# Patient Record
Sex: Female | Born: 1945
Health system: Southern US, Community
[De-identification: ages and names within clinical notes are randomized; demographics above are authoritative.]

## PROBLEM LIST (undated history)

## (undated) DIAGNOSIS — I10 Essential (primary) hypertension: Secondary | ICD-10-CM

## (undated) DIAGNOSIS — M51369 Other intervertebral disc degeneration, lumbar region without mention of lumbar back pain or lower extremity pain: Secondary | ICD-10-CM

## (undated) DIAGNOSIS — M5136 Other intervertebral disc degeneration, lumbar region: Secondary | ICD-10-CM

## (undated) DIAGNOSIS — E785 Hyperlipidemia, unspecified: Secondary | ICD-10-CM

## (undated) DIAGNOSIS — J45909 Unspecified asthma, uncomplicated: Secondary | ICD-10-CM

## (undated) DIAGNOSIS — G4730 Sleep apnea, unspecified: Secondary | ICD-10-CM

## (undated) DIAGNOSIS — M1990 Unspecified osteoarthritis, unspecified site: Secondary | ICD-10-CM

## (undated) HISTORY — DX: Other intervertebral disc degeneration, lumbar region: M51.36

## (undated) HISTORY — DX: Unspecified osteoarthritis, unspecified site: M19.90

## (undated) HISTORY — PX: COLONOSCOPY: SHX174

## (undated) HISTORY — PX: BREAST BIOPSY: SHX20

## (undated) HISTORY — PX: OTHER SURGICAL HISTORY: SHX169

## (undated) HISTORY — DX: Unspecified asthma, uncomplicated: J45.909

## (undated) HISTORY — DX: Essential (primary) hypertension: I10

## (undated) HISTORY — DX: Other intervertebral disc degeneration, lumbar region without mention of lumbar back pain or lower extremity pain: M51.369

## (undated) HISTORY — DX: Hyperlipidemia, unspecified: E78.5

## (undated) HISTORY — PX: DG THUMB LEFT HAND: HXRAD1658

---
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
## 2013-07-30 HISTORY — PX: BUNIONECTOMY: SHX129

## 2015-05-25 ENCOUNTER — Ambulatory Visit (HOSPITAL_COMMUNITY)
Admission: RE | Admit: 2015-05-25 | Discharge: 2015-05-25 | Disposition: A | Discharge status: Home / Self Care | Payer: BLUE CROSS/BLUE SHIELD | Source: Ambulatory Visit | Attending: Family Medicine | Admitting: Family Medicine

## 2015-05-25 ENCOUNTER — Other Ambulatory Visit (HOSPITAL_COMMUNITY): Payer: Self-pay | Admitting: Family Medicine

## 2015-05-25 DIAGNOSIS — Z87891 Personal history of nicotine dependence: Secondary | ICD-10-CM | POA: Diagnosis not present

## 2015-05-25 DIAGNOSIS — J449 Chronic obstructive pulmonary disease, unspecified: Secondary | ICD-10-CM | POA: Insufficient documentation

## 2015-06-07 ENCOUNTER — Other Ambulatory Visit (HOSPITAL_COMMUNITY): Payer: Self-pay | Admitting: Family Medicine

## 2015-06-07 DIAGNOSIS — Z1231 Encounter for screening mammogram for malignant neoplasm of breast: Secondary | ICD-10-CM

## 2015-06-08 ENCOUNTER — Ambulatory Visit (HOSPITAL_COMMUNITY): Payer: BLUE CROSS/BLUE SHIELD

## 2015-06-08 ENCOUNTER — Ambulatory Visit (HOSPITAL_COMMUNITY)
Admission: RE | Admit: 2015-06-08 | Discharge: 2015-06-08 | Disposition: A | Discharge status: Home / Self Care | Payer: BLUE CROSS/BLUE SHIELD | Source: Ambulatory Visit | Attending: Family Medicine | Admitting: Family Medicine

## 2015-06-08 DIAGNOSIS — Z1231 Encounter for screening mammogram for malignant neoplasm of breast: Secondary | ICD-10-CM | POA: Diagnosis not present

## 2015-06-14 ENCOUNTER — Other Ambulatory Visit: Payer: Self-pay | Admitting: Family Medicine

## 2015-06-14 DIAGNOSIS — R928 Other abnormal and inconclusive findings on diagnostic imaging of breast: Secondary | ICD-10-CM

## 2015-06-21 ENCOUNTER — Ambulatory Visit
Admission: RE | Admit: 2015-06-21 | Discharge: 2015-06-21 | Disposition: A | Discharge status: Home / Self Care | Payer: BLUE CROSS/BLUE SHIELD | Source: Ambulatory Visit | Attending: Family Medicine | Admitting: Family Medicine

## 2015-06-21 DIAGNOSIS — R928 Other abnormal and inconclusive findings on diagnostic imaging of breast: Secondary | ICD-10-CM

## 2015-07-05 ENCOUNTER — Ambulatory Visit: Payer: Self-pay | Admitting: Allergy and Immunology

## 2015-09-07 DIAGNOSIS — H578 Other specified disorders of eye and adnexa: Secondary | ICD-10-CM | POA: Diagnosis not present

## 2015-10-05 DIAGNOSIS — I10 Essential (primary) hypertension: Secondary | ICD-10-CM | POA: Diagnosis not present

## 2015-10-05 DIAGNOSIS — H01005 Unspecified blepharitis left lower eyelid: Secondary | ICD-10-CM | POA: Diagnosis not present

## 2015-10-05 DIAGNOSIS — E663 Overweight: Secondary | ICD-10-CM | POA: Diagnosis not present

## 2015-10-05 DIAGNOSIS — Z008 Encounter for other general examination: Secondary | ICD-10-CM | POA: Diagnosis not present

## 2015-10-05 DIAGNOSIS — E785 Hyperlipidemia, unspecified: Secondary | ICD-10-CM | POA: Diagnosis not present

## 2015-10-06 DIAGNOSIS — E785 Hyperlipidemia, unspecified: Secondary | ICD-10-CM | POA: Diagnosis not present

## 2015-10-06 DIAGNOSIS — E663 Overweight: Secondary | ICD-10-CM | POA: Diagnosis not present

## 2015-10-06 DIAGNOSIS — I10 Essential (primary) hypertension: Secondary | ICD-10-CM | POA: Diagnosis not present

## 2015-11-14 DIAGNOSIS — H6503 Acute serous otitis media, bilateral: Secondary | ICD-10-CM | POA: Diagnosis not present

## 2015-11-14 DIAGNOSIS — J00 Acute nasopharyngitis [common cold]: Secondary | ICD-10-CM | POA: Diagnosis not present

## 2016-01-04 DIAGNOSIS — I10 Essential (primary) hypertension: Secondary | ICD-10-CM | POA: Diagnosis not present

## 2016-01-04 DIAGNOSIS — G5601 Carpal tunnel syndrome, right upper limb: Secondary | ICD-10-CM | POA: Diagnosis not present

## 2016-01-04 DIAGNOSIS — M5441 Lumbago with sciatica, right side: Secondary | ICD-10-CM | POA: Diagnosis not present

## 2016-01-05 ENCOUNTER — Other Ambulatory Visit (HOSPITAL_COMMUNITY): Payer: Self-pay | Admitting: Family Medicine

## 2016-01-05 ENCOUNTER — Ambulatory Visit (HOSPITAL_COMMUNITY)
Admission: RE | Admit: 2016-01-05 | Discharge: 2016-01-05 | Disposition: A | Discharge status: Home / Self Care | Payer: PPO | Source: Ambulatory Visit | Attending: Family Medicine | Admitting: Family Medicine

## 2016-01-05 DIAGNOSIS — M5136 Other intervertebral disc degeneration, lumbar region: Secondary | ICD-10-CM | POA: Diagnosis not present

## 2016-01-05 DIAGNOSIS — R921 Mammographic calcification found on diagnostic imaging of breast: Secondary | ICD-10-CM

## 2016-01-05 DIAGNOSIS — M5441 Lumbago with sciatica, right side: Secondary | ICD-10-CM | POA: Diagnosis not present

## 2016-01-17 ENCOUNTER — Ambulatory Visit (HOSPITAL_COMMUNITY)
Admission: RE | Admit: 2016-01-17 | Discharge: 2016-01-17 | Disposition: A | Discharge status: Home / Self Care | Payer: PPO | Source: Ambulatory Visit | Attending: Family Medicine | Admitting: Family Medicine

## 2016-01-17 DIAGNOSIS — R921 Mammographic calcification found on diagnostic imaging of breast: Secondary | ICD-10-CM | POA: Diagnosis not present

## 2016-02-15 DIAGNOSIS — G5601 Carpal tunnel syndrome, right upper limb: Secondary | ICD-10-CM | POA: Diagnosis not present

## 2016-03-05 DIAGNOSIS — G5601 Carpal tunnel syndrome, right upper limb: Secondary | ICD-10-CM | POA: Diagnosis not present

## 2016-03-30 DIAGNOSIS — G5601 Carpal tunnel syndrome, right upper limb: Secondary | ICD-10-CM | POA: Diagnosis not present

## 2016-05-30 ENCOUNTER — Other Ambulatory Visit: Payer: Self-pay | Admitting: Family Medicine

## 2016-05-30 DIAGNOSIS — R921 Mammographic calcification found on diagnostic imaging of breast: Secondary | ICD-10-CM

## 2016-06-12 DIAGNOSIS — H40033 Anatomical narrow angle, bilateral: Secondary | ICD-10-CM | POA: Diagnosis not present

## 2016-06-12 DIAGNOSIS — H04123 Dry eye syndrome of bilateral lacrimal glands: Secondary | ICD-10-CM | POA: Diagnosis not present

## 2016-07-03 ENCOUNTER — Ambulatory Visit
Admission: RE | Admit: 2016-07-03 | Discharge: 2016-07-03 | Disposition: A | Discharge status: Home / Self Care | Payer: Medicare Other | Source: Ambulatory Visit | Attending: Family Medicine | Admitting: Family Medicine

## 2016-07-03 ENCOUNTER — Other Ambulatory Visit (HOSPITAL_COMMUNITY): Payer: Self-pay | Admitting: Family Medicine

## 2016-07-03 DIAGNOSIS — R921 Mammographic calcification found on diagnostic imaging of breast: Secondary | ICD-10-CM | POA: Diagnosis not present

## 2016-07-03 DIAGNOSIS — E785 Hyperlipidemia, unspecified: Secondary | ICD-10-CM | POA: Diagnosis not present

## 2016-07-03 DIAGNOSIS — E663 Overweight: Secondary | ICD-10-CM | POA: Diagnosis not present

## 2016-07-03 DIAGNOSIS — J4520 Mild intermittent asthma, uncomplicated: Secondary | ICD-10-CM | POA: Diagnosis not present

## 2016-07-03 DIAGNOSIS — Z780 Asymptomatic menopausal state: Secondary | ICD-10-CM

## 2016-07-03 DIAGNOSIS — Z23 Encounter for immunization: Secondary | ICD-10-CM | POA: Diagnosis not present

## 2016-07-03 DIAGNOSIS — I10 Essential (primary) hypertension: Secondary | ICD-10-CM | POA: Diagnosis not present

## 2016-07-11 ENCOUNTER — Other Ambulatory Visit (HOSPITAL_COMMUNITY): Payer: Self-pay | Admitting: Family Medicine

## 2016-07-11 ENCOUNTER — Ambulatory Visit (HOSPITAL_COMMUNITY)
Admission: RE | Admit: 2016-07-11 | Discharge: 2016-07-11 | Disposition: A | Discharge status: Home / Self Care | Payer: PPO | Source: Ambulatory Visit | Attending: Family Medicine | Admitting: Family Medicine

## 2016-07-11 DIAGNOSIS — M25561 Pain in right knee: Secondary | ICD-10-CM | POA: Insufficient documentation

## 2016-07-11 DIAGNOSIS — M25562 Pain in left knee: Principal | ICD-10-CM

## 2016-07-11 DIAGNOSIS — M170 Bilateral primary osteoarthritis of knee: Secondary | ICD-10-CM | POA: Insufficient documentation

## 2016-07-11 DIAGNOSIS — E785 Hyperlipidemia, unspecified: Secondary | ICD-10-CM | POA: Diagnosis not present

## 2016-07-11 DIAGNOSIS — Z780 Asymptomatic menopausal state: Secondary | ICD-10-CM | POA: Diagnosis not present

## 2016-07-11 DIAGNOSIS — I10 Essential (primary) hypertension: Secondary | ICD-10-CM | POA: Diagnosis not present

## 2016-07-11 DIAGNOSIS — E663 Overweight: Secondary | ICD-10-CM | POA: Diagnosis not present

## 2016-07-11 DIAGNOSIS — Z13820 Encounter for screening for osteoporosis: Secondary | ICD-10-CM | POA: Diagnosis not present

## 2016-07-11 DIAGNOSIS — Z008 Encounter for other general examination: Secondary | ICD-10-CM | POA: Diagnosis not present

## 2016-10-02 DIAGNOSIS — I10 Essential (primary) hypertension: Secondary | ICD-10-CM | POA: Diagnosis not present

## 2016-10-02 DIAGNOSIS — M170 Bilateral primary osteoarthritis of knee: Secondary | ICD-10-CM | POA: Diagnosis not present

## 2016-10-02 DIAGNOSIS — J4520 Mild intermittent asthma, uncomplicated: Secondary | ICD-10-CM | POA: Diagnosis not present

## 2016-10-02 DIAGNOSIS — E785 Hyperlipidemia, unspecified: Secondary | ICD-10-CM | POA: Diagnosis not present

## 2016-12-05 ENCOUNTER — Other Ambulatory Visit (HOSPITAL_COMMUNITY): Payer: Self-pay | Admitting: Family Medicine

## 2016-12-05 ENCOUNTER — Ambulatory Visit (HOSPITAL_COMMUNITY)
Admission: RE | Admit: 2016-12-05 | Discharge: 2016-12-05 | Disposition: A | Discharge status: Home / Self Care | Payer: PPO | Source: Ambulatory Visit | Attending: Family Medicine | Admitting: Family Medicine

## 2016-12-05 DIAGNOSIS — M79661 Pain in right lower leg: Secondary | ICD-10-CM | POA: Diagnosis not present

## 2016-12-05 DIAGNOSIS — R52 Pain, unspecified: Secondary | ICD-10-CM

## 2016-12-05 DIAGNOSIS — M1288 Other specific arthropathies, not elsewhere classified, other specified site: Secondary | ICD-10-CM | POA: Diagnosis not present

## 2016-12-05 DIAGNOSIS — M47817 Spondylosis without myelopathy or radiculopathy, lumbosacral region: Secondary | ICD-10-CM | POA: Insufficient documentation

## 2016-12-05 DIAGNOSIS — M1711 Unilateral primary osteoarthritis, right knee: Secondary | ICD-10-CM | POA: Insufficient documentation

## 2016-12-05 DIAGNOSIS — M1611 Unilateral primary osteoarthritis, right hip: Secondary | ICD-10-CM | POA: Diagnosis not present

## 2016-12-05 DIAGNOSIS — M545 Low back pain: Secondary | ICD-10-CM | POA: Diagnosis not present

## 2016-12-05 DIAGNOSIS — S1096XA Insect bite of unspecified part of neck, initial encounter: Secondary | ICD-10-CM | POA: Diagnosis not present

## 2016-12-05 DIAGNOSIS — G8929 Other chronic pain: Secondary | ICD-10-CM | POA: Insufficient documentation

## 2016-12-05 DIAGNOSIS — G5601 Carpal tunnel syndrome, right upper limb: Secondary | ICD-10-CM | POA: Diagnosis not present

## 2016-12-05 DIAGNOSIS — M160 Bilateral primary osteoarthritis of hip: Secondary | ICD-10-CM | POA: Diagnosis not present

## 2016-12-26 DIAGNOSIS — M5441 Lumbago with sciatica, right side: Secondary | ICD-10-CM | POA: Diagnosis not present

## 2016-12-26 DIAGNOSIS — R079 Chest pain, unspecified: Secondary | ICD-10-CM | POA: Diagnosis not present

## 2016-12-26 DIAGNOSIS — Z733 Stress, not elsewhere classified: Secondary | ICD-10-CM | POA: Diagnosis not present

## 2017-01-11 DIAGNOSIS — J309 Allergic rhinitis, unspecified: Secondary | ICD-10-CM | POA: Insufficient documentation

## 2017-01-11 DIAGNOSIS — E663 Overweight: Secondary | ICD-10-CM | POA: Insufficient documentation

## 2017-01-11 DIAGNOSIS — I10 Essential (primary) hypertension: Secondary | ICD-10-CM | POA: Insufficient documentation

## 2017-01-11 DIAGNOSIS — G5601 Carpal tunnel syndrome, right upper limb: Secondary | ICD-10-CM | POA: Insufficient documentation

## 2017-01-11 DIAGNOSIS — R079 Chest pain, unspecified: Secondary | ICD-10-CM

## 2017-01-11 DIAGNOSIS — M5441 Lumbago with sciatica, right side: Secondary | ICD-10-CM | POA: Insufficient documentation

## 2017-01-11 DIAGNOSIS — J4520 Mild intermittent asthma, uncomplicated: Secondary | ICD-10-CM | POA: Insufficient documentation

## 2017-01-11 DIAGNOSIS — M170 Bilateral primary osteoarthritis of knee: Secondary | ICD-10-CM | POA: Insufficient documentation

## 2017-01-11 DIAGNOSIS — E785 Hyperlipidemia, unspecified: Secondary | ICD-10-CM | POA: Insufficient documentation

## 2017-01-11 DIAGNOSIS — F439 Reaction to severe stress, unspecified: Secondary | ICD-10-CM | POA: Insufficient documentation

## 2017-01-11 DIAGNOSIS — E7800 Pure hypercholesterolemia, unspecified: Secondary | ICD-10-CM | POA: Insufficient documentation

## 2017-01-14 NOTE — Progress Notes (Signed)
Cardiology Office Note   Date:  01/15/2017   ID:  Jasmine Rhodes, DOB Dec 05, 1945, MRN [id number]  PCP:  Gareth MorganKnowlton, Steve, MD  Cardiologist:   Charlton HawsPeter Durward Matranga, MD   No chief complaint on file.     History of Present Illness: Jasmine Rhodes is a 71 y.o. female who presents for consultation regarding abnormal ECG Referred by Dr Sudie BaileyKnowlton.   CRF;s include HTN, HL, and central obesity. Reviewed his office notes from 12/26/16 Noted atypical SSCP.  Pain is fleeting Sharp pins/needles down left arm Some pain in legs with walking right greater than right but seems arthritic On prednisone Now for right knee pain  Tried to review ECG from his office but leads are not labelled and format is not the usual 12 lead. To my eye Major finding is low voltage In our office today ECG  NSR totally normal rate 68    [place] lived in [place] years moved here to care for her 70 yo mom   Past Medical History:  Diagnosis Date  . Hypertension     Past Surgical History:  Procedure Laterality Date  . COLONOSCOPY    . tonsils removal       Current Outpatient Prescriptions  Medication Sig Dispense Refill  . albuterol (PROVENTIL) (2.5 MG/3ML) 0.083% nebulizer solution Take 2.5 mg by nebulization every 6 (six) hours as needed for wheezing or shortness of breath.    Marland Kitchen. aspirin EC 81 MG tablet Take 81 mg by mouth daily.    Marland Kitchen. atenolol (TENORMIN) 50 MG tablet Take 50 mg by mouth daily.    . cetirizine (ZYRTEC) 10 MG tablet Take 10 mg by mouth daily.    . fluticasone (FLONASE) 50 MCG/ACT nasal spray Place into both nostrils daily.    Marland Kitchen. oxymetazoline (AFRIN) 0.05 % nasal spray Place 1 spray into both nostrils 2 (two) times daily.    . pravastatin (PRAVACHOL) 20 MG tablet Take 20 mg by mouth daily.    Marland Kitchen. triamterene-hydrochlorothiazide (DYAZIDE) 37.5-25 MG capsule Take 1 capsule by mouth daily.     No current facility-administered medications for this visit.     Allergies:   Sulfa antibiotics    Social  History:  The patient  reports that she has been smoking.  She has never used smokeless tobacco. She reports that she drinks alcohol. She reports that she does not use drugs.   Family History:  The patient's family history includes Hypertension in her mother. Maternal grandma With CVA    ROS:  Please see the history of present illness.   Otherwise, review of systems are positive for none.   All other systems are reviewed and negative.    PHYSICAL EXAM: VS:  BP 128/78 (BP Location: Left Arm)   Pulse 74   Ht 5\' 5"  (1.651 m)   Wt 70.8 kg (156 lb)   SpO2 99%   BMI 25.96 kg/m  , BMI Body mass index is 25.96 kg/m. Affect appropriate Healthy:  appears stated age HEENT: normal Neck supple with no adenopathy JVP normal no bruits no thyromegaly Lungs clear with no wheezing and good diaphragmatic motion Heart:  S1/S2 no murmur, no rub, gallop or click PMI normal Abdomen: benighn, BS positve, no tenderness, no AAA no bruit.  No HSM or HJR Distal pulses intact with no bruits No edema Neuro non-focal Skin warm and dry No muscular weakness    EKG:  See HPI above 01/15/17 in our office    Recent Labs: No results found  for requested labs within last 8760 hours.    Lipid Panel No results found for: CHOL, TRIG, HDL, CHOLHDL, VLDL, LDLCALC, LDLDIRECT    Wt Readings from Last 3 Encounters:  01/15/17 70.8 kg (156 lb)      Other studies Reviewed: Additional studies/ records that were reviewed today include: Notes Dr Sudie Bailey and ECG.    ASSESSMENT AND PLAN:  1.  Abnormal ECG ? Related to office equipment Our ECG is totally normal  She is interested in ETT which is simple and thinks she can walk on treadmill to r/o CAD 2. HTN Well controlled.  Continue current medications and low sodium Dash type diet.   3. Cholesterol continue statin labs with primary  4. Obesity  Discussed low carb diet and walking    Current medicines are reviewed at length with the patient today.  The  patient does not have concerns regarding medicines.  The following changes have been made:  no change  Labs/ tests ordered today include: ETT  Orders Placed This Encounter  Procedures  . Exercise Tolerance Test  . EKG 12-Lead     Disposition:   FU with cardiology PRN     Signed, Charlton Haws, MD  01/15/2017 9:32 AM    [facility] [address] [place], [place], [place]  [address] Phone: [phone]; Fax: [phone]

## 2017-01-15 ENCOUNTER — Ambulatory Visit (INDEPENDENT_AMBULATORY_CARE_PROVIDER_SITE_OTHER): Payer: PPO | Admitting: Cardiovascular Disease

## 2017-01-15 ENCOUNTER — Encounter: Payer: Self-pay | Admitting: Cardiovascular Disease

## 2017-01-15 VITALS — BP 128/78 | HR 74 | Ht 65.0 in | Wt 156.0 lb

## 2017-01-15 DIAGNOSIS — I10 Essential (primary) hypertension: Secondary | ICD-10-CM | POA: Diagnosis not present

## 2017-01-15 DIAGNOSIS — R9431 Abnormal electrocardiogram [ECG] [EKG]: Secondary | ICD-10-CM | POA: Diagnosis not present

## 2017-01-15 NOTE — Patient Instructions (Signed)
Medication Instructions:  Your physician recommends that you continue on your current medications as directed. Please refer to the Current Medication list given to you today.   Labwork: NONE  Testing/Procedures: Your physician has requested that you have an exercise tolerance test. For further information please visit [email or web address]. Please also follow instruction sheet, as given.    Follow-Up: Your physician recommends that you schedule a follow-up appointment in: AS NEEDED    Any Other Special Instructions Will Be Listed Below (If Applicable).     If you need a refill on your cardiac medications before your next appointment, please call your pharmacy.   

## 2017-01-16 ENCOUNTER — Telehealth: Payer: Self-pay

## 2017-01-16 ENCOUNTER — Ambulatory Visit (HOSPITAL_COMMUNITY)
Admission: RE | Admit: 2017-01-16 | Discharge: 2017-01-16 | Disposition: A | Discharge status: Home / Self Care | Payer: PPO | Source: Ambulatory Visit | Attending: Cardiovascular Disease | Admitting: Cardiovascular Disease

## 2017-01-16 DIAGNOSIS — R9431 Abnormal electrocardiogram [ECG] [EKG]: Secondary | ICD-10-CM | POA: Insufficient documentation

## 2017-01-16 DIAGNOSIS — I10 Essential (primary) hypertension: Secondary | ICD-10-CM | POA: Insufficient documentation

## 2017-01-16 LAB — EXERCISE TOLERANCE TEST
CHL CUP RESTING HR STRESS: 81 {beats}/min
CSEPED: 6 min
CSEPEDS: 1 s
CSEPEW: 7 METS
CSEPHR: 100 %
CSEPPHR: 151 {beats}/min
MPHR: 151 {beats}/min
RPE: 13

## 2017-01-16 NOTE — Telephone Encounter (Signed)
Called pt, no answer- left  Message for pt to return call.

## 2017-01-29 ENCOUNTER — Telehealth: Payer: Self-pay

## 2017-02-19 DIAGNOSIS — R1010 Upper abdominal pain, unspecified: Secondary | ICD-10-CM | POA: Diagnosis not present

## 2017-02-19 DIAGNOSIS — J301 Allergic rhinitis due to pollen: Secondary | ICD-10-CM | POA: Diagnosis not present

## 2017-02-19 DIAGNOSIS — G4733 Obstructive sleep apnea (adult) (pediatric): Secondary | ICD-10-CM | POA: Diagnosis not present

## 2017-02-19 DIAGNOSIS — F17200 Nicotine dependence, unspecified, uncomplicated: Secondary | ICD-10-CM | POA: Diagnosis not present

## 2017-02-20 DIAGNOSIS — J301 Allergic rhinitis due to pollen: Secondary | ICD-10-CM | POA: Diagnosis not present

## 2017-02-20 DIAGNOSIS — I10 Essential (primary) hypertension: Secondary | ICD-10-CM | POA: Diagnosis not present

## 2017-02-20 DIAGNOSIS — R1010 Upper abdominal pain, unspecified: Secondary | ICD-10-CM | POA: Diagnosis not present

## 2017-02-20 DIAGNOSIS — E785 Hyperlipidemia, unspecified: Secondary | ICD-10-CM | POA: Diagnosis not present

## 2017-03-06 DIAGNOSIS — G5610 Other lesions of median nerve, unspecified upper limb: Secondary | ICD-10-CM | POA: Diagnosis not present

## 2017-03-11 ENCOUNTER — Other Ambulatory Visit: Payer: Self-pay

## 2017-03-11 ENCOUNTER — Encounter: Payer: Self-pay | Admitting: Gastroenterology

## 2017-03-11 ENCOUNTER — Ambulatory Visit (INDEPENDENT_AMBULATORY_CARE_PROVIDER_SITE_OTHER): Payer: PPO | Admitting: Gastroenterology

## 2017-03-11 ENCOUNTER — Ambulatory Visit: Payer: PPO | Admitting: Nurse Practitioner

## 2017-03-11 DIAGNOSIS — R1013 Epigastric pain: Secondary | ICD-10-CM | POA: Diagnosis not present

## 2017-03-11 DIAGNOSIS — R1031 Right lower quadrant pain: Secondary | ICD-10-CM | POA: Diagnosis not present

## 2017-03-11 DIAGNOSIS — Z860100 Personal history of colon polyps, unspecified: Secondary | ICD-10-CM

## 2017-03-11 DIAGNOSIS — M5441 Lumbago with sciatica, right side: Secondary | ICD-10-CM | POA: Diagnosis not present

## 2017-03-11 DIAGNOSIS — R1319 Other dysphagia: Secondary | ICD-10-CM

## 2017-03-11 DIAGNOSIS — R1310 Dysphagia, unspecified: Secondary | ICD-10-CM

## 2017-03-11 DIAGNOSIS — G8929 Other chronic pain: Secondary | ICD-10-CM | POA: Insufficient documentation

## 2017-03-11 MED ORDER — NA SULFATE-K SULFATE-MG SULF 17.5-3.13-1.6 GM/177ML PO SOLN: ORAL | 0 refills | Status: DC

## 2017-03-11 NOTE — Patient Instructions (Signed)
1. Colonoscopy and upper endoscopy as scheduled. See separate instructions.  

## 2017-03-11 NOTE — Progress Notes (Addendum)
REVIEWED-NO ADDITIONAL RECOMMENDATIONS.  Primary Care Physician:  Gareth MorganKnowlton, Steve, MD  Primary Gastroenterologist:  Jonette EvaSandi Fields, MD   Chief Complaint  Patient presents with  . Colonoscopy    consult  . Abdominal Pain    right lower, intermittent  . Nausea    HPI:  Jasmine Rhodes is a 71 y.o. female here to schedule colonoscopy. She's had some right lower quadrant pain as well as some upper GI symptoms. She states her last colonoscopy was around 2005, but when she called to get records they could find one only in 2001. She believes she had colon polyps with her first one. Either way sounds like she is due.  She's had intermittent right lower quadrant abdominal pain over the past several months. Unrelated to meals or BMs. May last for minutes to hours at a time, not all day. Bowel movements are regular. Sometimes has a lot of gas. No melena rectal bleeding. She also complains of epigastric discomfort with meals. Solid food esophageal dysphagia. No vomiting. She's had some nausea. No heartburn. Remote EGD.  Takes aspirin daily but no other NSAIDs on a regular basis. She takes Aleve on occasion for knee and back pain but can go couple months in between dosages.  Recent labs were good. See below.  Current Outpatient Prescriptions  Medication Sig Dispense Refill  . albuterol (PROVENTIL) (2.5 MG/3ML) 0.083% nebulizer solution Take 2.5 mg by nebulization every 6 (six) hours as needed for wheezing or shortness of breath.    Marland Kitchen. aspirin EC 81 MG tablet Take 81 mg by mouth daily.    Marland Kitchen. atenolol (TENORMIN) 50 MG tablet Take 50 mg by mouth daily.    . cetirizine (ZYRTEC) 10 MG tablet Take 10 mg by mouth as needed.     . fluticasone (FLONASE) 50 MCG/ACT nasal spray Place into both nostrils as needed.     Marland Kitchen. GLUCOSAMINE-CHONDROITIN PO Take by mouth as needed.    . naproxen sodium (ANAPROX) 220 MG tablet Take 220 mg by mouth as needed.    Marland Kitchen. oxymetazoline (AFRIN) 0.05 % nasal spray Place 1 spray into  both nostrils as needed.     . pravastatin (PRAVACHOL) 20 MG tablet Take 20 mg by mouth daily.    Marland Kitchen. triamterene-hydrochlorothiazide (DYAZIDE) 37.5-25 MG capsule Take 1 capsule by mouth daily.     No current facility-administered medications for this visit.     Allergies as of 03/11/2017 - Review Complete 03/11/2017  Allergen Reaction Noted  . Sulfa antibiotics  01/11/2017    Past Medical History:  Diagnosis Date  . Asthma   . DDD (degenerative disc disease), lumbar   . Hyperlipidemia   . Hypertension   . Osteoarthritis     Past Surgical History:  Procedure Laterality Date  . COLONOSCOPY    . DG THUMB LEFT HAND    . tonsils removal      Family History  Problem Relation Age of Onset  . Hypertension Mother   . Colon cancer Neg Hx     Social History   Social History  . Marital status: Divorced    Spouse name: N/A  . Number of children: N/A  . Years of education: N/A   Occupational History  . Not on file.   Social History Main Topics  . Smoking status: Current Every Day Smoker  . Smokeless tobacco: Never Used  . Alcohol use Yes     Comment: occasional  . Drug use: No  . Sexual activity: Not on file  Other Topics Concern  . Not on file   Social History Narrative  . No narrative on file      ROS:  General: Negative for anorexia, weight loss, fever, chills, fatigue, weakness. Eyes: Negative for vision changes.  ENT: Negative for hoarseness, difficulty swallowing , nasal congestion. CV: Negative for chest pain, angina, palpitations, dyspnea on exertion, peripheral edema.  Respiratory: Negative for dyspnea at rest, dyspnea on exertion, cough, sputum, wheezing.  GI: See history of present illness. GU:  Negative for dysuria, hematuria, urinary incontinence, urinary frequency, nocturnal urination.  MS: Negative for joint pain, low back pain.  Derm: Negative for rash or itching.  Neuro: Negative for weakness, abnormal sensation, seizure, frequent headaches,  memory loss, confusion.  Psych: Negative for anxiety, depression, suicidal ideation, hallucinations.  Endo: Negative for unusual weight change.  Heme: Negative for bruising or bleeding. Allergy: Negative for rash or hives.    Physical Examination:  BP 113/74   Pulse 69   Temp (!) 97.1 F (36.2 C) (Oral)   Ht 5\' 5"  (1.651 m)   Wt 153 lb 3.2 oz (69.5 kg)   BMI 25.49 kg/m    General: Well-nourished, well-developed in no acute distress.  Head: Normocephalic, atraumatic.   Eyes: Conjunctiva pink, no icterus. Mouth: Oropharyngeal mucosa moist and pink , no lesions erythema or exudate. Neck: Supple without thyromegaly, masses, or lymphadenopathy.  Lungs: Clear to auscultation bilaterally.  Heart: Regular rate and rhythm, no murmurs rubs or gallops.  Abdomen: Bowel sounds are normal, mild epigastric tenderness, nondistended, no hepatosplenomegaly or masses, no abdominal bruits or    hernia , no rebound or guarding.   Rectal: Not performed Extremities: No lower extremity edema. No clubbing or deformities.  Neuro: Alert and oriented x 4 , grossly normal neurologically.  Skin: Warm and dry, no rash or jaundice.   Psych: Alert and cooperative, normal mood and affect.  Labs: Labs from 02/20/2017 BUN 19, creatinine 0.69, glucose 102, total bilirubin 0.4, alkaline phosphatase 52, AST 14, ALT 9, albumin 3.9, sedimentation rate 11, white blood cell count 4800, hemoglobin 14, hematocrit 41.3, platelets 252,000. H. pylori breath test not detected.  Imaging Studies: No results found.  Impression/plan: 71 year old female presenting with complaints of intermittent right lower quadrant pain, postprandial epigastric discomfort, esophageal dysphagia with prior history of colon polyps. Recent labs unremarkable. No reported unintentional weight loss. No significant right lower quadrant pain on exam today, appendicitis less likely. Overdue for colonoscopy.  Plan for colonoscopy in the near future. Plan  for upper endoscopy with possible esophageal dilation given postprandial epigastric discomfort and esophageal dysphagia to solid foods.  I have discussed the risks, alternatives, benefits with regards to but not limited to the risk of reaction to medication, bleeding, infection, perforation and the patient is agreeable to proceed. Written consent to be obtained.

## 2017-03-12 NOTE — Progress Notes (Signed)
cc'ed to pcp °

## 2017-03-26 ENCOUNTER — Ambulatory Visit: Payer: PPO | Admitting: Nurse Practitioner

## 2017-03-27 ENCOUNTER — Other Ambulatory Visit (HOSPITAL_COMMUNITY): Payer: Self-pay | Admitting: Family Medicine

## 2017-03-27 DIAGNOSIS — R1031 Right lower quadrant pain: Secondary | ICD-10-CM

## 2017-03-27 DIAGNOSIS — M5441 Lumbago with sciatica, right side: Principal | ICD-10-CM

## 2017-03-27 DIAGNOSIS — G8929 Other chronic pain: Secondary | ICD-10-CM

## 2017-03-30 HISTORY — PX: CARPAL TUNNEL RELEASE: SHX101

## 2017-04-10 ENCOUNTER — Ambulatory Visit (HOSPITAL_COMMUNITY): Payer: PPO

## 2017-04-11 DIAGNOSIS — G5601 Carpal tunnel syndrome, right upper limb: Secondary | ICD-10-CM | POA: Diagnosis not present

## 2017-04-16 ENCOUNTER — Ambulatory Visit (HOSPITAL_COMMUNITY)
Admission: RE | Admit: 2017-04-16 | Discharge: 2017-04-16 | Disposition: A | Discharge status: Home / Self Care | Payer: PPO | Source: Ambulatory Visit | Attending: Family Medicine | Admitting: Family Medicine

## 2017-04-16 DIAGNOSIS — I700 Atherosclerosis of aorta: Secondary | ICD-10-CM | POA: Diagnosis not present

## 2017-04-16 DIAGNOSIS — R1031 Right lower quadrant pain: Secondary | ICD-10-CM | POA: Diagnosis not present

## 2017-04-16 DIAGNOSIS — M48061 Spinal stenosis, lumbar region without neurogenic claudication: Secondary | ICD-10-CM | POA: Diagnosis not present

## 2017-04-16 DIAGNOSIS — M5137 Other intervertebral disc degeneration, lumbosacral region: Secondary | ICD-10-CM | POA: Diagnosis not present

## 2017-04-16 DIAGNOSIS — K760 Fatty (change of) liver, not elsewhere classified: Secondary | ICD-10-CM | POA: Insufficient documentation

## 2017-04-16 DIAGNOSIS — M5441 Lumbago with sciatica, right side: Principal | ICD-10-CM

## 2017-04-16 DIAGNOSIS — G8929 Other chronic pain: Secondary | ICD-10-CM

## 2017-04-16 DIAGNOSIS — I2510 Atherosclerotic heart disease of native coronary artery without angina pectoris: Secondary | ICD-10-CM | POA: Insufficient documentation

## 2017-04-16 LAB — POCT I-STAT CREATININE: Creatinine, Ser: 0.7 mg/dL (ref 0.44–1.00)

## 2017-04-16 MED ORDER — IOPAMIDOL (ISOVUE-300) INJECTION 61%
100.0000 mL | Freq: Once | INTRAVENOUS | Status: AC | PRN
  Administered 2017-04-16: 100 mL via INTRAVENOUS

## 2017-04-25 ENCOUNTER — Encounter (HOSPITAL_COMMUNITY)
Admission: RE | Disposition: A | Discharge status: Home / Self Care | Payer: Self-pay | Source: Ambulatory Visit | Attending: Gastroenterology

## 2017-04-25 ENCOUNTER — Ambulatory Visit (HOSPITAL_COMMUNITY)
Admission: RE | Admit: 2017-04-25 | Discharge: 2017-04-25 | Disposition: A | Discharge status: Home / Self Care | Payer: PPO | Source: Ambulatory Visit | Attending: Gastroenterology | Admitting: Gastroenterology

## 2017-04-25 ENCOUNTER — Encounter (HOSPITAL_COMMUNITY): Payer: Self-pay | Admitting: *Deleted

## 2017-04-25 DIAGNOSIS — K2990 Gastroduodenitis, unspecified, without bleeding: Secondary | ICD-10-CM

## 2017-04-25 DIAGNOSIS — E785 Hyperlipidemia, unspecified: Secondary | ICD-10-CM | POA: Diagnosis not present

## 2017-04-25 DIAGNOSIS — K648 Other hemorrhoids: Secondary | ICD-10-CM | POA: Diagnosis not present

## 2017-04-25 DIAGNOSIS — M1990 Unspecified osteoarthritis, unspecified site: Secondary | ICD-10-CM | POA: Diagnosis not present

## 2017-04-25 DIAGNOSIS — K644 Residual hemorrhoidal skin tags: Secondary | ICD-10-CM | POA: Diagnosis not present

## 2017-04-25 DIAGNOSIS — K2980 Duodenitis without bleeding: Secondary | ICD-10-CM | POA: Insufficient documentation

## 2017-04-25 DIAGNOSIS — F17210 Nicotine dependence, cigarettes, uncomplicated: Secondary | ICD-10-CM | POA: Insufficient documentation

## 2017-04-25 DIAGNOSIS — Z8719 Personal history of other diseases of the digestive system: Secondary | ICD-10-CM | POA: Diagnosis not present

## 2017-04-25 DIAGNOSIS — Z1212 Encounter for screening for malignant neoplasm of rectum: Secondary | ICD-10-CM

## 2017-04-25 DIAGNOSIS — R1310 Dysphagia, unspecified: Secondary | ICD-10-CM

## 2017-04-25 DIAGNOSIS — Z882 Allergy status to sulfonamides status: Secondary | ICD-10-CM | POA: Insufficient documentation

## 2017-04-25 DIAGNOSIS — Q438 Other specified congenital malformations of intestine: Secondary | ICD-10-CM | POA: Insufficient documentation

## 2017-04-25 DIAGNOSIS — Z79891 Long term (current) use of opiate analgesic: Secondary | ICD-10-CM | POA: Diagnosis not present

## 2017-04-25 DIAGNOSIS — I10 Essential (primary) hypertension: Secondary | ICD-10-CM | POA: Diagnosis not present

## 2017-04-25 DIAGNOSIS — Z79899 Other long term (current) drug therapy: Secondary | ICD-10-CM | POA: Diagnosis not present

## 2017-04-25 DIAGNOSIS — R1031 Right lower quadrant pain: Secondary | ICD-10-CM

## 2017-04-25 DIAGNOSIS — Z1211 Encounter for screening for malignant neoplasm of colon: Secondary | ICD-10-CM

## 2017-04-25 DIAGNOSIS — K2950 Unspecified chronic gastritis without bleeding: Secondary | ICD-10-CM | POA: Insufficient documentation

## 2017-04-25 DIAGNOSIS — K2970 Gastritis, unspecified, without bleeding: Secondary | ICD-10-CM

## 2017-04-25 DIAGNOSIS — J45909 Unspecified asthma, uncomplicated: Secondary | ICD-10-CM | POA: Diagnosis not present

## 2017-04-25 DIAGNOSIS — Z7982 Long term (current) use of aspirin: Secondary | ICD-10-CM | POA: Diagnosis not present

## 2017-04-25 DIAGNOSIS — Z86010 Personal history of colonic polyps: Secondary | ICD-10-CM

## 2017-04-25 DIAGNOSIS — K222 Esophageal obstruction: Secondary | ICD-10-CM

## 2017-04-25 HISTORY — PX: COLONOSCOPY: SHX5424

## 2017-04-25 HISTORY — PX: SAVORY DILATION: SHX5439

## 2017-04-25 HISTORY — PX: ESOPHAGOGASTRODUODENOSCOPY: SHX5428

## 2017-04-25 SURGERY — COLONOSCOPY
Anesthesia: Moderate Sedation

## 2017-04-25 MED ORDER — MEPERIDINE HCL 100 MG/ML IJ SOLN
INTRAMUSCULAR | Status: AC
  Filled 2017-04-25: qty 2

## 2017-04-25 MED ORDER — MIDAZOLAM HCL 5 MG/5ML IJ SOLN
INTRAMUSCULAR | Status: AC
  Filled 2017-04-25: qty 10

## 2017-04-25 MED ORDER — LIDOCAINE VISCOUS 2 % MT SOLN
OROMUCOSAL | Status: DC | PRN
  Administered 2017-04-25: 1 via OROMUCOSAL

## 2017-04-25 MED ORDER — MIDAZOLAM HCL 5 MG/5ML IJ SOLN
INTRAMUSCULAR | Status: DC | PRN
  Administered 2017-04-25: 2 mg via INTRAVENOUS
  Administered 2017-04-25 (×2): 1 mg via INTRAVENOUS
  Administered 2017-04-25: 2 mg via INTRAVENOUS
  Administered 2017-04-25: 1 mg via INTRAVENOUS

## 2017-04-25 MED ORDER — SODIUM CHLORIDE 0.9 % IV SOLN
INTRAVENOUS | Status: DC
  Administered 2017-04-25: 12:00:00 via INTRAVENOUS

## 2017-04-25 MED ORDER — OMEPRAZOLE 20 MG PO CPDR: DELAYED_RELEASE_CAPSULE | ORAL | 3 refills | Status: DC

## 2017-04-25 MED ORDER — MINERAL OIL PO OIL
TOPICAL_OIL | ORAL | Status: AC
  Filled 2017-04-25: qty 30

## 2017-04-25 MED ORDER — LIDOCAINE VISCOUS 2 % MT SOLN
OROMUCOSAL | Status: AC
  Filled 2017-04-25: qty 15

## 2017-04-25 MED ORDER — MEPERIDINE HCL 100 MG/ML IJ SOLN
INTRAMUSCULAR | Status: DC | PRN
  Administered 2017-04-25 (×4): 25 mg via INTRAVENOUS

## 2017-04-25 NOTE — Discharge Instructions (Signed)
YOU DID NOT HAVE ANY POLYPS. YOU HAVE DIVERTICULOSIS IN YOUR LEFT COLON. I STRETCHED YOUR ESOPHAGUS DUE AN ESOPHAGEAL STRICTURE.  YOUR PROBLEM SWALLOWING IS DUE TO ACID REFLUX, and THE STRICTURE, WHICH IS ALSO DUE TO ACID REFLUX. YOU HAVE GASTRITIS AND DUODENITIS due to aspirin and naproxen use. YOU HAVE A small HIATAL HERNIA. I BIOPSIED YOUR STOMACH.     DRINK WATER TO KEEP YOUR URINE LIGHT YELLOW.  FOLLOW A HIGH FIBER/LOW FAT DIET. AVOID ITEMS THAT CAUSE BLOATING. SEE INFO BELOW.  YOUR BIOPSY RESULTS WILL BE AVAILABLE IN MY CHART AFTER OCT 2 AND MY OFFICE WILL CONTACT YOU IN 10-14 DAYS WITH YOUR RESULTS.   Follow up in DEC 2018.  Dec 5th @ 10:30 am  Next colonoscopy in 10-15 years IF THE BENEFITS OUTWEIGH THE RISKS.    ENDOSCOPY Care After Read the instructions outlined below and refer to this sheet in the next week. These discharge instructions provide you with general information on caring for yourself after you leave the hospital. While your treatment has been planned according to the most current medical practices available, unavoidable complications occasionally occur. If you have any problems or questions after discharge, call DR. FIELDS, [phone].  ACTIVITY  You may resume your regular activity, but move at a slower pace for the next 24 hours.   Take frequent rest periods for the next 24 hours.   Walking will help get rid of the air and reduce the bloated feeling in your belly (abdomen).   No driving for 24 hours (because of the medicine (anesthesia) used during the test).   You may shower.   Do not sign any important legal documents or operate any machinery for 24 hours (because of the anesthesia used during the test).    NUTRITION  Drink plenty of fluids.   You may resume your normal diet as instructed by your doctor.   Begin with a light meal and progress to your normal diet. Heavy or fried foods are harder to digest and may make you feel sick to your stomach  (nauseated).   Avoid alcoholic beverages for 24 hours or as instructed.    MEDICATIONS  You may resume your normal medications.   WHAT YOU CAN EXPECT TODAY  Some feelings of bloating in the abdomen.   Passage of more gas than usual.   Spotting of blood in your stool or on the toilet paper  .  IF YOU HAD POLYPS REMOVED DURING THE ENDOSCOPY:  Eat a soft diet IF YOU HAVE NAUSEA, BLOATING, ABDOMINAL PAIN, OR VOMITING.    FINDING OUT THE RESULTS OF YOUR TEST Not all test results are available during your visit. DR. Darrick Penna WILL CALL YOU WITHIN 14 DAYS OF YOUR PROCEDUE WITH YOUR RESULTS. Do not assume everything is normal if you have not heard from DR. FIELDS, CALL HER OFFICE AT [phone].  SEEK IMMEDIATE MEDICAL ATTENTION AND CALL THE OFFICE: [phone] IF:  You have more than a spotting of blood in your stool.   Your belly is swollen (abdominal distention).   You are nauseated or vomiting.   You have a temperature over 101F.   You have abdominal pain or discomfort that is severe or gets worse throughout the day.   Low-Fat Diet BREADS, CEREALS, PASTA, RICE, DRIED PEAS, AND BEANS These products are high in carbohydrates and most are low in fat. Therefore, they can be increased in the diet as substitutes for fatty foods. They too, however, contain calories and should not be eaten  in excess. Cereals can be eaten for snacks as well as for breakfast.   FRUITS AND VEGETABLES It is good to eat fruits and vegetables. Besides being sources of fiber, both are rich in vitamins and some minerals. They help you get the daily allowances of these nutrients. Fruits and vegetables can be used for snacks and desserts.  MEATS Limit lean meat, chicken, [place], and fish to no more than 6 ounces per day. Beef, Pork, and Lamb Use lean cuts of beef, pork, and lamb. Lean cuts include:  Extra-lean ground beef.  Arm roast.  Sirloin tip.  Center-cut ham.  Round steak.  Loin chops.  Rump  roast.  Tenderloin.  Trim all fat off the outside of meats before cooking. It is not necessary to severely decrease the intake of red meat, but lean choices should be made. Lean meat is rich in protein and contains a highly absorbable form of iron. Premenopausal women, in particular, should avoid reducing lean red meat because this could increase the risk for low red blood cells (iron-deficiency anemia).  Chicken and [place] These are good sources of protein. The fat of poultry can be reduced by removing the skin and underlying fat layers before cooking. Chicken and [place] can be substituted for lean red meat in the diet. Poultry should not be fried or covered with high-fat sauces. Fish and Shellfish Fish is a good source of protein. Shellfish contain cholesterol, but they usually are low in saturated fatty acids. The preparation of fish is important. Like chicken and [place], they should not be fried or covered with high-fat sauces. EGGS Egg whites contain no fat or cholesterol. They can be eaten often. Try 1 to 2 egg whites instead of whole eggs in recipes or use egg substitutes that do not contain yolk. MILK AND DAIRY PRODUCTS Use skim or 1% milk instead of 2% or whole milk. Decrease whole milk, natural, and processed cheeses. Use nonfat or low-fat (2%) cottage cheese or low-fat cheeses made from vegetable oils. Choose nonfat or low-fat (1 to 2%) yogurt. Experiment with evaporated skim milk in recipes that call for heavy cream. Substitute low-fat yogurt or low-fat cottage cheese for sour cream in dips and salad dressings. Have at least 2 servings of low-fat dairy products, such as 2 glasses of skim (or 1%) milk each day to help get your daily calcium intake. FATS AND OILS Reduce the total intake of fats, especially saturated fat. Butterfat, lard, and beef fats are high in saturated fat and cholesterol. These should be avoided as much as possible. Vegetable fats do not contain cholesterol, but certain  vegetable fats, such as coconut oil, palm oil, and palm kernel oil are very high in saturated fats. These should be limited. These fats are often used in bakery goods, processed foods, popcorn, oils, and nondairy creamers. Vegetable shortenings and some peanut butters contain hydrogenated oils, which are also saturated fats. Read the labels on these foods and check for saturated vegetable oils. Unsaturated vegetable oils and fats do not raise blood cholesterol. However, they should be limited because they are fats and are high in calories. Total fat should still be limited to 30% of your daily caloric intake. Desirable liquid vegetable oils are corn oil, cottonseed oil, olive oil, canola oil, safflower oil, soybean oil, and sunflower oil. Peanut oil is not as good, but small amounts are acceptable. Buy a heart-healthy tub margarine that has no partially hydrogenated oils in the ingredients. Mayonnaise and salad dressings often are made from  unsaturated fats, but they should also be limited because of their high calorie and fat content. Seeds, nuts, peanut butter, olives, and avocados are high in fat, but the fat is mainly the unsaturated type. These foods should be limited mainly to avoid excess calories and fat. OTHER EATING TIPS Snacks  Most sweets should be limited as snacks. They tend to be rich in calories and fats, and their caloric content outweighs their nutritional value. Some good choices in snacks are graham crackers, melba toast, soda crackers, bagels (no egg), English muffins, fruits, and vegetables. These snacks are preferable to snack crackers, [place] fries, TORTILLA CHIPS, and POTATO chips. Popcorn should be air-popped or cooked in small amounts of liquid vegetable oil. Desserts Eat fruit, low-fat yogurt, and fruit ices instead of pastries, cake, and cookies. Sherbet, angel food cake, gelatin dessert, frozen low-fat yogurt, or other frozen products that do not contain saturated fat (pure fruit  juice bars, frozen ice pops) are also acceptable.  COOKING METHODS Choose those methods that use little or no fat. They include: Poaching.  Braising.  Steaming.  Grilling.  Baking.  Stir-frying.  Broiling.  Microwaving.  Foods can be cooked in a nonstick pan without added fat, or use a nonfat cooking spray in regular cookware. Limit fried foods and avoid frying in saturated fat. Add moisture to lean meats by using water, broth, cooking wines, and other nonfat or low-fat sauces along with the cooking methods mentioned above. Soups and stews should be chilled after cooking. The fat that forms on top after a few hours in the refrigerator should be skimmed off. When preparing meals, avoid using excess salt. Salt can contribute to raising blood pressure in some people.  EATING AWAY FROM HOME Order entres, potatoes, and vegetables without sauces or butter. When meat exceeds the size of a deck of cards (3 to 4 ounces), the rest can be taken home for another meal. Choose vegetable or fruit salads and ask for low-calorie salad dressings to be served on the side. Use dressings sparingly. Limit high-fat toppings, such as bacon, crumbled eggs, cheese, sunflower seeds, and olives. Ask for heart-healthy tub margarine instead of butter.  High-Fiber Diet A high-fiber diet changes your normal diet to include more whole grains, legumes, fruits, and vegetables. Changes in the diet involve replacing refined carbohydrates with unrefined foods. The calorie level of the diet is essentially unchanged. The Dietary Reference Intake (recommended amount) for adult males is 38 grams per day. For adult females, it is 25 grams per day. Pregnant and lactating women should consume 28 grams of fiber per day. Fiber is the intact part of a plant that is not broken down during digestion. Functional fiber is fiber that has been isolated from the plant to provide a beneficial effect in the body. PURPOSE  Increase stool bulk.    Ease and regulate bowel movements.   Lower cholesterol.   REDUCE RISK OF COLON CANCER  INDICATIONS THAT YOU NEED MORE FIBER  Constipation and hemorrhoids.   Uncomplicated diverticulosis (intestine condition) and irritable bowel syndrome.   Weight management.   As a protective measure against hardening of the arteries (atherosclerosis), diabetes, and cancer.   GUIDELINES FOR INCREASING FIBER IN THE DIET  Start adding fiber to the diet slowly. A gradual increase of about 5 more grams (2 slices of whole-wheat bread, 2 servings of most fruits or vegetables, or 1 bowl of high-fiber cereal) per day is best. Too rapid an increase in fiber may result in constipation, flatulence,  and bloating.   Drink enough water and fluids to keep your urine clear or pale yellow. Water, juice, or caffeine-free drinks are recommended. Not drinking enough fluid may cause constipation.   Eat a variety of high-fiber foods rather than one type of fiber.   Try to increase your intake of fiber through using high-fiber foods rather than fiber pills or supplements that contain small amounts of fiber.   The goal is to change the types of food eaten. Do not supplement your present diet with high-fiber foods, but replace foods in your present diet.   INCLUDE A VARIETY OF FIBER SOURCES  Replace refined and processed grains with whole grains, canned fruits with fresh fruits, and incorporate other fiber sources. White rice, white breads, and most bakery goods contain little or no fiber.   Brown whole-grain rice, buckwheat oats, and many fruits and vegetables are all good sources of fiber. These include: broccoli, Brussels sprouts, cabbage, cauliflower, beets, sweet potatoes, white potatoes (skin on), carrots, tomatoes, eggplant, squash, berries, fresh fruits, and dried fruits.   Cereals appear to be the richest source of fiber. Cereal fiber is found in whole grains and bran. Bran is the fiber-rich outer coat of  cereal grain, which is largely removed in refining. In whole-grain cereals, the bran remains. In breakfast cereals, the largest amount of fiber is found in those with "bran" in their names. The fiber content is sometimes indicated on the label.   You may need to include additional fruits and vegetables each day.   In baking, for 1 cup white flour, you may use the following substitutions:   1 cup whole-wheat flour minus 2 tablespoons.   1/2 cup white flour plus 1/2 cup whole-wheat flour.   Hiatal Hernia A hiatal hernia occurs when a part of the stomach slides above the diaphragm. The diaphragm is the thin muscle separating the belly (abdomen) from the chest. A hiatal hernia can be something you are born with or develop over time. Hiatal hernias may allow stomach acid to flow back into your esophagus, the tube which carries food from your mouth to your stomach. If this acid causes problems it is called GERD (gastro-esophageal reflux disease).   SYMPTOMS Common symptoms of GERD are heartburn (burning in your chest). This is worse when lying down or bending over. It may also cause belching and indigestion. Some of the things which make GERD worse are:  Increased weight pushes on stomach making acid rise more easily.   Smoking markedly increases acid production.   Alcohol decreases lower esophageal sphincter pressure (valve between stomach and esophagus), allowing acid from stomach into esophagus.   Late evening meals and going to bed with a full stomach increases pressure.   HOME CARE INSTRUCTIONS  Try to achieve and maintain an ideal body weight.   Avoid drinking alcoholic beverages.   Stop smoking.   Put the head of your bed on 4 to 6 inch blocks. This will keep your head and esophagus higher than your stomach. If you cannot use blocks, sleep with several pillows under your head and shoulders.   MINIMIZE THE USE OF aspirin, ibuprofen (Advil or Motrin), or other nonsteroidal  anti-inflammatory drugs.   Do not wear tight clothing around your chest or stomach.   Eat smaller meals and eat more frequently. This keeps your stomach from getting too full. Eat slowly.   Do not lie down for 2 or 3 hours after eating. Do not eat or drink anything 1 to 2  hours before going to bed.   Avoid caffeine beverages (colas, coffee, cocoa, tea), fatty foods, citrus fruits and all other foods and drinks that contain acid and that seem to increase the problems.   Avoid bending over, especially after eating. Also avoid straining during bowel movements or when urinating or lifting things. Anything that increases the pressure in your belly increases the amount of acid that may be pushed up into your esophagus.   Gastritis/DUODENITIS  Gastritis is an inflammation (the body's way of reacting to injury and/or infection) of the stomach. DUODENITIS is an inflammation (the body's way of reacting to injury and/or infection) of the FIRST PART OF THE SMALL INTESTINES. It is often caused by bacterial (germ) infections. It can also be caused BY ASPIRIN, BC/GOODY POWDER'S, (IBUPROFEN) MOTRIN, OR ALEVE (NAPROXEN), chemicals (including alcohol), SPICY FOODS, and medications. This illness may be associated with generalized malaise (feeling tired, not well), UPPER ABDOMINAL STOMACH cramps, and fever. One common bacterial cause of gastritis is an organism known as H. Pylori. This can be treated with antibiotics.

## 2017-04-25 NOTE — H&P (Signed)
Primary Care Physician:  Lemmie Evens, MD Primary Gastroenterologist:  Dr. Oneida Alar  Pre-Procedure History & Physical: HPI:  Jasmine Rhodes is a 71 y.o. female here for DYSPHAGIA/DYSPEPSIA/PERSONAL HISTORY OF POLYPS.  Past Medical History:  Diagnosis Date  . Asthma   . DDD (degenerative disc disease), lumbar   . Hyperlipidemia   . Hypertension   . Osteoarthritis     Past Surgical History:  Procedure Laterality Date  . COLONOSCOPY    . DG THUMB LEFT HAND    . tonsils removal      Prior to Admission medications   Medication Sig Start Date End Date Taking? Authorizing Provider  Ascorbic Acid (VITAMIN C PO) Take 1 tablet by mouth daily.   Yes [provider]  aspirin EC 81 MG tablet Take 81 mg by mouth daily.   Yes [provider]  atenolol (TENORMIN) 50 MG tablet Take 50 mg by mouth daily.   Yes [provider]  cetirizine (ZYRTEC) 10 MG tablet Take 10 mg by mouth daily as needed.    Yes [provider]  Cyanocobalamin (VITAMIN B-12 PO) Take 1 tablet by mouth daily.   Yes [provider]  HYDROcodone-acetaminophen (NORCO/VICODIN) 5-325 MG tablet Take 1-2 tablets by mouth every 4 (four) hours as needed. 04/11/17  Yes [provider]  Polyethyl Glycol-Propyl Glycol (SYSTANE ULTRA OP) Apply 1 drop to eye 2 (two) times daily as needed.   Yes [provider]  pravastatin (PRAVACHOL) 20 MG tablet Take 20 mg by mouth daily.   Yes [provider]  Pyridoxine HCl (VITAMIN B-6 PO) Take 1 tablet by mouth daily.   Yes [provider]  triamterene-hydrochlorothiazide (DYAZIDE) 37.5-25 MG capsule Take 1 capsule by mouth daily.   Yes [provider]  albuterol (PROVENTIL) (2.5 MG/3ML) 0.083% nebulizer solution Take 2.5 mg by nebulization every 6 (six) hours as needed for wheezing or shortness of breath.    [provider]  Cholecalciferol (VITAMIN D PO) Take 1 tablet by mouth daily.    [provider]  fluticasone (FLONASE) 50 MCG/ACT nasal spray Place 1 spray into both nostrils as needed for allergies.     [provider]  Na Sulfate-K Sulfate-Mg Sulf (SUPREP BOWEL PREP KIT) 17.5-3.13-1.6 GM/180ML SOLN 1 kit as directed 03/11/17   Mahala Menghini, PA-C  naproxen sodium (ANAPROX) 220 MG tablet Take 220 mg by mouth as needed.    [provider]    Allergies as of 03/11/2017 - Review Complete 03/11/2017  Allergen Reaction Noted  . Sulfa antibiotics Rash 01/11/2017    Family History  Problem Relation Age of Onset  . Hypertension Mother   . Colon cancer Neg Hx     Social History   Social History  . Marital status: Divorced    Spouse name: N/A  . Number of children: N/A  . Years of education: N/A   Occupational History  . Not on file.   Social History Main Topics  . Smoking status: Current Every Day Smoker  . Smokeless tobacco: Never Used  . Alcohol use Yes     Comment: occasional  . Drug use: No  . Sexual activity: Not on file   Other Topics Concern  . Not on file   Social History Narrative  . No narrative on file    Review of Systems: See HPI, otherwise negative ROS   Physical Exam: BP 109/72   Pulse 73   Temp 98 F (36.7 C) (Oral)   Resp  14   Ht 5' 5"  (1.651 m)   Wt 145 lb (65.8 kg)   SpO2 100%   BMI 24.13 kg/m  General:   Alert,  pleasant and cooperative in NAD Head:  Normocephalic and atraumatic. Neck:  Supple; Lungs:  Clear throughout to auscultation.    Heart:  Regular rate and rhythm. Abdomen:  Soft, nontender and nondistended. Normal bowel sounds, without guarding, and without rebound.   Neurologic:  Alert and  oriented x4;  grossly normal neurologically.  Impression/Plan:     DYSPHAGIA/DYSPEPSIA/PERSONAL HISTORY OF POLYPS.   PLAN:  EGD/DIL/tcs TODAY DISCUSSED PROCEDURE, BENEFITS, & RISKS: < 1% chance of medication reaction, bleeding, perforation, or rupture of spleen/liver.

## 2017-04-25 NOTE — Op Note (Signed)
[facility] Patient Name: Jasmine Rhodes Procedure Date: 04/25/2017 10:52 AM MRN: [id number] Date of Birth: 1946/05/20 Attending MD: Jonette Eva MD, MD CSN: [id number] Age: 71 Admit Type: Outpatient Procedure:                Colonoscopy, SCREENING Indications:              Screening for colorectal malignant neoplasm Providers:                Jonette Eva MD, MD, Nena Polio, RN, Dyann Ruddle Referring MD:             Gareth Morgan Medicines:                Meperidine 50 mg IV, Midazolam 5 mg IV Complications:            No immediate complications. Estimated Blood Loss:     Estimated blood loss: none. Procedure:                Pre-Anesthesia Assessment:                           - Prior to the procedure, a History and Physical                            was performed, and patient medications and                            allergies were reviewed. The patient's tolerance of                            previous anesthesia was also reviewed. The risks                            and benefits of the procedure and the sedation                            options and risks were discussed with the patient.                            All questions were answered, and informed consent                            was obtained. Prior Anticoagulants: The patient has                            taken aspirin, last dose was 1 day prior to                            procedure. ASA Grade Assessment: II - A patient                            with mild systemic disease. After reviewing the                            risks and benefits, the patient was deemed in  satisfactory condition to undergo the procedure.                            After obtaining informed consent, the colonoscope                            was passed under direct vision. Throughout the                            procedure, the patient's blood pressure, pulse, and                            oxygen  saturations were monitored continuously. The                            [id number] ([id number]) scope was introduced through                            the anus and advanced to the 5 cm into the ileum.                            The colonoscopy was technically difficult and                            complex due to significant looping. Successful                            completion of the procedure was aided by increasing                            the dose of sedation medication, straightening and                            shortening the scope to obtain bowel loop reduction                            and COLOWRAP. The patient tolerated the procedure                            fairly well. The quality of the bowel preparation                            was excellent. The terminal ileum, ileocecal valve,                            appendiceal orifice, and rectum were photographed. Scope In: 12:26:27 PM Scope Out: 12:41:18 PM Scope Withdrawal Time: 0 hours 11 minutes 1 second  Total Procedure Duration: 0 hours 14 minutes 51 seconds  Findings:      The terminal ileum appeared normal.      The recto-sigmoid colon, sigmoid colon and descending colon were       significantly redundant.      External and internal hemorrhoids were found during retroflexion.      A few large-mouthed  diverticula were found in the sigmoid colon and       descending colon. Impression:               - The examined portion of the ileum was normal.                           - Redundant LEFT colon.                           - The examination was otherwise normal.                           - External and internal hemorrhoids. Moderate Sedation:      Moderate (conscious) sedation was administered by the endoscopy nurse       and supervised by the endoscopist. The following parameters were       monitored: oxygen saturation, heart rate, blood pressure, and response       to care. Total physician intraservice time was 44  minutes. Recommendation:           - High fiber diet.                           - Continue present medications.                           - Patient has a contact number available for                            emergencies. The signs and symptoms of potential                            delayed complications were discussed with the                            patient. Return to normal activities tomorrow.                            Written discharge instructions were provided to the                            patient.                           - No repeat colonoscopy due to age. Procedure Code(s):        --- Professional ---                           [phone], Colonoscopy, flexible; diagnostic, including                            collection of specimen(s) by brushing or washing,                            when performed (separate procedure)  41324, Moderate sedation services provided by the                            same physician or other qualified health care                            professional performing the diagnostic or                            therapeutic service that the sedation supports,                            requiring the presence of an independent trained                            observer to assist in the monitoring of the                            patient's level of consciousness and physiological                            status; initial 15 minutes of intraservice time,                            patient age 23 years or older                           [phone], Moderate sedation services; each additional                            15 minutes intraservice time                           99153, Moderate sedation services; each additional                            15 minutes intraservice time Diagnosis Code(s):        --- Professional ---                           Z12.11, Encounter for screening for malignant                            neoplasm  of colon                           K64.8, Other hemorrhoids                           Q43.8, Other specified congenital malformations of                            intestine CPT copyright 2016 [employer]. All rights reserved. The codes documented in this report are preliminary and upon coder review may  be revised to meet current compliance requirements. Jonette Eva, MD Jonette Eva  MD, MD 04/25/2017 1:06:53 PM This report has been signed electronically. Number of Addenda: 0

## 2017-04-25 NOTE — Op Note (Signed)
[facility] Patient Name: Jasmine Rhodes Procedure Date: 04/25/2017 12:43 PM MRN: [id number] Date of Birth: 10-01-1945 Attending MD: Jonette Eva MD, MD CSN: [id number] Age: 71 Admit Type: Outpatient Procedure:                Upper GI endoscopy WITH COLD FORCEPS                            BIOPSY/ESOPHAGEAL DILATION Indications:              Epigastric abdominal pain, Dysphagia Providers:                Jonette Eva MD, MD, Nena Polio, RN, Dyann Ruddle Referring MD:             Gareth Morgan Medicines:                Meperidine 50 mg IV, Midazolam 2 mg IV Complications:            No immediate complications. Estimated Blood Loss:     Estimated blood loss was minimal. Procedure:                Pre-Anesthesia Assessment:                           - Prior to the procedure, a History and Physical                            was performed, and patient medications and                            allergies were reviewed. The patient's tolerance of                            previous anesthesia was also reviewed. The risks                            and benefits of the procedure and the sedation                            options and risks were discussed with the patient.                            All questions were answered, and informed consent                            was obtained. Prior Anticoagulants: The patient has                            taken aspirin, last dose was 1 day prior to                            procedure. ASA Grade Assessment: II - A patient                            with mild systemic disease. After reviewing the  risks and benefits, the patient was deemed in                            satisfactory condition to undergo the procedure.                           After obtaining informed consent, the endoscope was                            passed under direct vision. Throughout the                            procedure, the patient's  blood pressure, pulse, and                            oxygen saturations were monitored continuously. The                            [id number]) scope was introduced through the                            mouth, and advanced to the second part of duodenum.                            The upper GI endoscopy was accomplished without                            difficulty. The patient tolerated the procedure                            fairly well. Scope In: 12:47:51 PM Scope Out: 12:58:00 PM Total Procedure Duration: 0 hours 10 minutes 9 seconds  Findings:      A moderate Schatzki ring (acquired) was found at the gastroesophageal       junction. A guidewire was placed and the scope was withdrawn. Dilation       was performed with a Savary dilator with mild resistance at 15 mm, 16 mm       and 17 mm.      Patchy mild inflammation characterized by congestion (edema), erosions       and erythema was found in the gastric fundus and in the gastric antrum.       Biopsies were taken with a cold forceps for histology.      Patchy mild inflammation characterized by congestion (edema) and       erythema was found in the duodenal bulb.      The second portion of the duodenum was normal. Impression:               - Moderate Schatzki ring. Dilated.                           - EPIGASTRIC PAIN DUE TO NSAID Gastritis/Duodenitis. Moderate Sedation:      Moderate (conscious) sedation was administered by the endoscopy nurse       and supervised by the endoscopist. The following parameters were       monitored: oxygen saturation,  heart rate, blood pressure, and response       to care. Total physician intraservice time was 44 minutes. Recommendation:           - Await pathology results.                           - High fiber diet and low fat diet.                           - Continue present medications.                           - Use Prilosec (omeprazole) 20 mg PO daily.                           -  Return to my office in 3 months.                           - Patient has a contact number available for                            emergencies. The signs and symptoms of potential                            delayed complications were discussed with the                            patient. Return to normal activities tomorrow.                            Written discharge instructions were provided to the                            patient. Procedure Code(s):        --- Professional ---                           [phone], Esophagogastroduodenoscopy, flexible,                            transoral; with insertion of guide wire followed by                            passage of dilator(s) through esophagus over guide                            wire                           43239, Esophagogastroduodenoscopy, flexible,                            transoral; with biopsy, single or multiple                           99152, Moderate sedation services provided by the  same physician or other qualified health care                            professional performing the diagnostic or                            therapeutic service that the sedation supports,                            requiring the presence of an independent trained                            observer to assist in the monitoring of the                            patient's level of consciousness and physiological                            status; initial 15 minutes of intraservice time,                            patient age 70 years or older                           [phone], Moderate sedation services; each additional                            15 minutes intraservice time                           99153, Moderate sedation services; each additional                            15 minutes intraservice time Diagnosis Code(s):        --- Professional ---                           K22.2, Esophageal obstruction                            K29.70, Gastritis, unspecified, without bleeding                           K29.80, Duodenitis without bleeding                           R10.13, Epigastric pain                           R13.10, Dysphagia, unspecified CPT copyright 2016 American Medical Association. All rights reserved. The codes documented in this report are preliminary and upon coder review may  be revised to meet current compliance requirements. Jonette Eva, MD Jonette Eva MD, MD 04/25/2017 1:11:07 PM This report has been signed electronically. Number of Addenda: 0

## 2017-04-26 DIAGNOSIS — G5601 Carpal tunnel syndrome, right upper limb: Secondary | ICD-10-CM | POA: Diagnosis not present

## 2017-04-29 ENCOUNTER — Encounter (HOSPITAL_COMMUNITY): Payer: Self-pay | Admitting: Gastroenterology

## 2017-05-03 DIAGNOSIS — G5601 Carpal tunnel syndrome, right upper limb: Secondary | ICD-10-CM | POA: Diagnosis not present

## 2017-05-08 ENCOUNTER — Telehealth: Payer: Self-pay | Admitting: Gastroenterology

## 2017-05-08 NOTE — Telephone Encounter (Signed)
Please call pt. HER stomach Bx shows gastritis DUE TO ASA AND NAPROXEN.    TO TREAT GASTRITIS & DUODENITIS, CONTINUE OMEPRAZOLE.  TAKE 30 MINUTES PRIOR TO YOUR FIST MEAL.  FOLLOW A HIGH FIBER/LOW FAT DIET. AVOID ITEMS THAT CAUSE BLOATING.   Follow up Dec 5th @ 10:30 am

## 2017-05-09 DIAGNOSIS — G5601 Carpal tunnel syndrome, right upper limb: Secondary | ICD-10-CM | POA: Diagnosis not present

## 2017-05-13 NOTE — Telephone Encounter (Signed)
Called, many rings and no answer. Mailing a letter to call for results.

## 2017-05-17 DIAGNOSIS — G5601 Carpal tunnel syndrome, right upper limb: Secondary | ICD-10-CM | POA: Diagnosis not present

## 2017-05-20 ENCOUNTER — Telehealth: Payer: Self-pay | Admitting: Gastroenterology

## 2017-05-20 NOTE — Telephone Encounter (Signed)
Spoke with pt, pt was notified of her results. Pt was concerned about the hernia seen on the report. Pt notified that hernia is currently small and no surgical procedures are needed at this time. Pt will call back if she experiences any symptoms concerning the hernia.

## 2017-05-20 NOTE — Telephone Encounter (Signed)
Pt called to say that she received a letter from DS that she was unable to reach her by phone and to call about her results and plan of care. Patient would like someone to call her back ASAP. She is aware of her PP FU in Dec. Please call [phone]

## 2017-05-24 DIAGNOSIS — G5601 Carpal tunnel syndrome, right upper limb: Secondary | ICD-10-CM | POA: Diagnosis not present

## 2017-05-27 DIAGNOSIS — G5601 Carpal tunnel syndrome, right upper limb: Secondary | ICD-10-CM | POA: Diagnosis not present

## 2017-06-03 DIAGNOSIS — G5601 Carpal tunnel syndrome, right upper limb: Secondary | ICD-10-CM | POA: Diagnosis not present

## 2017-06-05 DIAGNOSIS — G5601 Carpal tunnel syndrome, right upper limb: Secondary | ICD-10-CM | POA: Diagnosis not present

## 2017-06-07 DIAGNOSIS — G5601 Carpal tunnel syndrome, right upper limb: Secondary | ICD-10-CM | POA: Diagnosis not present

## 2017-06-19 DIAGNOSIS — G5601 Carpal tunnel syndrome, right upper limb: Secondary | ICD-10-CM | POA: Diagnosis not present

## 2017-07-01 DIAGNOSIS — Z23 Encounter for immunization: Secondary | ICD-10-CM | POA: Diagnosis not present

## 2017-07-01 DIAGNOSIS — I10 Essential (primary) hypertension: Secondary | ICD-10-CM | POA: Diagnosis not present

## 2017-07-01 DIAGNOSIS — E785 Hyperlipidemia, unspecified: Secondary | ICD-10-CM | POA: Diagnosis not present

## 2017-07-01 DIAGNOSIS — J302 Other seasonal allergic rhinitis: Secondary | ICD-10-CM | POA: Diagnosis not present

## 2017-07-01 DIAGNOSIS — R630 Anorexia: Secondary | ICD-10-CM | POA: Diagnosis not present

## 2017-07-03 ENCOUNTER — Ambulatory Visit: Payer: PPO | Admitting: Nurse Practitioner

## 2017-07-03 ENCOUNTER — Encounter: Payer: Self-pay | Admitting: Nurse Practitioner

## 2017-07-03 VITALS — BP 111/69 | HR 65 | Temp 97.5°F | Ht 65.0 in | Wt 144.0 lb

## 2017-07-03 DIAGNOSIS — R1310 Dysphagia, unspecified: Secondary | ICD-10-CM | POA: Diagnosis not present

## 2017-07-03 DIAGNOSIS — K2990 Gastroduodenitis, unspecified, without bleeding: Secondary | ICD-10-CM | POA: Diagnosis not present

## 2017-07-03 DIAGNOSIS — R1319 Other dysphagia: Secondary | ICD-10-CM

## 2017-07-03 DIAGNOSIS — R1013 Epigastric pain: Secondary | ICD-10-CM | POA: Diagnosis not present

## 2017-07-03 DIAGNOSIS — K2970 Gastritis, unspecified, without bleeding: Secondary | ICD-10-CM | POA: Diagnosis not present

## 2017-07-03 DIAGNOSIS — G8929 Other chronic pain: Secondary | ICD-10-CM

## 2017-07-03 DIAGNOSIS — R1031 Right lower quadrant pain: Secondary | ICD-10-CM | POA: Diagnosis not present

## 2017-07-03 NOTE — Patient Instructions (Signed)
1. I will look into a possible discrepancy between her discharge papers and procedure report. 2. If you do not hear from me within a week call us. 3. Continue your current medications, especially your Prilosec acid blocker. 4. Return for follow-up in 6 months. 5. Call us if you have any questions or concerns.

## 2017-07-03 NOTE — Assessment & Plan Note (Signed)
Dysphasia symptoms have resolved after dilation.  She is concerned that her discharge instructions indicated she had a hiatal hernia but her endoscopy report does not mention this.  I will find a link for her discharge instructions and rectify discrepancy and recommend corrections if needed.  Return for follow-up in 6 months.  Continue current medications.

## 2017-07-03 NOTE — Assessment & Plan Note (Signed)
Right lower quadrant pain resolved at this time.  Continue to monitor.  Return for follow-up in 6 months.

## 2017-07-03 NOTE — Progress Notes (Signed)
cc'd to pcp 

## 2017-07-03 NOTE — Assessment & Plan Note (Signed)
Gastritis symptoms have resolved at this time.  Continue to monitor, continue PPI and other prescription medications.  Return for follow-up in 6 months.

## 2017-07-03 NOTE — Progress Notes (Signed)
Referring Provider: Gareth MorganKnowlton, Steve, MD Primary Care Physician:  Gareth MorganKnowlton, Steve, MD Primary GI:  Dr. Darrick PennaFields  Chief Complaint  Patient presents with  . Dysphagia    pp f/u, doing ok    HPI:   Jasmine Rhodes is a 71 y.o. female who presents for post procedure follow-up.  The patient was last seen in our office 03/11/2017 for history of colon polyps, chronic right lower quadrant pain, abdominal pain epigastric, esophageal dysphasia.  At her last visit she admitted right lower quadrant pain and some upper GI symptoms.  Last colonoscopy was previously either 2001 or 2005.  They did find polyps at that time, per the patient's recollection.  Her pain was over the last several months, unrelated to meals or bowel movements and last for minutes to hours at a time.  Regular bowel movements, sometimes a lot of gas.  No rectal bleeding.  Epigastric discomfort with meals and some solid food dysphagia but without vomiting.  No traditional GERD symptoms.  Aleve on occasion, daily aspirin but no other NSAIDs.  Recommended colonoscopy and upper endoscopy.  Colonoscopy and EGD were completed 04/25/2017.  Colonoscopy found normal ileum, redundant left colon, otherwise normal.  External and internal hemorrhoids.  Recommended high-fiber diet, continue current medications.  EGD completed the same day found moderate Schatzki's ring status post dilation, epigastric pain due to NSAID gastritis/duodenitis.  Recommended Prilosec daily, avoid NSAIDs, return for follow-up in 3 months.  Today she states she's doing ok. Dysphagia is improved after dilation. GERD symptoms is better on Prilosec with no breakthrough symptoms. She has had decreasing appetite and would like help with her diet. Nutrition consult offered and accepted. RLQ abdominal pain is resolved currently. Denies abdominal pain, N/V, hematochezia, melena, fever, chills. Has had some unintentional weight loss (subjectively 30 lbs in the past 3 years) which she  attributes to stress and caregiving for her mom. Denies chest pain, dyspnea, dizziness, lightheadedness, syncope, near syncope. Denies any other upper or lower GI symptoms.  Past Medical History:  Diagnosis Date  . Asthma   . DDD (degenerative disc disease), lumbar   . Hyperlipidemia   . Hypertension   . Osteoarthritis     Past Surgical History:  Procedure Laterality Date  . COLONOSCOPY    . COLONOSCOPY N/A 04/25/2017   Procedure: COLONOSCOPY;  Surgeon: West BaliFields, Sandi L, MD;  Location: [facility];  Service: Endoscopy;  Laterality: N/A;  1200  . DG THUMB LEFT HAND    . ESOPHAGOGASTRODUODENOSCOPY N/A 04/25/2017   Procedure: ESOPHAGOGASTRODUODENOSCOPY (EGD);  Surgeon: West BaliFields, Sandi L, MD;  Location: [facility];  Service: Endoscopy;  Laterality: N/A;  . SAVORY DILATION N/A 04/25/2017   Procedure: SAVORY DILATION;  Surgeon: West BaliFields, Sandi L, MD;  Location: [facility];  Service: Endoscopy;  Laterality: N/A;  . tonsils removal      Current Outpatient Medications  Medication Sig Dispense Refill  . albuterol (PROVENTIL) (2.5 MG/3ML) 0.083% nebulizer solution Take 2.5 mg by nebulization every 6 (six) hours as needed for wheezing or shortness of breath.    . Ascorbic Acid (VITAMIN C PO) Take 1 tablet by mouth daily.    Marland Kitchen. aspirin EC 81 MG tablet Take 81 mg by mouth daily.    Marland Kitchen. atenolol (TENORMIN) 50 MG tablet Take 50 mg by mouth daily.    . cetirizine (ZYRTEC) 10 MG tablet Take 10 mg by mouth daily as needed.     . Cholecalciferol (VITAMIN D PO) Take 1 tablet by mouth daily.    .Marland Kitchen  Cyanocobalamin (VITAMIN B-12 PO) Take 1 tablet by mouth daily.    . fluticasone (FLONASE) 50 MCG/ACT nasal spray Place 1 spray into both nostrils as needed for allergies.     Marland Kitchen. HYDROcodone-acetaminophen (NORCO/VICODIN) 5-325 MG tablet Take 1-2 tablets by mouth every 4 (four) hours as needed.  0  . naproxen sodium (ANAPROX) 220 MG tablet Take 220 mg by mouth as needed.    Marland Kitchen. omeprazole (PRILOSEC) 20 MG capsule 1 PO  30 MINS PRIOR TO BREAKFAST. 90 capsule 3  . Polyethyl Glycol-Propyl Glycol (SYSTANE ULTRA OP) Apply 1 drop to eye 2 (two) times daily as needed.    . pravastatin (PRAVACHOL) 20 MG tablet Take 20 mg by mouth daily.    . Pyridoxine HCl (VITAMIN B-6 PO) Take 1 tablet by mouth daily.    Marland Kitchen. triamterene-hydrochlorothiazide (DYAZIDE) 37.5-25 MG capsule Take 1 capsule by mouth daily.     No current facility-administered medications for this visit.     Allergies as of 07/03/2017 - Review Complete 07/03/2017  Allergen Reaction Noted  . Sulfa antibiotics Rash 01/11/2017    Family History  Problem Relation Age of Onset  . Hypertension Mother   . Colon cancer Neg Hx     Social History   Socioeconomic History  . Marital status: Divorced    Spouse name: None  . Number of children: None  . Years of education: None  . Highest education level: None  Social Needs  . Financial resource strain: None  . Food insecurity - worry: None  . Food insecurity - inability: None  . Transportation needs - medical: None  . Transportation needs - non-medical: None  Occupational History  . None  Tobacco Use  . Smoking status: Current Every Day Smoker    Types: Cigarettes  . Smokeless tobacco: Never Used  Substance and Sexual Activity  . Alcohol use: Yes    Comment: occasional  . Drug use: No  . Sexual activity: None  Other Topics Concern  . None  Social History Narrative  . None    Review of Systems: Complete ROS negative except as per HPI.   Physical Exam: BP 111/69   Pulse 65   Temp (!) 97.5 F (36.4 C) (Oral)   Ht 5\' 5"  (1.651 m)   Wt 144 lb (65.3 kg)   BMI 23.96 kg/m  General:   Alert and oriented. Pleasant and cooperative. Well-nourished and well-developed.  Eyes:  Without icterus, sclera clear and conjunctiva pink.  Ears:  Normal auditory acuity. Cardiovascular:  S1, S2 present without murmurs appreciated. Extremities without clubbing or edema. Respiratory:  Clear to auscultation  bilaterally. No wheezes, rales, or rhonchi. No distress.  Gastrointestinal:  +BS, soft, non-tender and non-distended. No HSM noted. No guarding or rebound. No masses appreciated.  Rectal:  Deferred  Musculoskalatal:  Symmetrical without gross deformities. Neurologic:  Alert and oriented x4;  grossly normal neurologically. Psych:  Alert and cooperative. Normal mood and affect. Heme/Lymph/Immune: No excessive bruising noted.    07/03/2017 10:56 AM   Disclaimer: This note was dictated with voice recognition software. Similar sounding words can inadvertently be transcribed and may not be corrected upon review.

## 2017-07-03 NOTE — Assessment & Plan Note (Signed)
Epigastric pain has resolved at this time along with resolution of her gastritis symptoms.  Continue to monitor, continue PPI, follow-up in 6 months.

## 2017-08-26 ENCOUNTER — Other Ambulatory Visit: Payer: Self-pay | Admitting: Family Medicine

## 2017-08-26 DIAGNOSIS — R921 Mammographic calcification found on diagnostic imaging of breast: Secondary | ICD-10-CM

## 2017-09-03 DIAGNOSIS — H524 Presbyopia: Secondary | ICD-10-CM | POA: Diagnosis not present

## 2017-09-03 DIAGNOSIS — Z961 Presence of intraocular lens: Secondary | ICD-10-CM | POA: Diagnosis not present

## 2017-09-03 DIAGNOSIS — H52221 Regular astigmatism, right eye: Secondary | ICD-10-CM | POA: Diagnosis not present

## 2017-09-06 DIAGNOSIS — G4733 Obstructive sleep apnea (adult) (pediatric): Secondary | ICD-10-CM | POA: Diagnosis not present

## 2017-09-06 DIAGNOSIS — I10 Essential (primary) hypertension: Secondary | ICD-10-CM | POA: Diagnosis not present

## 2017-09-06 DIAGNOSIS — J209 Acute bronchitis, unspecified: Secondary | ICD-10-CM | POA: Diagnosis not present

## 2017-09-06 DIAGNOSIS — E663 Overweight: Secondary | ICD-10-CM | POA: Diagnosis not present

## 2017-09-06 DIAGNOSIS — E785 Hyperlipidemia, unspecified: Secondary | ICD-10-CM | POA: Diagnosis not present

## 2017-09-11 DIAGNOSIS — J209 Acute bronchitis, unspecified: Secondary | ICD-10-CM | POA: Diagnosis not present

## 2017-09-24 ENCOUNTER — Emergency Department (HOSPITAL_COMMUNITY)
Admission: EM | Admit: 2017-09-24 | Discharge: 2017-09-24 | Disposition: A | Discharge status: Home / Self Care | Payer: PPO | Attending: Emergency Medicine | Admitting: Emergency Medicine

## 2017-09-24 ENCOUNTER — Encounter (HOSPITAL_COMMUNITY): Payer: Self-pay | Admitting: Emergency Medicine

## 2017-09-24 ENCOUNTER — Other Ambulatory Visit: Payer: Self-pay

## 2017-09-24 ENCOUNTER — Emergency Department (HOSPITAL_COMMUNITY): Payer: PPO

## 2017-09-24 DIAGNOSIS — F17210 Nicotine dependence, cigarettes, uncomplicated: Secondary | ICD-10-CM | POA: Insufficient documentation

## 2017-09-24 DIAGNOSIS — J45909 Unspecified asthma, uncomplicated: Secondary | ICD-10-CM | POA: Insufficient documentation

## 2017-09-24 DIAGNOSIS — I10 Essential (primary) hypertension: Secondary | ICD-10-CM | POA: Diagnosis not present

## 2017-09-24 DIAGNOSIS — M542 Cervicalgia: Secondary | ICD-10-CM | POA: Diagnosis not present

## 2017-09-24 DIAGNOSIS — R291 Meningismus: Secondary | ICD-10-CM | POA: Diagnosis not present

## 2017-09-24 DIAGNOSIS — M7918 Myalgia, other site: Secondary | ICD-10-CM

## 2017-09-24 DIAGNOSIS — M25519 Pain in unspecified shoulder: Secondary | ICD-10-CM | POA: Diagnosis not present

## 2017-09-24 DIAGNOSIS — R05 Cough: Secondary | ICD-10-CM | POA: Diagnosis not present

## 2017-09-24 LAB — BASIC METABOLIC PANEL
ANION GAP: 10 (ref 5–15)
BUN: 11 mg/dL (ref 6–20)
CO2: 29 mmol/L (ref 22–32)
Calcium: 9.8 mg/dL (ref 8.9–10.3)
Chloride: 100 mmol/L — ABNORMAL LOW (ref 101–111)
Creatinine, Ser: 0.64 mg/dL (ref 0.44–1.00)
GFR calc Af Amer: >60 mL/min (ref >60)
GLUCOSE: 102 mg/dL — AB (ref 65–99)
POTASSIUM: 3.5 mmol/L (ref 3.5–5.1)
Sodium: 139 mmol/L (ref 135–145)

## 2017-09-24 LAB — CBC
HEMATOCRIT: 40 % (ref 36.0–46.0)
Hemoglobin: 13.4 g/dL (ref 12.0–15.0)
MCH: 31.1 pg (ref 26.0–34.0)
MCHC: 33.5 g/dL (ref 30.0–36.0)
MCV: 92.8 fL (ref 78.0–100.0)
PLATELETS: 218 10*3/uL (ref 150–400)
RBC: 4.31 MIL/uL (ref 3.87–5.11)
RDW: 11.9 % (ref 11.5–15.5)
WBC: 6.6 10*3/uL (ref 4.0–10.5)

## 2017-09-24 LAB — CK: Total CK: 40 U/L (ref 38–234)

## 2017-09-24 MED ORDER — METHOCARBAMOL 500 MG PO TABS
500.0000 mg | ORAL_TABLET | Freq: Once | ORAL | Status: AC
  Administered 2017-09-24: 500 mg via ORAL
  Filled 2017-09-24: qty 1

## 2017-09-24 MED ORDER — METHOCARBAMOL 500 MG PO TABS: 500.0000 mg | ORAL_TABLET | Freq: Three times a day (TID) | ORAL | 0 refills | Status: DC | PRN

## 2017-09-24 NOTE — Discharge Instructions (Signed)

## 2017-09-24 NOTE — ED Triage Notes (Signed)
Pt was sent by PCP for possible meningitis. Pt c/o of neck pain and chills since yesterday. Denies fever.

## 2017-09-24 NOTE — ED Notes (Signed)
Pt continues to c/o neck pain that goes into arms.  Pt no visible distress.

## 2017-09-24 NOTE — ED Provider Notes (Signed)
Emergency Department Provider Note   I have reviewed the triage vital signs and the nursing notes.   HISTORY  Chief Complaint Neck Pain   HPI Jasmine Rhodes is a 72 y.o. female with PMH of asthma, HLD, OA, and HTN presents to the emergency department for evaluation of neck/shoulder tightness/soreness.  Patient developed upper respiratory tract infection symptoms 2 weeks ago.  She is been on 2 rounds of antibiotics and felt lives of her respiratory symptoms are improving she states that 2 weeks ago she had a fever but none since. 2 days ago she developed stiffness in the neck, arms, and lower back.  She denies any recent fevers.  Yesterday she felt a slight chill with this has not recurred.  She does not have headache..  She states the soreness is slightly worse in the right arm. Denies any weakness/numbness in the extremities.    Past Medical History:  Diagnosis Date  . Asthma   . DDD (degenerative disc disease), lumbar   . Hyperlipidemia   . Hypertension   . Osteoarthritis     Patient Active Problem List   Diagnosis Date Noted  . Gastritis and gastroduodenitis   . History of colonic polyps 03/11/2017  . Chronic RLQ pain 03/11/2017  . Abdominal pain, epigastric 03/11/2017  . Esophageal dysphagia 03/11/2017  . HTN (hypertension) 01/11/2017  . Overweight 01/11/2017  . Hyperlipidemia 01/11/2017  . Allergic rhinitis 01/11/2017  . Mild intermittent asthma 01/11/2017  . Osteoarthritis of both knees 01/11/2017  . Carpal tunnel syndrome, right 01/11/2017  . Chest pain 01/11/2017  . Stress 01/11/2017  . Lumbago with sciatica, right side 01/11/2017    Past Surgical History:  Procedure Laterality Date  . COLONOSCOPY    . COLONOSCOPY N/A 04/25/2017   Procedure: COLONOSCOPY;  Surgeon: West Bali, MD;  Location: [facility];  Service: Endoscopy;  Laterality: N/A;  1200  . DG THUMB LEFT HAND    . ESOPHAGOGASTRODUODENOSCOPY N/A 04/25/2017   Procedure:  ESOPHAGOGASTRODUODENOSCOPY (EGD);  Surgeon: West Bali, MD;  Location: [facility];  Service: Endoscopy;  Laterality: N/A;  . SAVORY DILATION N/A 04/25/2017   Procedure: SAVORY DILATION;  Surgeon: West Bali, MD;  Location: [facility];  Service: Endoscopy;  Laterality: N/A;  . tonsils removal      Current Outpatient Rx  . Order #: [id number] Class: Historical Med  . Order #: [id number] Class: Historical Med  . Order #: [id number] Class: Historical Med  . Order #: [id number] Class: Historical Med  . Order #: [id number] Class: Historical Med  . Order #: [id number] Class: Historical Med  . Order #: [id number] Class: Historical Med  . Order #: [id number] Class: Historical Med  . Order #: [id number] Class: Historical Med  . Order #: [id number] Class: Print  . Order #: [id number] Class: Historical Med  . Order #: [id number] Class: Normal  . Order #: [id number] Class: Historical Med  . Order #: [id number] Class: Historical Med  . Order #: [id number] Class: Historical Med  . Order #: [id number] Class: Historical Med    Allergies Sulfa antibiotics  Family History  Problem Relation Age of Onset  . Hypertension Mother   . Colon cancer Neg Hx     Social History Social History   Tobacco Use  . Smoking status: Current Every Day Smoker    Types: Cigarettes  . Smokeless tobacco: Never Used  Substance Use Topics  . Alcohol use: Yes    Comment: occasional  . Drug use: No    Review of Systems  Constitutional: No fever. Chills x 2 (brief)  yesterday.  Eyes: No visual changes. ENT: No sore throat. Cardiovascular: Denies chest pain. Respiratory: Denies shortness of breath. Recent URI (resolved).  Gastrointestinal: No abdominal pain. No nausea, no vomiting.  No diarrhea.  No constipation. Genitourinary: Negative for dysuria. Musculoskeletal: Negative for back pain. Positive neck and shoulder pain.  Skin: Negative for rash.  Neurological: Negative for headaches, focal weakness or numbness.    10-point ROS otherwise negative.  ____________________________________________   PHYSICAL EXAM:  VITAL SIGNS: ED Triage Vitals  Enc Vitals Group     BP 09/24/17 1413 129/69     Pulse Rate 09/24/17 1413 63     Resp 09/24/17 1413 19     Temp 09/24/17 1413 98.8 F (37.1 C)     Temp Source 09/24/17 1413 Oral     SpO2 09/24/17 1413 100 %     Weight 09/24/17 1415 144 lb (65.3 kg)     Height 09/24/17 1415 5\' 5"  (1.651 m)     Pain Score 09/24/17 1413 6   Constitutional: Alert and oriented. Well appearing and in no acute distress. Eyes: Conjunctivae are normal.  Head: Atraumatic. Nose: No congestion/rhinnorhea. Mouth/Throat: Mucous membranes are moist. Neck: No stridor. Some pain with ROM of the neck, worse on the right lateral neck and bilateral shoulders.  Cardiovascular: Normal rate, regular rhythm. Good peripheral circulation. Grossly normal heart sounds.   Respiratory: Normal respiratory effort.  No retractions. Lungs CTAB. Gastrointestinal: Soft and nontender. No distention.  Musculoskeletal: No lower extremity tenderness nor edema. No gross deformities of extremities. Tenderness to palpation over the bilateral neck and shoulders. Somewhat limited ROM of the neck.  Neurologic:  Normal speech and language. No gross focal neurologic deficits are appreciated. Normal strength and sensation in the upper and lower extremities.  Skin:  Skin is warm, dry and intact. No rash noted.   ____________________________________________   LABS (all labs ordered are listed, but only abnormal results are displayed)  Labs Reviewed  BASIC METABOLIC PANEL - Abnormal; Notable for the following components:      Result Value   Chloride 100 (*)    Glucose, Bld 102 (*)    All other components within normal limits  CBC  CK   ____________________________________________  RADIOLOGY  Dg Chest 2 View  Result Date: 09/24/2017 CLINICAL DATA:  Cough. EXAM: CHEST  2 VIEW COMPARISON:  Chest x-ray  dated May 25, 2015. FINDINGS: The heart size and mediastinal contours are within normal limits. Both lungs are clear. The visualized skeletal structures are unremarkable. IMPRESSION: No active cardiopulmonary disease. Electronically Signed   By: Obie DredgeWilliam T Derry M.D.   On: 09/24/2017 14:54    ____________________________________________   PROCEDURES  Procedure(s) performed:   Procedures  None ____________________________________________   INITIAL IMPRESSION / ASSESSMENT AND PLAN / ED COURSE  Pertinent labs & imaging results that were available during my care of the patient were reviewed by me and considered in my medical decision making (see chart for details).  Patient presents to the emergency department for evaluation of neck pain and stiffness.  She had upper respiratory tract infection symptoms 2 weeks ago that resolved.  2 days ago she developed stiffness in the neck (bilateral), shoulders, and lower back.  Patient has not had fever or headache.  The stiffness is worse on the right and radiates slightly into the right arm. No neuro symptoms (weakness/numbness). Labs from triage reviewed with no acute findings.  Chest x-ray without pneumonia.  I have extremely low suspicion for meningitis in a patient with  symptoms of isolated neck/shoulder stiffness without fever, headache, lab abnormalities.  Do not feel that lumbar puncture is necessary.  I will add CK and reassess.  Plan for muscle relaxer at discharge and PCP follow-up with strict return precautions.   Labs including CK is negative. Plan for symptom mgmt and PCP follow up.   At this time, I do not feel there is any life-threatening condition present. I have reviewed and discussed all results (EKG, imaging, lab, urine as appropriate), exam findings with patient. I have reviewed nursing notes and appropriate previous records.  I feel the patient is safe to be discharged home without further emergent workup. Discussed usual and  customary return precautions. Patient and family (if present) verbalize understanding and are comfortable with this plan.  Patient will follow-up with their primary care provider. If they do not have a primary care provider, information for follow-up has been provided to them. All questions have been answered.  ____________________________________________  FINAL CLINICAL IMPRESSION(S) / ED DIAGNOSES  Final diagnoses:  Musculoskeletal pain     MEDICATIONS GIVEN DURING THIS VISIT:  Medications  methocarbamol (ROBAXIN) tablet 500 mg (500 mg Oral Given 09/24/17 1900)     NEW OUTPATIENT MEDICATIONS STARTED DURING THIS VISIT:  Discharge Medication List as of 09/24/2017  6:29 PM    START taking these medications   Details  methocarbamol (ROBAXIN) 500 MG tablet Take 1 tablet (500 mg total) by mouth every 8 (eight) hours as needed for muscle spasms., Starting Tue 09/24/2017, Print        Note:  This document was prepared using Dragon voice recognition software and may include unintentional dictation errors.  Alona Bene, MD Emergency Medicine    Rica Heather, Arlyss Repress, MD 09/24/17 1910

## 2017-10-01 DIAGNOSIS — M62830 Muscle spasm of back: Secondary | ICD-10-CM | POA: Diagnosis not present

## 2017-10-01 DIAGNOSIS — I959 Hypotension, unspecified: Secondary | ICD-10-CM | POA: Diagnosis not present

## 2017-10-01 DIAGNOSIS — M542 Cervicalgia: Secondary | ICD-10-CM | POA: Diagnosis not present

## 2017-10-01 DIAGNOSIS — I10 Essential (primary) hypertension: Secondary | ICD-10-CM | POA: Diagnosis not present

## 2017-10-16 ENCOUNTER — Ambulatory Visit
Admission: RE | Admit: 2017-10-16 | Discharge: 2017-10-16 | Disposition: A | Discharge status: Home / Self Care | Payer: PPO | Source: Ambulatory Visit | Attending: Family Medicine | Admitting: Family Medicine

## 2017-10-16 DIAGNOSIS — R921 Mammographic calcification found on diagnostic imaging of breast: Secondary | ICD-10-CM | POA: Diagnosis not present

## 2017-10-30 DIAGNOSIS — M79604 Pain in right leg: Secondary | ICD-10-CM | POA: Diagnosis not present

## 2017-10-30 DIAGNOSIS — Z0001 Encounter for general adult medical examination with abnormal findings: Secondary | ICD-10-CM | POA: Diagnosis not present

## 2017-10-30 DIAGNOSIS — N390 Urinary tract infection, site not specified: Secondary | ICD-10-CM | POA: Diagnosis not present

## 2017-10-30 DIAGNOSIS — J301 Allergic rhinitis due to pollen: Secondary | ICD-10-CM | POA: Diagnosis not present

## 2017-10-30 DIAGNOSIS — H10403 Unspecified chronic conjunctivitis, bilateral: Secondary | ICD-10-CM | POA: Diagnosis not present

## 2017-11-08 DIAGNOSIS — H1045 Other chronic allergic conjunctivitis: Secondary | ICD-10-CM | POA: Diagnosis not present

## 2017-12-05 IMAGING — DX DG LUMBAR SPINE COMPLETE 4+V
5 series · 5 of 5 positions shown · non-contrast
Comparison: None.

CLINICAL DATA: Chronic back pain.

EXAM:
LUMBAR SPINE - COMPLETE 4+ VIEW

[l-spine ap]
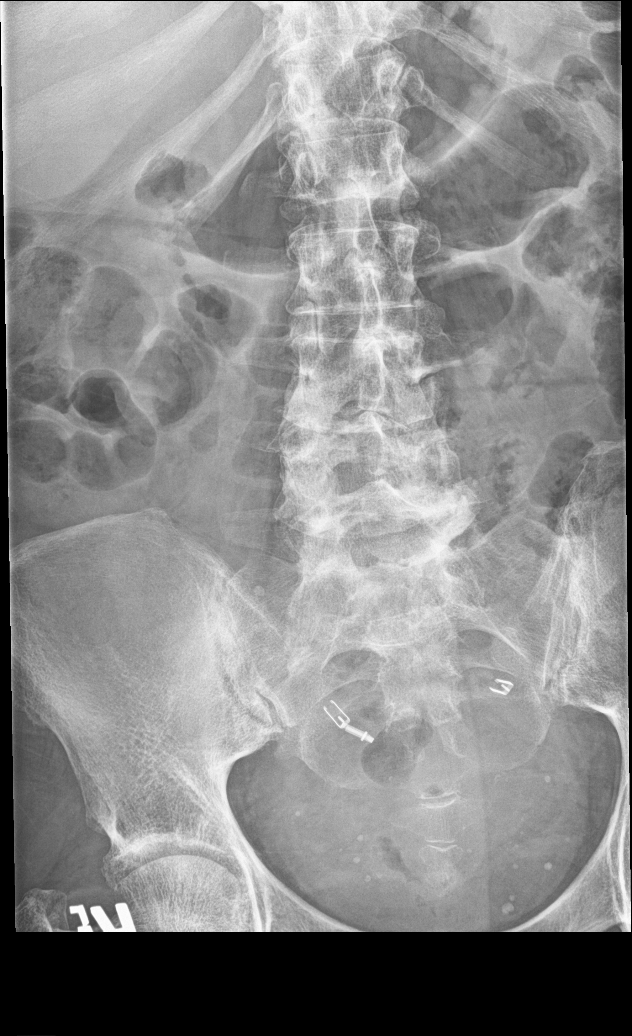

[l-spine obl (1 of 2)]
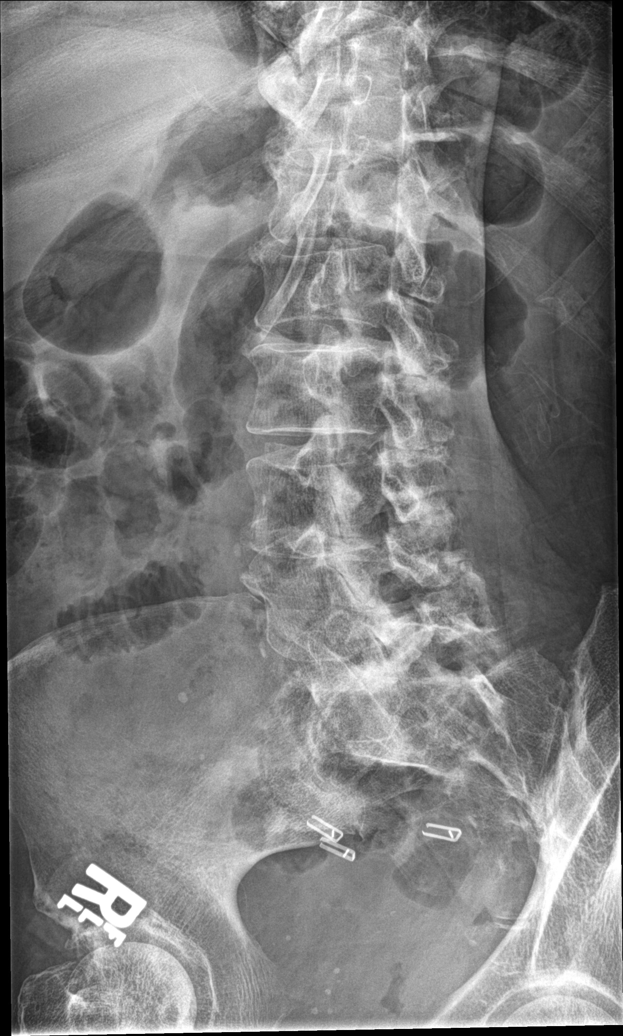

[l-spine obl (2 of 2)]
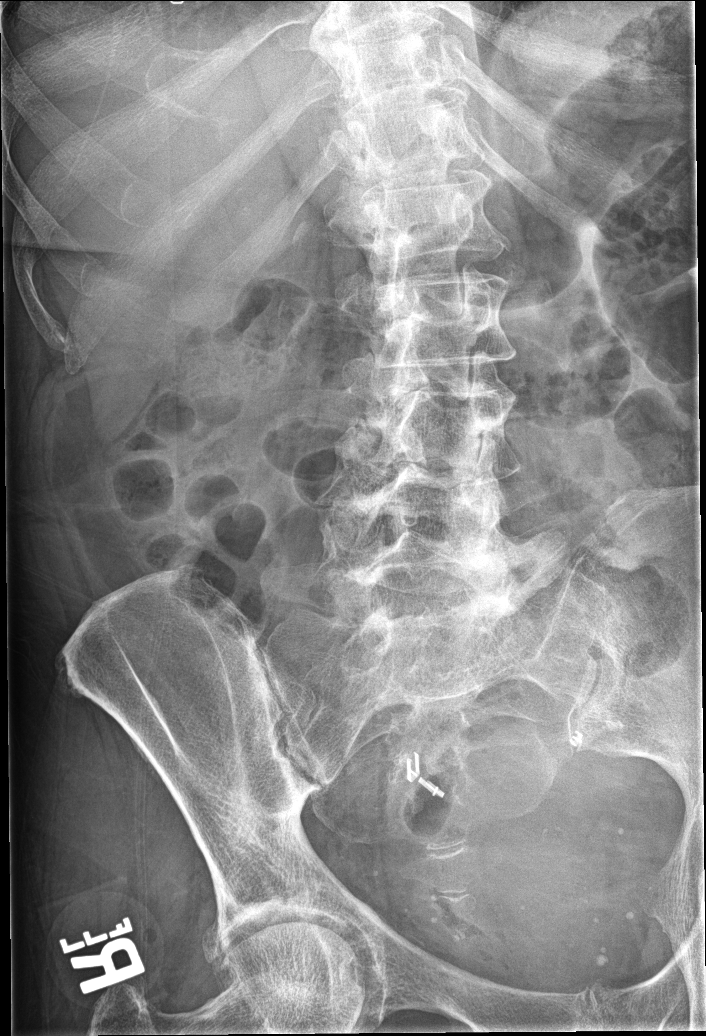

[l-spine lat]
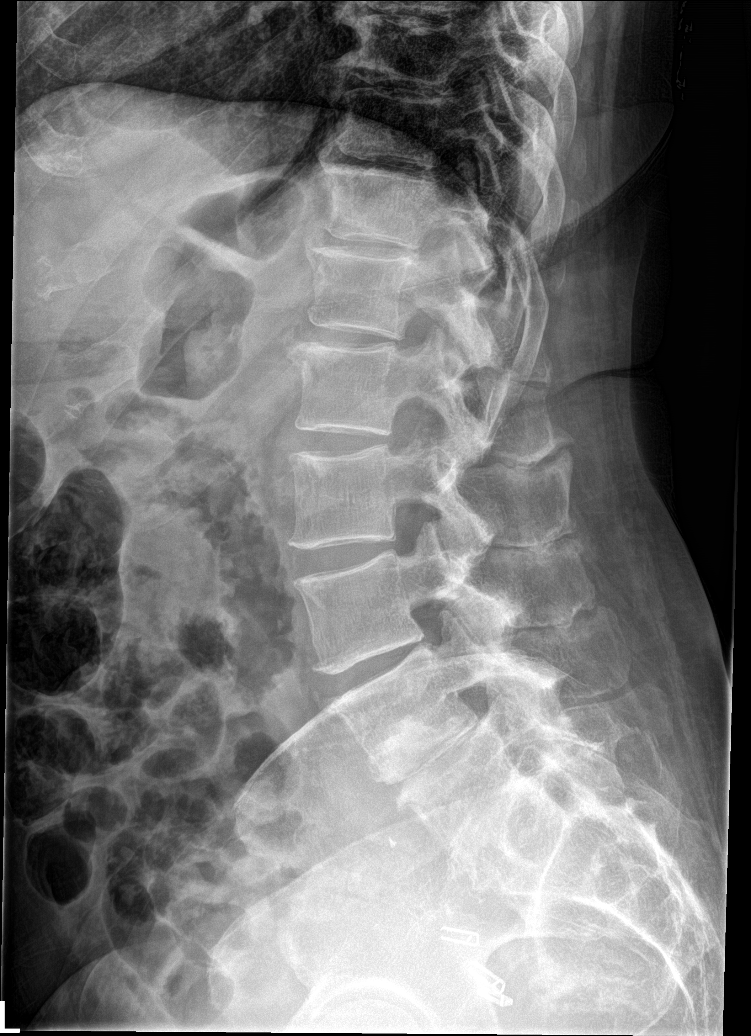

[l-spine spot]
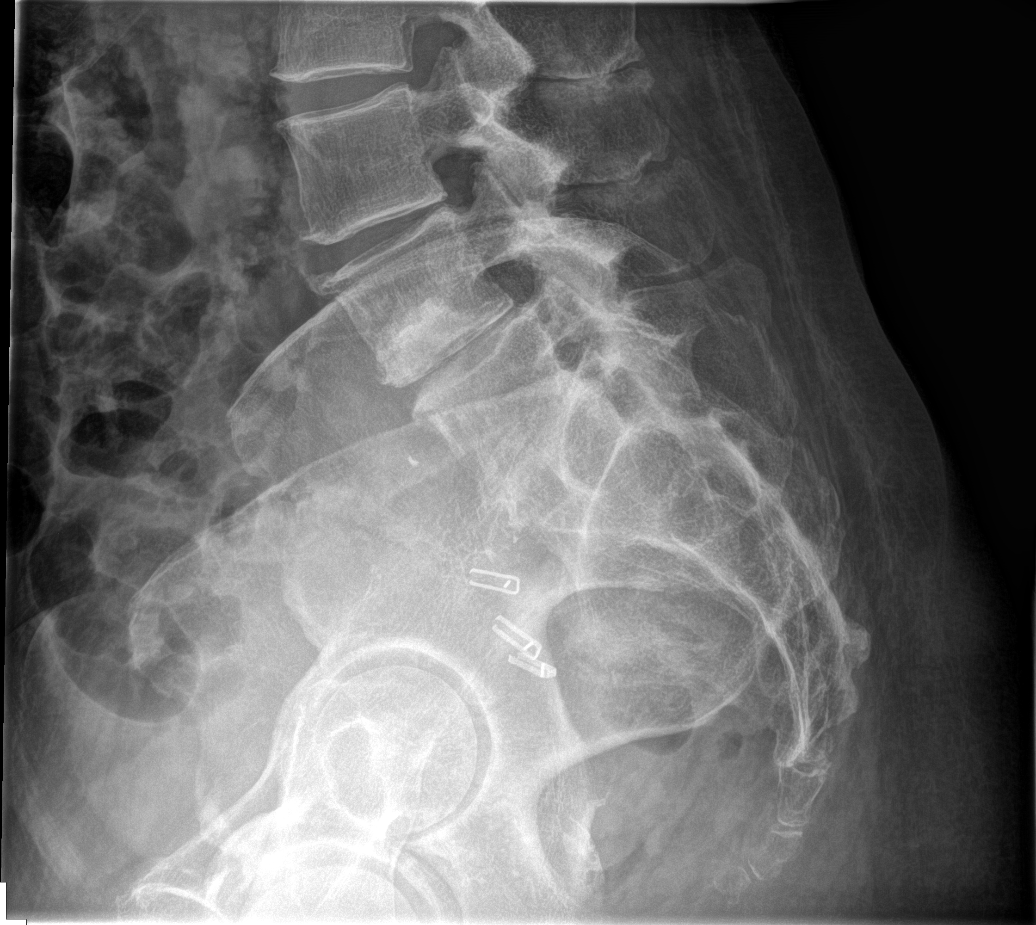

[5 of 5 positions shown; findings below may reference images not displayed]

FINDINGS: There is no evidence of lumbar spine fracture. Alignment is normal.
Multilevel osteoarthritic changes of the lumbosacral spine and the
visualized portion of the lower thoracic spine with disc space
narrowing, endplate sclerosis, mild remodeling of vertebral bodies
and osteophyte formation. Associated posterior facet arthropathy in
the lower lumbosacral spine.
IMPRESSION: Multilevel osteoarthritic changes of the lumbosacral spine, mild to
moderate, involving all levels.

Posterior facet arthropathy of the lower lumbosacral spine.

## 2018-01-01 ENCOUNTER — Ambulatory Visit: Payer: PPO | Admitting: Nurse Practitioner

## 2018-01-01 ENCOUNTER — Encounter: Payer: Self-pay | Admitting: Nurse Practitioner

## 2018-01-01 VITALS — BP 111/71 | HR 65 | Temp 97.6°F | Ht 65.0 in | Wt 141.2 lb

## 2018-01-01 DIAGNOSIS — K2970 Gastritis, unspecified, without bleeding: Secondary | ICD-10-CM | POA: Diagnosis not present

## 2018-01-01 DIAGNOSIS — K2990 Gastroduodenitis, unspecified, without bleeding: Secondary | ICD-10-CM

## 2018-01-01 DIAGNOSIS — R1310 Dysphagia, unspecified: Secondary | ICD-10-CM | POA: Diagnosis not present

## 2018-01-01 DIAGNOSIS — R1319 Other dysphagia: Secondary | ICD-10-CM

## 2018-01-01 NOTE — Progress Notes (Signed)
Referring Provider: Gareth Morgan, MD Primary Care Physician:  Gareth Morgan, MD Primary GI:  Dr. Darrick Penna  Chief Complaint  Patient presents with  . Gastroesophageal Reflux    doing better    HPI:   Jasmine Rhodes is a 72 y.o. female who presents for follow-up on GERD and dysphasia.  The patient was last seen in our office 07/03/2017 for the same as well as abdominal pain.  Colonoscopy and EGD up-to-date 04/25/2017.  EGD with moderate Schatzki's ring status post dilation, NSAID gastritis/duodenitis.  Recommended Prilosec daily, avoid NSAIDs.  Colonoscopy essentially normal. No repeat due to age. At her last visit she is doing okay with improvement in dysphagia after dilation.  GERD symptoms better on Prilosec with no breakthrough.  Having decreased appetite and would like help with her diet.  We offered nutrition consult and she accepted.  Has had unintentional weight loss which she attributes to stress and caregiving for her mom, feels she has lost 30 pounds in the past 3 years.  No other GI symptoms.  Recommend current medications, follow-up in 6 months.  No dietitian visit found in our system.  Today she states she's doing well overall. GERD is significantly improved. No breakthrough. Wants to stay on PPI. Denies abdominal pain, N/V, hematochezia, melena, unintentional weight loss. She has lost another 3 lbs objectively in the past 6 months. Appetite is good sometimes and sometimes not. Still under a lot of stress. No further dysphagia. Denies chest pain, dyspnea, dizziness, lightheadedness, syncope, near syncope. Denies any other upper or lower GI symptoms.  Past Medical History:  Diagnosis Date  . Asthma   . DDD (degenerative disc disease), lumbar   . Hyperlipidemia   . Hypertension   . Osteoarthritis     Past Surgical History:  Procedure Laterality Date  . CARPAL TUNNEL RELEASE Right 03/30/2017  . COLONOSCOPY    . COLONOSCOPY N/A 04/25/2017   Procedure: COLONOSCOPY;   Surgeon: West Bali, MD;  Location: [facility];  Service: Endoscopy;  Laterality: N/A;  1200  . DG THUMB LEFT HAND    . ESOPHAGOGASTRODUODENOSCOPY N/A 04/25/2017   Procedure: ESOPHAGOGASTRODUODENOSCOPY (EGD);  Surgeon: West Bali, MD;  Location: [facility];  Service: Endoscopy;  Laterality: N/A;  . SAVORY DILATION N/A 04/25/2017   Procedure: SAVORY DILATION;  Surgeon: West Bali, MD;  Location: [facility];  Service: Endoscopy;  Laterality: N/A;  . tonsils removal      Current Outpatient Medications  Medication Sig Dispense Refill  . albuterol (PROVENTIL) (2.5 MG/3ML) 0.083% nebulizer solution Take 2.5 mg by nebulization every 6 (six) hours as needed for wheezing or shortness of breath.    . Ascorbic Acid (VITAMIN C PO) Take 1 tablet by mouth daily.    Marland Kitchen aspirin EC 81 MG tablet Take 81 mg by mouth daily.    Marland Kitchen atenolol (TENORMIN) 50 MG tablet Take 50 mg by mouth daily.    . cetirizine (ZYRTEC) 10 MG tablet Take 10 mg by mouth daily as needed.     . Cholecalciferol (VITAMIN D PO) Take 1 tablet by mouth daily.    . Cyanocobalamin (VITAMIN B-12 PO) Take 1 tablet by mouth daily.    . fluticasone (FLONASE) 50 MCG/ACT nasal spray Place 1 spray into both nostrils as needed for allergies.     . naproxen sodium (ANAPROX) 220 MG tablet Take 220 mg by mouth as needed.    Marland Kitchen omeprazole (PRILOSEC) 20 MG capsule 1 PO 30 MINS PRIOR  TO BREAKFAST. 90 capsule 3  . Polyethyl Glycol-Propyl Glycol (SYSTANE ULTRA OP) Apply 1 drop to eye 2 (two) times daily as needed.    . pravastatin (PRAVACHOL) 20 MG tablet Take 20 mg by mouth daily.    . Pyridoxine HCl (VITAMIN B-6 PO) Take 1 tablet by mouth daily.    Marland Kitchen. triamterene-hydrochlorothiazide (DYAZIDE) 37.5-25 MG capsule Take 1 capsule by mouth daily.     No current facility-administered medications for this visit.     Allergies as of 01/01/2018 - Review Complete 01/01/2018  Allergen Reaction Noted  . Sulfa antibiotics Rash 01/11/2017     Family History  Problem Relation Age of Onset  . Hypertension Mother   . Colon cancer Brother [id number]       Half-brother related by her mother    Social History   Socioeconomic History  . Marital status: Divorced    Spouse name: Not on file  . Number of children: Not on file  . Years of education: Not on file  . Highest education level: Not on file  Occupational History  . Not on file  Social Needs  . Financial resource strain: Not on file  . Food insecurity:    Worry: Not on file    Inability: Not on file  . Transportation needs:    Medical: Not on file    Non-medical: Not on file  Tobacco Use  . Smoking status: Current Every Day Smoker    Packs/day: 0.25    Years: 40.00    Pack years: 10.00    Types: Cigarettes  . Smokeless tobacco: Never Used  Substance and Sexual Activity  . Alcohol use: Yes    Comment: occasional  . Drug use: No  . Sexual activity: Not on file  Lifestyle  . Physical activity:    Days per week: Not on file    Minutes per session: Not on file  . Stress: Not on file  Relationships  . Social connections:    Talks on phone: Not on file    Gets together: Not on file    Attends religious service: Not on file    Active member of club or organization: Not on file    Attends meetings of clubs or organizations: Not on file    Relationship status: Not on file  Other Topics Concern  . Not on file  Social History Narrative  . Not on file    Review of Systems: Complete ROS negative except as per HPI.   Physical Exam: BP 111/71   Pulse 65   Temp 97.6 F (36.4 C) (Oral)   Ht 5\' 5"  (1.651 m)   Wt 141 lb 3.2 oz (64 kg)   BMI 23.50 kg/m  General:   Alert and oriented. Pleasant and cooperative. Well-nourished and well-developed.  Eyes:  Without icterus, sclera clear and conjunctiva pink.  Ears:  Normal auditory acuity. Cardiovascular:  S1, S2 present without murmurs appreciated. Extremities without clubbing or edema. Respiratory:  Clear to  auscultation bilaterally. No wheezes, rales, or rhonchi. No distress.  Gastrointestinal:  +BS, soft, non-tender and non-distended. No HSM noted. No guarding or rebound. No masses appreciated.  Rectal:  Deferred  Musculoskalatal:  Symmetrical without gross deformities. Neurologic:  Alert and oriented x4;  grossly normal neurologically. Psych:  Alert and cooperative. Normal mood and affect. Heme/Lymph/Immune: No excessive bruising noted.    01/01/2018 11:18 AM   Disclaimer: This note was dictated with voice recognition software. Similar sounding words can inadvertently be transcribed and  may not be corrected upon review.

## 2018-01-01 NOTE — Patient Instructions (Signed)
1. Continue taking your current medications. 2. Return for follow-up in 1 year. 3. Call us if you have any questions or concerns.  At [facility] we value your feedback. You may receive a survey about your visit today. Please share your experience as we strive to create trusting relationships with our patients to provide genuine, compassionate, quality care.  It was great to see you today!  I hope you have a wonderful summer!!

## 2018-01-02 NOTE — Assessment & Plan Note (Signed)
Symptoms are doing well currently.  She wants to stay on a PPI.  I feel that is appropriate, given her wishes.  At this point I will have her continue her current medications.  We will have her follow-up in 1 year.  At that time we can assess and see if we want to try to back off on PPI and possibly discontinue it.  This will depend on how she progresses over the next year.

## 2018-01-02 NOTE — Assessment & Plan Note (Signed)
Denies any further dysphagia since EGD status post dilation.  Recommend she continue PPI, which is her request at this time.  We will have her follow-up in 1 year and see how she is progressed.  We can consider possibly reducing or stopping her PPI at that time.

## 2018-01-03 NOTE — Progress Notes (Signed)
CC'D TO PCP °

## 2018-03-04 DIAGNOSIS — M533 Sacrococcygeal disorders, not elsewhere classified: Secondary | ICD-10-CM | POA: Diagnosis not present

## 2018-03-04 DIAGNOSIS — M542 Cervicalgia: Secondary | ICD-10-CM | POA: Diagnosis not present

## 2018-03-04 DIAGNOSIS — M170 Bilateral primary osteoarthritis of knee: Secondary | ICD-10-CM | POA: Diagnosis not present

## 2018-03-04 DIAGNOSIS — M25511 Pain in right shoulder: Secondary | ICD-10-CM | POA: Diagnosis not present

## 2018-03-12 ENCOUNTER — Ambulatory Visit (HOSPITAL_COMMUNITY)
Admission: RE | Admit: 2018-03-12 | Discharge: 2018-03-12 | Disposition: A | Discharge status: Home / Self Care | Payer: PPO | Source: Ambulatory Visit | Attending: Family Medicine | Admitting: Family Medicine

## 2018-03-12 ENCOUNTER — Other Ambulatory Visit (HOSPITAL_COMMUNITY): Payer: Self-pay | Admitting: Family Medicine

## 2018-03-12 DIAGNOSIS — M19012 Primary osteoarthritis, left shoulder: Secondary | ICD-10-CM | POA: Insufficient documentation

## 2018-03-12 DIAGNOSIS — M542 Cervicalgia: Secondary | ICD-10-CM | POA: Diagnosis not present

## 2018-03-12 DIAGNOSIS — M47812 Spondylosis without myelopathy or radiculopathy, cervical region: Secondary | ICD-10-CM | POA: Diagnosis not present

## 2018-03-12 DIAGNOSIS — M533 Sacrococcygeal disorders, not elsewhere classified: Secondary | ICD-10-CM | POA: Diagnosis not present

## 2018-03-12 DIAGNOSIS — M170 Bilateral primary osteoarthritis of knee: Secondary | ICD-10-CM | POA: Diagnosis not present

## 2018-03-12 DIAGNOSIS — M2578 Osteophyte, vertebrae: Secondary | ICD-10-CM | POA: Diagnosis not present

## 2018-03-12 DIAGNOSIS — M25511 Pain in right shoulder: Secondary | ICD-10-CM

## 2018-03-12 DIAGNOSIS — I10 Essential (primary) hypertension: Secondary | ICD-10-CM | POA: Diagnosis not present

## 2018-03-12 DIAGNOSIS — E663 Overweight: Secondary | ICD-10-CM | POA: Diagnosis not present

## 2018-03-12 DIAGNOSIS — E785 Hyperlipidemia, unspecified: Secondary | ICD-10-CM | POA: Diagnosis not present

## 2018-03-19 DIAGNOSIS — D72810 Lymphocytopenia: Secondary | ICD-10-CM | POA: Diagnosis not present

## 2018-03-19 DIAGNOSIS — M542 Cervicalgia: Secondary | ICD-10-CM | POA: Diagnosis not present

## 2018-03-19 DIAGNOSIS — M25511 Pain in right shoulder: Secondary | ICD-10-CM | POA: Diagnosis not present

## 2018-03-19 DIAGNOSIS — R7301 Impaired fasting glucose: Secondary | ICD-10-CM | POA: Diagnosis not present

## 2018-03-20 DIAGNOSIS — E785 Hyperlipidemia, unspecified: Secondary | ICD-10-CM | POA: Diagnosis not present

## 2018-03-20 DIAGNOSIS — G4733 Obstructive sleep apnea (adult) (pediatric): Secondary | ICD-10-CM | POA: Diagnosis not present

## 2018-03-20 DIAGNOSIS — E663 Overweight: Secondary | ICD-10-CM | POA: Diagnosis not present

## 2018-03-20 DIAGNOSIS — I10 Essential (primary) hypertension: Secondary | ICD-10-CM | POA: Diagnosis not present

## 2018-04-29 ENCOUNTER — Encounter (HOSPITAL_COMMUNITY): Payer: Self-pay | Admitting: Emergency Medicine

## 2018-04-29 ENCOUNTER — Other Ambulatory Visit: Payer: Self-pay

## 2018-04-29 ENCOUNTER — Emergency Department (HOSPITAL_COMMUNITY): Payer: PPO

## 2018-04-29 ENCOUNTER — Emergency Department (HOSPITAL_COMMUNITY)
Admission: EM | Admit: 2018-04-29 | Discharge: 2018-04-29 | Disposition: A | Discharge status: Home / Self Care | Payer: PPO | Attending: Emergency Medicine | Admitting: Emergency Medicine

## 2018-04-29 DIAGNOSIS — R0602 Shortness of breath: Secondary | ICD-10-CM | POA: Diagnosis not present

## 2018-04-29 DIAGNOSIS — J45909 Unspecified asthma, uncomplicated: Secondary | ICD-10-CM | POA: Insufficient documentation

## 2018-04-29 DIAGNOSIS — R05 Cough: Secondary | ICD-10-CM | POA: Diagnosis not present

## 2018-04-29 DIAGNOSIS — Z79899 Other long term (current) drug therapy: Secondary | ICD-10-CM | POA: Diagnosis not present

## 2018-04-29 DIAGNOSIS — R059 Cough, unspecified: Secondary | ICD-10-CM

## 2018-04-29 DIAGNOSIS — I10 Essential (primary) hypertension: Secondary | ICD-10-CM | POA: Insufficient documentation

## 2018-04-29 DIAGNOSIS — R112 Nausea with vomiting, unspecified: Secondary | ICD-10-CM | POA: Diagnosis not present

## 2018-04-29 DIAGNOSIS — F17210 Nicotine dependence, cigarettes, uncomplicated: Secondary | ICD-10-CM | POA: Diagnosis not present

## 2018-04-29 DIAGNOSIS — Z7982 Long term (current) use of aspirin: Secondary | ICD-10-CM | POA: Insufficient documentation

## 2018-04-29 LAB — COMPREHENSIVE METABOLIC PANEL
ALT: 12 U/L (ref 0–44)
AST: 18 U/L (ref 15–41)
Albumin: 4.2 g/dL (ref 3.5–5.0)
Alkaline Phosphatase: 64 U/L (ref 38–126)
Anion gap: 10 (ref 5–15)
BUN: 11 mg/dL (ref 8–23)
CO2: 27 mmol/L (ref 22–32)
Calcium: 9 mg/dL (ref 8.9–10.3)
Chloride: 104 mmol/L (ref 98–111)
Creatinine, Ser: 0.62 mg/dL (ref 0.44–1.00)
GFR calc Af Amer: >60 mL/min (ref >60)
GFR calc non Af Amer: >60 mL/min (ref >60)
Glucose, Bld: 100 mg/dL — ABNORMAL HIGH (ref 70–99)
Potassium: 3.2 mmol/L — ABNORMAL LOW (ref 3.5–5.1)
Sodium: 141 mmol/L (ref 135–145)
Total Bilirubin: 1.1 mg/dL (ref 0.3–1.2)
Total Protein: 7.1 g/dL (ref 6.5–8.1)

## 2018-04-29 LAB — CBC
HCT: 42.6 % (ref 36.0–46.0)
Hemoglobin: 14.2 g/dL (ref 12.0–15.0)
MCH: 32.1 pg (ref 26.0–34.0)
MCHC: 33.3 g/dL (ref 30.0–36.0)
MCV: 96.2 fL (ref 78.0–100.0)
Platelets: 225 10*3/uL (ref 150–400)
RBC: 4.43 MIL/uL (ref 3.87–5.11)
RDW: 12.8 % (ref 11.5–15.5)
WBC: 8.8 10*3/uL (ref 4.0–10.5)

## 2018-04-29 LAB — URINALYSIS, ROUTINE W REFLEX MICROSCOPIC
Bacteria, UA: NONE SEEN
Bilirubin Urine: NEGATIVE
Glucose, UA: NEGATIVE mg/dL
Ketones, ur: NEGATIVE mg/dL
Leukocytes, UA: NEGATIVE
Nitrite: NEGATIVE
Protein, ur: NEGATIVE mg/dL
Specific Gravity, Urine: 1.006 (ref 1.005–1.030)
pH: 5 (ref 5.0–8.0)

## 2018-04-29 LAB — LIPASE, BLOOD: Lipase: 61 U/L — ABNORMAL HIGH (ref 11–51)

## 2018-04-29 MED ORDER — PANTOPRAZOLE SODIUM 20 MG PO TBEC: 20.0000 mg | DELAYED_RELEASE_TABLET | Freq: Every day | ORAL | 0 refills | Status: DC

## 2018-04-29 MED ORDER — ONDANSETRON 4 MG PO TBDP
4.0000 mg | ORAL_TABLET | Freq: Once | ORAL | Status: AC
Start: 2018-04-29 — End: 2018-04-29
  Administered 2018-04-29: 4 mg via ORAL
  Filled 2018-04-29: qty 1

## 2018-04-29 MED ORDER — ONDANSETRON HCL 4 MG PO TABS: 4.0000 mg | ORAL_TABLET | Freq: Three times a day (TID) | ORAL | 0 refills | Status: DC | PRN

## 2018-04-29 MED ORDER — IPRATROPIUM-ALBUTEROL 0.5-2.5 (3) MG/3ML IN SOLN
3.0000 mL | Freq: Once | RESPIRATORY_TRACT | Status: AC
  Administered 2018-04-29: 3 mL via RESPIRATORY_TRACT
  Filled 2018-04-29: qty 3

## 2018-04-29 NOTE — ED Notes (Signed)
Pt is SOB, coughing, feels heaviness in chest

## 2018-04-29 NOTE — ED Notes (Signed)
Lab at the bedside, pt sob, states she has been coughing up clear mucus.

## 2018-04-29 NOTE — ED Triage Notes (Signed)
Pt vomited a couple of times today.  Has also had chills.  Was sent by pcp for further evaluation. Denies any pain.

## 2018-04-29 NOTE — ED Notes (Signed)
02 sat 100%, pt still complaining of SOB, pt taking short breathe, pt is very anxious, advise to slow deep breath

## 2018-05-02 DIAGNOSIS — E876 Hypokalemia: Secondary | ICD-10-CM | POA: Diagnosis not present

## 2018-05-02 DIAGNOSIS — J301 Allergic rhinitis due to pollen: Secondary | ICD-10-CM | POA: Diagnosis not present

## 2018-05-02 DIAGNOSIS — J209 Acute bronchitis, unspecified: Secondary | ICD-10-CM | POA: Diagnosis not present

## 2018-05-02 DIAGNOSIS — I10 Essential (primary) hypertension: Secondary | ICD-10-CM | POA: Diagnosis not present

## 2018-05-09 NOTE — ED Provider Notes (Signed)
[facility] EMERGENCY DEPARTMENT Provider Note   CSN: [id number] Arrival date & time: 04/29/18  1547     History   Chief Complaint Chief Complaint  Patient presents with  . Cough    HPI Jasmine Rhodes is a 72 y.o. female.  HPI   72 year old female with multiple complaints.  Primarily nausea and vomiting.  Onset today.  Chills.  No fever.  Denies any pain.  She feels short of breath and has been coughing.   Past Medical History:  Diagnosis Date  . Asthma   . DDD (degenerative disc disease), lumbar   . Hyperlipidemia   . Hypertension   . Osteoarthritis     Patient Active Problem List   Diagnosis Date Noted  . Gastritis and gastroduodenitis   . History of colonic polyps 03/11/2017  . Chronic RLQ pain 03/11/2017  . Abdominal pain, epigastric 03/11/2017  . Esophageal dysphagia 03/11/2017  . HTN (hypertension) 01/11/2017  . Overweight 01/11/2017  . Hyperlipidemia 01/11/2017  . Allergic rhinitis 01/11/2017  . Mild intermittent asthma 01/11/2017  . Osteoarthritis of both knees 01/11/2017  . Carpal tunnel syndrome, right 01/11/2017  . Chest pain 01/11/2017  . Stress 01/11/2017  . Lumbago with sciatica, right side 01/11/2017    Past Surgical History:  Procedure Laterality Date  . CARPAL TUNNEL RELEASE Right 03/30/2017  . COLONOSCOPY    . COLONOSCOPY N/A 04/25/2017   Procedure: COLONOSCOPY;  Surgeon: West Bali, MD;  Location: [facility];  Service: Endoscopy;  Laterality: N/A;  1200  . DG THUMB LEFT HAND    . ESOPHAGOGASTRODUODENOSCOPY N/A 04/25/2017   Procedure: ESOPHAGOGASTRODUODENOSCOPY (EGD);  Surgeon: West Bali, MD;  Location: [facility];  Service: Endoscopy;  Laterality: N/A;  . SAVORY DILATION N/A 04/25/2017   Procedure: SAVORY DILATION;  Surgeon: West Bali, MD;  Location: [facility];  Service: Endoscopy;  Laterality: N/A;  . tonsils removal       OB History   None      Home Medications    Prior to Admission medications     Medication Sig Start Date End Date Taking? Authorizing Provider  acetaminophen (TYLENOL) 325 MG tablet Take 650 mg by mouth every 6 (six) hours as needed for moderate pain.   Yes [provider]  albuterol (PROVENTIL) (2.5 MG/3ML) 0.083% nebulizer solution Take 2.5 mg by nebulization every 6 (six) hours as needed for wheezing or shortness of breath.   Yes [provider]  Ascorbic Acid (VITAMIN C PO) Take 1 tablet by mouth daily.   Yes [provider]  aspirin EC 81 MG tablet Take 81 mg by mouth daily.   Yes [provider]  atenolol (TENORMIN) 50 MG tablet Take 25 mg by mouth daily.    Yes [provider]  cetirizine (ZYRTEC) 10 MG tablet Take 10 mg by mouth daily as needed for allergies.    Yes [provider]  cholecalciferol (VITAMIN D) 1000 units tablet Take 1,000 Units by mouth daily.   Yes [provider]  Cyanocobalamin (VITAMIN B-12 PO) Take 1 tablet by mouth daily.   Yes [provider]  diclofenac sodium (VOLTAREN) 1 % GEL Apply 2 g topically daily as needed (for knee pain).  03/19/18  Yes [provider]  fluticasone (FLONASE) 50 MCG/ACT nasal spray Place 1 spray into both nostrils as needed for allergies.    Yes [provider]  hydrocortisone cream 1 % Apply 1 application topically 2 (two) times daily.  Yes [provider]  omeprazole (PRILOSEC) 20 MG capsule 1 PO 30 MINS PRIOR TO BREAKFAST. Patient taking differently: Take 20 mg by mouth daily as needed (for acid reflux/GERD).  04/25/17  Yes Fields, Darleene Cleaver, MD  Polyethyl Glycol-Propyl Glycol (SYSTANE ULTRA OP) Apply 1 drop to eye 2 (two) times daily as needed (dry eye relief).    Yes [provider]  pravastatin (PRAVACHOL) 20 MG tablet Take 20 mg by mouth daily.   Yes [provider]  triamterene-hydrochlorothiazide (DYAZIDE) 37.5-25 MG capsule Take 1 capsule by mouth daily.   Yes [provider]   ondansetron (ZOFRAN) 4 MG tablet Take 1 tablet (4 mg total) by mouth every 8 (eight) hours as needed for nausea or vomiting. 04/29/18   Raeford Razor, MD  pantoprazole (PROTONIX) 20 MG tablet Take 1 tablet (20 mg total) by mouth daily. 04/29/18   Raeford Razor, MD    Family History Family History  Problem Relation Age of Onset  . Hypertension Mother   . Colon cancer Brother 10       Half-brother related by her mother    Social History Social History   Tobacco Use  . Smoking status: Current Every Day Smoker    Packs/day: 0.25    Years: 40.00    Pack years: 10.00    Types: Cigarettes  . Smokeless tobacco: Never Used  Substance Use Topics  . Alcohol use: Yes    Comment: occasional  . Drug use: No     Allergies   Sulfa antibiotics   Review of Systems Review of Systems  All systems reviewed and negative, other than as noted in HPI.  Physical Exam Updated Vital Signs BP (!) 143/74   Pulse 94   Temp 98.2 F (36.8 C) (Oral)   Resp 20   Ht 5\' 5"  (1.651 m)   Wt 63.5 kg   SpO2 98%   BMI 23.30 kg/m   Physical Exam  Constitutional: She appears well-developed and well-nourished. No distress.  HENT:  Head: Normocephalic and atraumatic.  Eyes: Conjunctivae are normal. Right eye exhibits no discharge. Left eye exhibits no discharge.  Neck: Neck supple.  Cardiovascular: Normal rate, regular rhythm and normal heart sounds. Exam reveals no gallop and no friction rub.  No murmur heard. Pulmonary/Chest: Effort normal and breath sounds normal. No respiratory distress.  Abdominal: Soft. She exhibits no distension. There is no tenderness.  Musculoskeletal: She exhibits no edema or tenderness.  Neurological: She is alert.  Skin: Skin is warm and dry.  Psychiatric: She has a normal mood and affect. Her behavior is normal. Thought content normal.  Nursing note and vitals reviewed.    ED Treatments / Results  Labs (all labs ordered are listed, but only abnormal results are  displayed) Labs Reviewed  LIPASE, BLOOD - Abnormal; Notable for the following components:      Result Value   Lipase 61 (*)    All other components within normal limits  COMPREHENSIVE METABOLIC PANEL - Abnormal; Notable for the following components:   Potassium 3.2 (*)    Glucose, Bld 100 (*)    All other components within normal limits  URINALYSIS, ROUTINE W REFLEX MICROSCOPIC - Abnormal; Notable for the following components:   Color, Urine STRAW (*)    Hgb urine dipstick SMALL (*)    All other components within normal limits  CBC    EKG None  Radiology No results found.  Procedures Procedures (including critical care time)  Medications Ordered in ED  Medications  ondansetron (ZOFRAN-ODT) disintegrating tablet 4 mg (4 mg Oral Given 04/29/18 1801)  ipratropium-albuterol (DUONEB) 0.5-2.5 (3) MG/3ML nebulizer solution 3 mL (3 mLs Nebulization Given 04/29/18 1836)     Initial Impression / Assessment and Plan / ED Course  I have reviewed the triage vital signs and the nursing notes.  Pertinent labs & imaging results that were available during my care of the patient were reviewed by me and considered in my medical decision making (see chart for details).     72 year old female with cough and nausea vomiting.  Now improved.  Exam is reassuring.  She is no acute distress.  I doubt emergent process.  Final Clinical Impressions(s) / ED Diagnoses   Final diagnoses:  Cough  Nausea and vomiting, intractability of vomiting not specified, unspecified vomiting type    ED Discharge Orders         Ordered    ondansetron (ZOFRAN) 4 MG tablet  Every 8 hours PRN     04/29/18 1906    pantoprazole (PROTONIX) 20 MG tablet  Daily     04/29/18 1915           Raeford Razor, MD 05/09/18 2343

## 2018-05-19 DIAGNOSIS — E876 Hypokalemia: Secondary | ICD-10-CM | POA: Diagnosis not present

## 2018-05-19 DIAGNOSIS — I10 Essential (primary) hypertension: Secondary | ICD-10-CM | POA: Diagnosis not present

## 2018-05-19 DIAGNOSIS — E785 Hyperlipidemia, unspecified: Secondary | ICD-10-CM | POA: Diagnosis not present

## 2018-05-19 DIAGNOSIS — J209 Acute bronchitis, unspecified: Secondary | ICD-10-CM | POA: Diagnosis not present

## 2018-06-03 DIAGNOSIS — E876 Hypokalemia: Secondary | ICD-10-CM | POA: Diagnosis not present

## 2018-06-03 DIAGNOSIS — Z23 Encounter for immunization: Secondary | ICD-10-CM | POA: Diagnosis not present

## 2018-06-03 DIAGNOSIS — J209 Acute bronchitis, unspecified: Secondary | ICD-10-CM | POA: Diagnosis not present

## 2018-06-12 DIAGNOSIS — M25512 Pain in left shoulder: Secondary | ICD-10-CM | POA: Diagnosis not present

## 2018-06-12 DIAGNOSIS — M19012 Primary osteoarthritis, left shoulder: Secondary | ICD-10-CM | POA: Diagnosis not present

## 2018-06-12 DIAGNOSIS — N765 Ulceration of vagina: Secondary | ICD-10-CM | POA: Diagnosis not present

## 2018-06-18 ENCOUNTER — Other Ambulatory Visit: Payer: Self-pay | Admitting: Pharmacist

## 2018-06-18 NOTE — Patient Outreach (Signed)
[facility]) Care Management  [facility]   06/18/2018  Javeah Loeza 1946/05/11 [id number]  Subjective: Incoming call from Adam Phenix in response to the [facility] Medication Adherence Campaign. Speak with patient. HIPAA identifiers verified and verbal consent received.   Medication Adherence   Ms. Meiser reports that she takes her pravastatin 20 mg once daily as directed. Denies any barriers to adherence. Reports that she might miss a dose occasionally. Counsel patient on the importance of adherence to this medication and on the value of using a weekly pillbox to aid with adherence. Patient verbalizes understanding and states that she is going to start using a weekly pillbox.   Medication Management  Patient reports that she was recently seen at Urgent Care for arthritis pain. Reports that she was prescribed two medications, acyclovir and meloxicam. Counsel patient that acyclovir is an antiviral. Patient reports that she had did have some vaginal discomfort that she mentioned at the appointment, but that she was told that she would receive a call back related to the labwork that she had done for this complaint. Reports that she has not yet received a call back. Encourage patient to call to follow up with the Urgent Care herself. Patient states that she will call to follow up when we hang up.  Patient was prescribed meloxicam 15 mg from Urgent Care to be taken once daily. Counsel patient about this medication, precautions, interactions and to always take it with food to protect her stomach. Review patient's medications with her. Note that patient is currently also prescribed diclofenac gel for muscle pain. Patient states that she will try the diclofenac gel first, rather than the daily meloxicam, and follow up with her PCP if this does not control the pain.  Patient reports that using loratadine daily sometimes gives her dry mouth. Counsel patient about strategies for  managing dry mouth, including Biotene spray and rinse. Counsel patient about the interaction between this agent and her potassium chloride. Patient reports that she often opens the capsule and sprinkles it in apple sauce to make it easier to swallow. Encourage patient to continue to do this to avoid the interaction.  Patient reports that she has not recently been taking her calcium with Vitamin D. Encourage patient to restart taking this as directed by her PCP and about how to administer it for best absorption.   Encourage patient to check her blood pressure at home, keep a log and bring this with her to her medical appointments.  Patient denies any further medication questions/concerns at this time. Provide patient with my phone number.  Objective:   Encounter Medications: Outpatient Encounter Medications as of 06/18/2018  Medication Sig Note  . acetaminophen (TYLENOL) 325 MG tablet Take 650 mg by mouth every 6 (six) hours as needed for moderate pain.   Marland Kitchen acyclovir (ZOVIRAX) 400 MG tablet Take 1 tablet by mouth 3 (three) times daily.   Marland Kitchen albuterol (PROVENTIL) (2.5 MG/3ML) 0.083% nebulizer solution Take 2.5 mg by nebulization every 6 (six) hours as needed for wheezing or shortness of breath.   . Ascorbic Acid (VITAMIN C PO) Take 1 tablet by mouth daily.   Marland Kitchen aspirin EC 81 MG tablet Take 81 mg by mouth daily.   Marland Kitchen atenolol (TENORMIN) 50 MG tablet Take 25 mg by mouth daily.    . Calcium Carb-Cholecalciferol (CALCIUM 600+D) 600-800 MG-UNIT TABS Take 1 tablet by mouth 2 (two) times daily.   . Cyanocobalamin (VITAMIN B-12 PO) Take 1 tablet by mouth daily.   Marland Kitchen  diclofenac sodium (VOLTAREN) 1 % GEL Apply 2 g topically daily as needed (for knee pain).    . fluticasone (FLONASE) 50 MCG/ACT nasal spray Place 1 spray into both nostrils as needed for allergies.    . hydrocortisone cream 1 % Apply 1 application topically 2 (two) times daily.   Marland Kitchen. loratadine (CLARITIN) 10 MG tablet Take 10 mg by mouth daily.    . meloxicam (MOBIC) 15 MG tablet Take 1 tablet by mouth daily. 06/18/2018: Taking as needed  . omeprazole (PRILOSEC) 20 MG capsule 1 PO 30 MINS PRIOR TO BREAKFAST. (Patient taking differently: Take 20 mg by mouth daily as needed (for acid reflux/GERD). )   . Polyethyl Glycol-Propyl Glycol (SYSTANE ULTRA OP) Apply 1 drop to eye 2 (two) times daily as needed (dry eye relief).    . potassium chloride (K-DUR,KLOR-CON) 10 MEQ tablet Take 10 mEq by mouth daily.   . pravastatin (PRAVACHOL) 20 MG tablet Take 20 mg by mouth daily.   Marland Kitchen. triamterene-hydrochlorothiazide (DYAZIDE) 37.5-25 MG capsule Take 1 capsule by mouth daily.     Assessment:  Drugs sorted by system:  Cardiovascular: aspirin, atenolol, pravastatin, triamterene-hydrochlorothiazide  Pulmonary/Allergy: albuterol, Flonase, loratadine,   Gastrointestinal: omeprazole (as needed)  Topical: Voltaren gel, hydrocortisone cream  Pain: acetaminophen  Vitamins/Minerals: Vitamin C, calcium + Vitamin D, Vitamin B12, potassium  Infectious Diseases: acyclovir  Miscellaneous: lubricating eye drops   Medications to avoid in the elderly:  -meloxicam: patient counseled & patient planning to try Voltaren gel in place. To follow up with PCP if not managed by gel.  Drug interactions:  -potassium + loratadine: Anticholinergic Agents may enhance the ulcerogenic effect of Potassium Chloride. Patient counseled about risk, management of potential anticholinergic symptoms and administration of potassium to avoid interaction.  -aspirin + meloxicam/Voltaren: Nonsteroidal Anti-Inflammatory Agents may enhance the adverse/toxic effect of Salicylates. An increased risk of bleeding may be associated with use of this combination. Patient counseled about risk. Patient planning to not use meloxicam at this time.  -triamterene-hydrochlorothiazide + potassium chloride: Potassium Salts may enhance the hyperkalemic effect of Potassium-Sparing Diuretics. Note  patient's current potassium prescription from PCP filled 05/02/18 following potassium [facility] of 3.2 mmol/L [facility] on 04/29/18. Will call to follow up with PCP office about follow up lab work.   Plan:  1) Patient to call to follow up with Urgent Care regarding lab results from recent visit.  2) Patient to call [facility] for refill of pravastatin.  3) Patient to start using a weekly pillbox to aid with her medication adherence.  4) Will call to follow up with patient's PCP office to determine if a follow up potassium level has been drawn or when it will next be drawn.  5) Will plan to close pharmacy episode following coordination of care call to patient's PCP.  Duanne MoronElisabeth Hydee Fleece, PharmD, [facility] HospitalBCACP Clinical Pharmacist [facility] Care Management [phone]

## 2018-06-18 NOTE — Patient Outreach (Signed)
[facility] Care Management  06/18/2018  Jasmine Rhodes 1946-03-09 [id number]   Call patient's PCP office and confirm that patient's potassium level was re-checked on 05/19/18 and was within normal limits, 3.8  Mmol/L, at that time.  Will close pharmacy episode.  Duanne MoronElisabeth Mirenda Baltazar, PharmD, [facility]BCACP Clinical Pharmacist [facility] Care Management [phone]

## 2018-08-12 DIAGNOSIS — E785 Hyperlipidemia, unspecified: Secondary | ICD-10-CM | POA: Diagnosis not present

## 2018-08-12 DIAGNOSIS — I10 Essential (primary) hypertension: Secondary | ICD-10-CM | POA: Diagnosis not present

## 2018-08-12 DIAGNOSIS — F17210 Nicotine dependence, cigarettes, uncomplicated: Secondary | ICD-10-CM | POA: Diagnosis not present

## 2018-08-12 DIAGNOSIS — J4520 Mild intermittent asthma, uncomplicated: Secondary | ICD-10-CM | POA: Diagnosis not present

## 2018-08-22 DIAGNOSIS — E785 Hyperlipidemia, unspecified: Secondary | ICD-10-CM | POA: Diagnosis not present

## 2018-08-22 DIAGNOSIS — I10 Essential (primary) hypertension: Secondary | ICD-10-CM | POA: Diagnosis not present

## 2018-08-22 DIAGNOSIS — E876 Hypokalemia: Secondary | ICD-10-CM | POA: Diagnosis not present

## 2018-08-22 DIAGNOSIS — E663 Overweight: Secondary | ICD-10-CM | POA: Diagnosis not present

## 2018-10-08 ENCOUNTER — Emergency Department (HOSPITAL_COMMUNITY): Payer: PPO

## 2018-10-08 ENCOUNTER — Encounter (HOSPITAL_COMMUNITY): Payer: Self-pay | Admitting: Emergency Medicine

## 2018-10-08 ENCOUNTER — Emergency Department (HOSPITAL_COMMUNITY)
Admission: EM | Admit: 2018-10-08 | Discharge: 2018-10-08 | Disposition: A | Discharge status: Home / Self Care | Payer: PPO | Attending: Emergency Medicine | Admitting: Emergency Medicine

## 2018-10-08 ENCOUNTER — Other Ambulatory Visit: Payer: Self-pay

## 2018-10-08 DIAGNOSIS — J22 Unspecified acute lower respiratory infection: Secondary | ICD-10-CM | POA: Diagnosis not present

## 2018-10-08 DIAGNOSIS — Z7982 Long term (current) use of aspirin: Secondary | ICD-10-CM | POA: Insufficient documentation

## 2018-10-08 DIAGNOSIS — F17210 Nicotine dependence, cigarettes, uncomplicated: Secondary | ICD-10-CM | POA: Insufficient documentation

## 2018-10-08 DIAGNOSIS — R69 Illness, unspecified: Secondary | ICD-10-CM

## 2018-10-08 DIAGNOSIS — I10 Essential (primary) hypertension: Secondary | ICD-10-CM | POA: Diagnosis not present

## 2018-10-08 DIAGNOSIS — J111 Influenza due to unidentified influenza virus with other respiratory manifestations: Secondary | ICD-10-CM | POA: Insufficient documentation

## 2018-10-08 DIAGNOSIS — Z79899 Other long term (current) drug therapy: Secondary | ICD-10-CM | POA: Insufficient documentation

## 2018-10-08 DIAGNOSIS — J45909 Unspecified asthma, uncomplicated: Secondary | ICD-10-CM | POA: Insufficient documentation

## 2018-10-08 DIAGNOSIS — R05 Cough: Secondary | ICD-10-CM | POA: Diagnosis not present

## 2018-10-08 LAB — INFLUENZA PANEL BY PCR (TYPE A & B)
Influenza A By PCR: NEGATIVE
Influenza B By PCR: NEGATIVE

## 2018-10-08 MED ORDER — CHLORPHENIRAMINE-DM 4-30 MG PO TABS: ORAL_TABLET | ORAL | 0 refills | Status: DC

## 2018-10-08 MED ORDER — HYDROCODONE-HOMATROPINE 5-1.5 MG/5ML PO SYRP: 5.0000 mL | ORAL_SOLUTION | Freq: Four times a day (QID) | ORAL | 0 refills | Status: DC | PRN

## 2018-10-08 NOTE — Discharge Instructions (Addendum)
Your influenza test is negative.  Your chest x-ray is negative for pneumonia, or other acute lung related problems.  Your examination favors an upper respiratory infection.  Please increase fluids.  Please wash hands frequently.  Use your mask until symptoms have resolved.  Please use coricidin HBP every 6 hours.  Use Hycodan for cough not improved by the Coricidin.  Use Tylenol every 4 hours for fever or aching..  Please refrain from aspirin and ibuprofen other than aspirin for your heart.  Please return to the emergency department or see your primary physician if any changes in your condition, problems, or concerns.

## 2018-10-08 NOTE — ED Triage Notes (Signed)
Pt c/o cough, chest congestion, chills, itchy eyes/ears/throat, nasal drainage x 3-4 days

## 2018-10-08 NOTE — ED Provider Notes (Signed)
[facility] EMERGENCY DEPARTMENT Provider Note   CSN: [id number] Arrival date & time: 10/08/18  1300    History   Chief Complaint Chief Complaint  Patient presents with  . Influenza    HPI Jasmine Rhodes is a 73 y.o. female.     Patient is a 73 year old female who presents to the emergency department with a complaint of cough, chest congestion, chills, scratchy throat and nasal drainage.  The patient states this problem started 3 to 4 days ago.  She says she has had some sweats, but her thermometer at home is not measuring any elevation in her temperature.  She thinks however that she has had some subjective fever.  She has been taking Sudafed, Tylenol, and aspirin, but the symptoms are slow to resolve.  She went to see her physician and they sent her to the emergency department for additional testing.  She says she feels actually a little bit better today than she has felt over the last few days.  She is not been on any commercial transportation recently.  She has not been out of the country recently, and she has not been to any of the hotspots for coronavirus here in [place].   Influenza  Presenting symptoms: cough and fever   Presenting symptoms: no diarrhea, no nausea, no shortness of breath and no vomiting   Associated symptoms: chills and nasal congestion     Past Medical History:  Diagnosis Date  . Asthma   . DDD (degenerative disc disease), lumbar   . Hyperlipidemia   . Hypertension   . Osteoarthritis     Patient Active Problem List   Diagnosis Date Noted  . Gastritis and gastroduodenitis   . History of colonic polyps 03/11/2017  . Chronic RLQ pain 03/11/2017  . Abdominal pain, epigastric 03/11/2017  . Esophageal dysphagia 03/11/2017  . HTN (hypertension) 01/11/2017  . Overweight 01/11/2017  . Hyperlipidemia 01/11/2017  . Allergic rhinitis 01/11/2017  . Mild intermittent asthma 01/11/2017  . Osteoarthritis of both knees 01/11/2017  . Carpal tunnel  syndrome, right 01/11/2017  . Chest pain 01/11/2017  . Stress 01/11/2017  . Lumbago with sciatica, right side 01/11/2017    Past Surgical History:  Procedure Laterality Date  . CARPAL TUNNEL RELEASE Right 03/30/2017  . COLONOSCOPY    . COLONOSCOPY N/A 04/25/2017   Procedure: COLONOSCOPY;  Surgeon: West Bali, MD;  [facility];  Service: Endoscopy;  Laterality: N/A;  1200  . DG THUMB LEFT HAND    . ESOPHAGOGASTRODUODENOSCOPY N/A 04/25/2017   Procedure: ESOPHAGOGASTRODUODENOSCOPY (EGD);  Surgeon: West Bali, MD;  [facility];  Service: Endoscopy;  Laterality: N/A;  . SAVORY DILATION N/A 04/25/2017   Procedure: SAVORY DILATION;  Surgeon: West Bali, MD;  Location[facility];  Service: Endoscopy;  Laterality: N/A;  . tonsils removal       OB History   No obstetric history on file.      Home Medications    Prior to Admission medications   Medication Sig Start Date End Date Taking? Authorizing Provider  acetaminophen (TYLENOL) 325 MG tablet Take 650 mg by mouth every 6 (six) hours as needed for moderate pain.    [provider]  acyclovir (ZOVIRAX) 400 MG tablet Take 1 tablet by mouth 3 (three) times daily. 06/12/18   [provider]  albuterol (PROVENTIL) (2.5 MG/3ML) 0.083% nebulizer solution Take 2.5 mg by nebulization every 6 (six) hours as needed for wheezing or shortness of breath.  [provider]  Ascorbic Acid (VITAMIN C PO) Take 1 tablet by mouth daily.    [provider]  aspirin EC 81 MG tablet Take 81 mg by mouth daily.    [provider]  atenolol (TENORMIN) 50 MG tablet Take 25 mg by mouth daily.     [provider]  Calcium Carb-Cholecalciferol (CALCIUM 600+D) 600-800 MG-UNIT TABS Take 1 tablet by mouth 2 (two) times daily.    [provider]  Cyanocobalamin (VITAMIN B-12 PO) Take 1 tablet by mouth daily.    [provider]  diclofenac sodium (VOLTAREN) 1 %  GEL Apply 2 g topically daily as needed (for knee pain).  03/19/18   [provider]  fluticasone (FLONASE) 50 MCG/ACT nasal spray Place 1 spray into both nostrils as needed for allergies.     [provider]  hydrocortisone cream 1 % Apply 1 application topically 2 (two) times daily.    [provider]  loratadine (CLARITIN) 10 MG tablet Take 10 mg by mouth daily.    [provider]  meloxicam (MOBIC) 15 MG tablet Take 1 tablet by mouth daily. 06/12/18   [provider]  omeprazole (PRILOSEC) 20 MG capsule 1 PO 30 MINS PRIOR TO BREAKFAST. Patient taking differently: Take 20 mg by mouth daily as needed (for acid reflux/GERD).  04/25/17   Fields, Darleene Cleaver, MD  Polyethyl Glycol-Propyl Glycol (SYSTANE ULTRA OP) Apply 1 drop to eye 2 (two) times daily as needed (dry eye relief).     [provider]  potassium chloride (K-DUR,KLOR-CON) 10 MEQ tablet Take 10 mEq by mouth daily.    [provider]  pravastatin (PRAVACHOL) 20 MG tablet Take 20 mg by mouth daily.    [provider]  triamterene-hydrochlorothiazide (DYAZIDE) 37.5-25 MG capsule Take 1 capsule by mouth daily.    [provider]    Family History Family History  Problem Relation Age of Onset  . Hypertension Mother   . Colon cancer Brother 65       Half-brother related by her mother    Social History Social History   Tobacco Use  . Smoking status: Current Every Day Smoker    Packs/day: 0.25    Years: 40.00    Pack years: 10.00    Types: Cigarettes  . Smokeless tobacco: Never Used  Substance Use Topics  . Alcohol use: Yes    Comment: occasional  . Drug use: No     Allergies   Sulfa antibiotics   Review of Systems Review of Systems  Constitutional: Positive for appetite change, chills and fever. Negative for activity change.       All ROS Neg except as noted in HPI  HENT: Positive for congestion and postnasal drip. Negative for nosebleeds.    Eyes: Negative for photophobia and discharge.  Respiratory: Positive for cough. Negative for shortness of breath and wheezing.   Cardiovascular: Negative for chest pain and palpitations.  Gastrointestinal: Negative for abdominal pain, blood in stool, diarrhea, nausea and vomiting.  Genitourinary: Negative for dysuria, frequency and hematuria.  Musculoskeletal: Negative for arthralgias, back pain and neck pain.  Skin: Negative.   Neurological: Negative for dizziness, seizures and speech difficulty.  Psychiatric/Behavioral: Negative for confusion and hallucinations.     Physical Exam Updated Vital Signs BP 135/81 (BP Location: Right Arm)   Pulse 66   Temp 98.2 F (36.8 C) (Oral)   Resp 18   Ht  (1.651 m)   Wt 68 kg  SpO2 100%   BMI 24.96 kg/m   Physical Exam Vitals signs and nursing note reviewed.  Constitutional:      Appearance: She is well-developed. She is not toxic-appearing.  HENT:     Head: Normocephalic.     Right Ear: Tympanic membrane and external ear normal.     Left Ear: Tympanic membrane and external ear normal.     Nose: Congestion present.  Eyes:     General: Lids are normal.     Pupils: Pupils are equal, round, and reactive to light.  Neck:     Musculoskeletal: Normal range of motion and neck supple. No neck rigidity.  Cardiovascular:     Rate and Rhythm: Normal rate and regular rhythm.     Pulses: Normal pulses.     Heart sounds: Normal heart sounds.  Pulmonary:     Effort: No respiratory distress.     Breath sounds: Rhonchi present.  Abdominal:     General: Bowel sounds are normal.     Palpations: Abdomen is soft.     Tenderness: There is no abdominal tenderness. There is no guarding.  Musculoskeletal: Normal range of motion.  Lymphadenopathy:     Head:     Right side of head: No submandibular adenopathy.     Left side of head: No submandibular adenopathy.     Cervical: No cervical adenopathy.  Skin:    General: Skin is warm and dry.   Neurological:     Mental Status: She is alert and oriented to person, place, and time.     Cranial Nerves: No cranial nerve deficit.     Sensory: No sensory deficit.  Psychiatric:        Speech: Speech normal.      ED Treatments / Results  Labs (all labs ordered are listed, but only abnormal results are displayed) Labs Reviewed  INFLUENZA PANEL BY PCR (TYPE A & B)    EKG None  Radiology No results found.  Procedures Procedures (including critical care time)  Medications Ordered in ED Medications - No data to display   Initial Impression / Assessment and Plan / ED Course  I have reviewed the triage vital signs and the nursing notes.  Pertinent labs & imaging results that were available during my care of the patient were reviewed by me and considered in my medical decision making (see chart for details).          Final Clinical Impressions(s) / ED Diagnoses MDM  Vital signs reviewed.  Pulse oximetry is 100% on room air.  Within normal limits by my interpretation.  The patient speaks in complete sentences without problem.  She is ambulatory without problem.  There is symmetrical rise and fall of the chest.  Influenza test is negative.  The patient is feeling better today than she was on previous days.  We will check a chest x-ray as the patient had coarse breath sounds and rhonchi on examination.  Chest x-ray shows mild peribronchial thickening.  Otherwise the lungs are clear.  Patient is ambulatory without problem.  The examination favors upper respiratory infection/influenza type illness.  The patient is asked to wash hands frequently, and to increase fluids.  Patient is given medication for symptoms including medication for cough.  I have asked the patient to use Tylenol extra strength instead of the aspirin.  Coricidin HBP for congestion.  Patient is to follow-up with the primary physician for recheck.  Patient will return to the emergency department for  additional evaluation  if not improving.     Final diagnoses:  Influenza-like illness    ED Discharge Orders         Ordered    HYDROcodone-homatropine (HYCODAN) 5-1.5 MG/5ML syrup  Every 6 hours PRN     10/08/18 1743    Chlorpheniramine-DM (CORICIDIN COUGH/COLD) 4-30 MG TABS     10/08/18 1743           Ivery Quale, PA-C 10/10/18 0042    Samuel Jester, DO 10/10/18 2315

## 2018-10-13 ENCOUNTER — Other Ambulatory Visit: Payer: Self-pay | Admitting: Family Medicine

## 2018-10-13 DIAGNOSIS — Z1231 Encounter for screening mammogram for malignant neoplasm of breast: Secondary | ICD-10-CM

## 2018-10-22 ENCOUNTER — Ambulatory Visit: Payer: PPO

## 2018-11-19 ENCOUNTER — Ambulatory Visit: Payer: PPO

## 2018-12-16 DIAGNOSIS — F1020 Alcohol dependence, uncomplicated: Secondary | ICD-10-CM | POA: Diagnosis not present

## 2018-12-16 DIAGNOSIS — I10 Essential (primary) hypertension: Secondary | ICD-10-CM | POA: Diagnosis not present

## 2018-12-16 DIAGNOSIS — E785 Hyperlipidemia, unspecified: Secondary | ICD-10-CM | POA: Diagnosis not present

## 2018-12-16 DIAGNOSIS — J4520 Mild intermittent asthma, uncomplicated: Secondary | ICD-10-CM | POA: Diagnosis not present

## 2019-01-06 ENCOUNTER — Encounter: Payer: Self-pay | Admitting: Gastroenterology

## 2019-01-12 ENCOUNTER — Ambulatory Visit: Payer: PPO

## 2019-01-13 ENCOUNTER — Ambulatory Visit
Admission: RE | Admit: 2019-01-13 | Discharge: 2019-01-13 | Disposition: A | Discharge status: Home / Self Care | Payer: PPO | Source: Ambulatory Visit | Attending: Family Medicine | Admitting: Family Medicine

## 2019-01-13 ENCOUNTER — Other Ambulatory Visit: Payer: Self-pay

## 2019-01-13 DIAGNOSIS — Z1231 Encounter for screening mammogram for malignant neoplasm of breast: Secondary | ICD-10-CM | POA: Diagnosis not present

## 2019-02-09 ENCOUNTER — Other Ambulatory Visit: Payer: Self-pay

## 2019-02-09 DIAGNOSIS — Z20822 Contact with and (suspected) exposure to covid-19: Secondary | ICD-10-CM

## 2019-02-13 LAB — NOVEL CORONAVIRUS, NAA: SARS-CoV-2, NAA: NOT DETECTED

## 2019-08-19 DIAGNOSIS — H16223 Keratoconjunctivitis sicca, not specified as Sjogren's, bilateral: Secondary | ICD-10-CM | POA: Diagnosis not present

## 2019-11-12 DIAGNOSIS — I10 Essential (primary) hypertension: Secondary | ICD-10-CM | POA: Diagnosis not present

## 2019-11-12 DIAGNOSIS — Z0189 Encounter for other specified special examinations: Secondary | ICD-10-CM | POA: Diagnosis not present

## 2019-11-12 DIAGNOSIS — E782 Mixed hyperlipidemia: Secondary | ICD-10-CM | POA: Diagnosis not present

## 2019-11-26 DIAGNOSIS — J302 Other seasonal allergic rhinitis: Secondary | ICD-10-CM | POA: Diagnosis not present

## 2019-11-26 DIAGNOSIS — E782 Mixed hyperlipidemia: Secondary | ICD-10-CM | POA: Diagnosis not present

## 2019-11-26 DIAGNOSIS — I10 Essential (primary) hypertension: Secondary | ICD-10-CM | POA: Diagnosis not present

## 2019-11-26 DIAGNOSIS — L989 Disorder of the skin and subcutaneous tissue, unspecified: Secondary | ICD-10-CM | POA: Diagnosis not present

## 2019-11-26 DIAGNOSIS — Y92538 Other ambulatory health services establishments as the place of occurrence of the external cause: Secondary | ICD-10-CM | POA: Diagnosis not present

## 2019-11-26 DIAGNOSIS — Z0189 Encounter for other specified special examinations: Secondary | ICD-10-CM | POA: Diagnosis not present

## 2019-11-26 DIAGNOSIS — R7301 Impaired fasting glucose: Secondary | ICD-10-CM | POA: Diagnosis not present

## 2019-11-26 DIAGNOSIS — E785 Hyperlipidemia, unspecified: Secondary | ICD-10-CM | POA: Diagnosis not present

## 2019-12-10 ENCOUNTER — Other Ambulatory Visit: Payer: Self-pay | Admitting: Internal Medicine

## 2019-12-10 DIAGNOSIS — L570 Actinic keratosis: Secondary | ICD-10-CM | POA: Diagnosis not present

## 2019-12-10 DIAGNOSIS — Z1231 Encounter for screening mammogram for malignant neoplasm of breast: Secondary | ICD-10-CM

## 2019-12-10 DIAGNOSIS — X32XXXA Exposure to sunlight, initial encounter: Secondary | ICD-10-CM | POA: Diagnosis not present

## 2020-01-14 ENCOUNTER — Ambulatory Visit
Admission: RE | Admit: 2020-01-14 | Discharge: 2020-01-14 | Disposition: A | Discharge status: Home / Self Care | Payer: Medicare Other | Source: Ambulatory Visit | Attending: Internal Medicine | Admitting: Internal Medicine

## 2020-01-14 ENCOUNTER — Other Ambulatory Visit: Payer: Self-pay

## 2020-01-14 DIAGNOSIS — Z1231 Encounter for screening mammogram for malignant neoplasm of breast: Secondary | ICD-10-CM

## 2020-03-07 DIAGNOSIS — E782 Mixed hyperlipidemia: Secondary | ICD-10-CM | POA: Diagnosis not present

## 2020-03-07 DIAGNOSIS — L989 Disorder of the skin and subcutaneous tissue, unspecified: Secondary | ICD-10-CM | POA: Diagnosis not present

## 2020-03-07 DIAGNOSIS — I10 Essential (primary) hypertension: Secondary | ICD-10-CM | POA: Diagnosis not present

## 2020-03-07 DIAGNOSIS — J302 Other seasonal allergic rhinitis: Secondary | ICD-10-CM | POA: Diagnosis not present

## 2020-03-07 DIAGNOSIS — Y92538 Other ambulatory health services establishments as the place of occurrence of the external cause: Secondary | ICD-10-CM | POA: Diagnosis not present

## 2020-03-10 DIAGNOSIS — Z950 Presence of cardiac pacemaker: Secondary | ICD-10-CM | POA: Diagnosis not present

## 2020-03-10 DIAGNOSIS — I10 Essential (primary) hypertension: Secondary | ICD-10-CM | POA: Diagnosis not present

## 2020-03-10 DIAGNOSIS — M545 Low back pain: Secondary | ICD-10-CM | POA: Diagnosis not present

## 2020-03-10 DIAGNOSIS — E782 Mixed hyperlipidemia: Secondary | ICD-10-CM | POA: Diagnosis not present

## 2020-03-10 DIAGNOSIS — J302 Other seasonal allergic rhinitis: Secondary | ICD-10-CM | POA: Diagnosis not present

## 2020-03-16 ENCOUNTER — Encounter: Payer: Self-pay | Admitting: Internal Medicine

## 2020-03-17 ENCOUNTER — Other Ambulatory Visit (HOSPITAL_COMMUNITY): Payer: Self-pay | Admitting: Internal Medicine

## 2020-03-17 ENCOUNTER — Other Ambulatory Visit: Payer: Self-pay | Admitting: Internal Medicine

## 2020-03-17 DIAGNOSIS — F17210 Nicotine dependence, cigarettes, uncomplicated: Secondary | ICD-10-CM

## 2020-04-07 ENCOUNTER — Ambulatory Visit (HOSPITAL_COMMUNITY)
Admission: RE | Admit: 2020-04-07 | Discharge: 2020-04-07 | Disposition: A | Discharge status: Home / Self Care | Payer: Medicare Other | Source: Ambulatory Visit | Attending: Internal Medicine | Admitting: Internal Medicine

## 2020-04-07 ENCOUNTER — Other Ambulatory Visit: Payer: Self-pay

## 2020-04-07 ENCOUNTER — Encounter (HOSPITAL_COMMUNITY): Payer: Self-pay

## 2020-04-07 DIAGNOSIS — F17210 Nicotine dependence, cigarettes, uncomplicated: Secondary | ICD-10-CM

## 2020-04-20 ENCOUNTER — Encounter: Payer: Self-pay | Admitting: Internal Medicine

## 2020-04-20 ENCOUNTER — Telehealth: Payer: Self-pay | Admitting: *Deleted

## 2020-04-20 ENCOUNTER — Ambulatory Visit (INDEPENDENT_AMBULATORY_CARE_PROVIDER_SITE_OTHER): Payer: Medicare Other | Admitting: Internal Medicine

## 2020-04-20 ENCOUNTER — Other Ambulatory Visit: Payer: Self-pay

## 2020-04-20 VITALS — BP 105/64 | HR 72 | Temp 97.0°F | Ht 65.5 in | Wt 149.8 lb

## 2020-04-20 DIAGNOSIS — R140 Abdominal distension (gaseous): Secondary | ICD-10-CM | POA: Diagnosis not present

## 2020-04-20 DIAGNOSIS — K219 Gastro-esophageal reflux disease without esophagitis: Secondary | ICD-10-CM | POA: Insufficient documentation

## 2020-04-20 DIAGNOSIS — R1013 Epigastric pain: Secondary | ICD-10-CM | POA: Diagnosis not present

## 2020-04-20 MED ORDER — OMEPRAZOLE 40 MG PO CPDR: 40.0000 mg | DELAYED_RELEASE_CAPSULE | Freq: Every day | ORAL | 3 refills | Status: DC

## 2020-04-20 NOTE — Progress Notes (Addendum)
Referring Provider: Gareth Morgan, MD Primary Care Physician:  Benita Stabile, MD Primary GI:  Dr. Marletta Lor  Chief Complaint  Patient presents with  . Gastroesophageal Reflux    bloating and gas    HPI:   Jasmine Rhodes is a 74 y.o. female who presents to clinic today for follow-up visit.  Last seen 2 years ago.  She underwent EGD in 2018 for dysphagia and reflux.  She was found to have a Schatzki's ring which was dilated with 17 mm.  She was placed on omeprazole at that time and states her symptoms were well controlled for some time.  She has not taken omeprazole in a while as her prescription ran out.  She notes reflux and heartburn.  Occasional esophageal dysphagia with liquids.  Also has bloating and epigastric pain.  No melena hematochezia.  No history of PUD or H. pylori.  Otherwise she has no other complaints.  Past Medical History:  Diagnosis Date  . Asthma   . DDD (degenerative disc disease), lumbar   . Hyperlipidemia   . Hypertension   . Osteoarthritis     Past Surgical History:  Procedure Laterality Date  . CARPAL TUNNEL RELEASE Right 03/30/2017  . COLONOSCOPY    . COLONOSCOPY N/A 04/25/2017   Procedure: COLONOSCOPY;  Surgeon: West Bali, MD;  [facility];  Service: Endoscopy;  Laterality: N/A;  1200  . DG THUMB LEFT HAND    . ESOPHAGOGASTRODUODENOSCOPY N/A 04/25/2017   Procedure: ESOPHAGOGASTRODUODENOSCOPY (EGD);  Surgeon: West Bali, MD;  [facility];  Service: Endoscopy;  Laterality: N/A;  . SAVORY DILATION N/A 04/25/2017   Procedure: SAVORY DILATION;  Surgeon: West Bali, MD;  [facility];  Service: Endoscopy;  Laterality: N/A;  . tonsils removal      Current Outpatient Medications  Medication Sig Dispense Refill  . acetaminophen (TYLENOL) 325 MG tablet Take 650 mg by mouth as needed for moderate pain.     Marland Kitchen albuterol (PROVENTIL) (2.5 MG/3ML) 0.083% nebulizer solution Take 2.5 mg by nebulization every 6  (six) hours as needed for wheezing or shortness of breath.    . Ascorbic Acid (VITAMIN C PO) Take 1 tablet by mouth daily.    Marland Kitchen aspirin EC 81 MG tablet Take 81 mg by mouth daily.    Marland Kitchen atenolol (TENORMIN) 50 MG tablet Take 50 mg by mouth daily.     . Calcium Carb-Cholecalciferol (CALCIUM 600+D) 600-800 MG-UNIT TABS Take 1 tablet by mouth 2 (two) times daily.    . cetirizine (ZYRTEC) 10 MG tablet Take 10 mg by mouth daily.    . Cyanocobalamin (VITAMIN B-12 PO) Take 1 tablet by mouth daily.    . diclofenac sodium (VOLTAREN) 1 % GEL Apply 2 g topically daily as needed (for knee pain).   3  . fluticasone (FLONASE) 50 MCG/ACT nasal spray Place 1 spray into both nostrils as needed for allergies.     Bertram Gala Glycol-Propyl Glycol (SYSTANE ULTRA OP) Apply 1 drop to eye 2 (two) times daily as needed (dry eye relief).     . potassium chloride (K-DUR,KLOR-CON) 10 MEQ tablet Take 10 mEq by mouth daily.    . pravastatin (PRAVACHOL) 20 MG tablet Take 20 mg by mouth daily.    Marland Kitchen triamterene-hydrochlorothiazide (DYAZIDE) 37.5-25 MG capsule Take 1 capsule by mouth daily.    Marland Kitchen acyclovir (ZOVIRAX) 400 MG tablet Take 1 tablet by mouth 3 (three) times daily. (Patient not taking: Reported on  04/20/2020)  0  . Chlorpheniramine-DM (CORICIDIN COUGH/COLD) 4-30 MG TABS 2 tabs q6h for congestion (Patient not taking: Reported on 04/20/2020) 40 each 0  . HYDROcodone-homatropine (HYCODAN) 5-1.5 MG/5ML syrup Take 5 mLs by mouth every 6 (six) hours as needed. (Patient not taking: Reported on 04/20/2020) 120 mL 0  . hydrocortisone cream 1 % Apply 1 application topically 2 (two) times daily. (Patient not taking: Reported on 04/20/2020)    . loratadine (CLARITIN) 10 MG tablet Take 10 mg by mouth daily. (Patient not taking: Reported on 04/20/2020)    . meloxicam (MOBIC) 15 MG tablet Take 1 tablet by mouth daily. (Patient not taking: Reported on 04/20/2020)  11  . omeprazole (PRILOSEC) 20 MG capsule 1 PO 30 MINS PRIOR TO BREAKFAST.  (Patient not taking: Reported on 04/20/2020) 90 capsule 3   No current facility-administered medications for this visit.    Allergies as of 04/20/2020 - Review Complete 04/20/2020  Allergen Reaction Noted  . Sulfa antibiotics Rash 01/11/2017    Family History  Problem Relation Age of Onset  . Hypertension Mother   . Colon cancer Brother 88       Half-brother related by her mother    Social History   Socioeconomic History  . Marital status: Divorced    Spouse name: Not on file  . Number of children: Not on file  . Years of education: Not on file  . Highest education level: Not on file  Occupational History  . Not on file  Tobacco Use  . Smoking status: Current Every Day Smoker    Packs/day: 0.25    Years: 40.00    Pack years: 10.00    Types: Cigarettes  . Smokeless tobacco: Never Used  Vaping Use  . Vaping Use: Never used  Substance and Sexual Activity  . Alcohol use: Yes    Comment: occasional  . Drug use: No  . Sexual activity: Not on file  Other Topics Concern  . Not on file  Social History Narrative  . Not on file   Social Determinants of Health   Financial Resource Strain:   . Difficulty of Paying Living Expenses: Not on file  Food Insecurity:   . Worried About Programme researcher, broadcasting/film/video in the Last Year: Not on file  . Ran Out of Food in the Last Year: Not on file  Transportation Needs:   . Lack of Transportation (Medical): Not on file  . Lack of Transportation (Non-Medical): Not on file  Physical Activity:   . Days of Exercise per Week: Not on file  . Minutes of Exercise per Session: Not on file  Stress:   . Feeling of Stress : Not on file  Social Connections:   . Frequency of Communication with Friends and Family: Not on file  . Frequency of Social Gatherings with Friends and Family: Not on file  . Attends Religious Services: Not on file  . Active Member of Clubs or Organizations: Not on file  . Attends Banker Meetings: Not on file  .  Marital Status: Not on file    Subjective: Review of Systems  Constitutional: Negative for chills and fever.  HENT: Negative for congestion and hearing loss.   Eyes: Negative for blurred vision and double vision.  Respiratory: Negative for cough and shortness of breath.   Cardiovascular: Negative for chest pain and palpitations.  Gastrointestinal: Positive for abdominal pain and heartburn. Negative for blood in stool, constipation, diarrhea, melena and vomiting.  Bloating, dysphagia   Genitourinary: Negative for dysuria and urgency.  Musculoskeletal: Negative for joint pain and myalgias.  Skin: Negative for itching and rash.  Neurological: Negative for dizziness and headaches.  Psychiatric/Behavioral: Negative for depression. The patient is not nervous/anxious.      Objective: BP 105/64   Pulse 72   Temp (!) 97 F (36.1 C) (Oral)   Ht 5' 5.5" (1.664 m)   Wt 149 lb 12.8 oz (67.9 kg)   BMI 24.55 kg/m  Physical Exam Constitutional:      Appearance: Normal appearance.  HENT:     Head: Normocephalic and atraumatic.  Eyes:     Extraocular Movements: Extraocular movements intact.     Conjunctiva/sclera: Conjunctivae normal.  Cardiovascular:     Rate and Rhythm: Normal rate and regular rhythm.  Pulmonary:     Effort: Pulmonary effort is normal.     Breath sounds: Normal breath sounds.  Abdominal:     General: Bowel sounds are normal.     Palpations: Abdomen is soft.  Musculoskeletal:        General: No swelling. Normal range of motion.     Cervical back: Normal range of motion and neck supple.  Skin:    General: Skin is warm and dry.     Coloration: Skin is not jaundiced.  Neurological:     General: No focal deficit present.     Mental Status: She is alert and oriented to person, place, and time.  Psychiatric:        Mood and Affect: Mood normal.        Behavior: Behavior normal.      Assessment: *Chronic reflux-uncontrolled *Dysphagia *Epigastric  pain *Bloating  Plan: -Will schedule for EGD to evaluate for peptic ulcer disease, esophagitis, gastritis, H. Pylori, duodenitis, or other. Will also evaluate for esophageal stricture, Schatzki's ring, esophageal web or other.   The risks including infection, bleed, or perforation as well as benefits, limitations, alternatives and imponderables have been reviewed with the patient. Potential for esophageal dilation, biopsy, etc. have also been reviewed.  Questions have been answered. All parties agreeable.  Will restart on omeprazole 40 mg daily.  Prescription sent to pharmacy.  Reflux instructions given to patient.   Further recommendations to follow.  04/20/2020 10:52 AM   Disclaimer: This note was dictated with voice recognition software. Similar sounding words can inadvertently be transcribed and may not be corrected upon review.

## 2020-04-20 NOTE — Telephone Encounter (Signed)
LMOVM to schedule EGD/DIL with Dr. Carver, ASA 3 

## 2020-04-20 NOTE — Telephone Encounter (Signed)
Pt returned call. Scheduled for 10/12 at 2:00pm. Aware will mail prep instructions with pre-op/covid test appt.

## 2020-04-20 NOTE — Patient Instructions (Addendum)
We will schedule EGD with possible dilation in office today to further evaluate your symptoms.  I have sent a prescription to your pharmacy for omeprazole 40 mg daily to take 30 minutes before breakfast.  Lifestyle and home remedies TO MANAGE REFLUX/HEARTBURN    You may eliminate or reduce the frequency of heartburn by making the following lifestyle changes:    Control your weight. Being overweight is a major risk factor for heartburn and GERD. Excess pounds put pressure on your abdomen, pushing up your stomach and causing acid to back up into your esophagus.     Eat smaller meals. 4 TO 6 MEALS A DAY. This reduces pressure on the lower esophageal sphincter, helping to prevent the valve from opening and acid from washing back into your esophagus.      Loosen your belt. Clothes that fit tightly around your waist put pressure on your abdomen and the lower esophageal sphincter.      Eliminate heartburn triggers. Everyone has specific triggers. Common triggers such as fatty or fried foods, spicy food, tomato sauce, carbonated beverages, alcohol, chocolate, mint, garlic, onion, caffeine and nicotine may make heartburn worse.     Avoid stooping or bending. Tying your shoes is OK. Bending over for longer periods to weed your garden isn't, especially soon after eating.     Don't lie down after a meal. Wait at least three to four hours after eating before going to bed, and don't lie down right after eating.   At [facility] we value your feedback. You may receive a survey about your visit today. Please share your experience as we strive to create trusting relationships with our patients to provide genuine, compassionate, quality care.  We appreciate your understanding and patience as we review any laboratory studies, imaging, and other diagnostic tests that are ordered as we care for you. Our office policy is 5 business days for review of these results, and any emergent or urgent  results are addressed in a timely manner for your best interest. If you do not hear from our office in 1 week, please contact us.   We also encourage the use of MyChart, which contains your medical information for your review as well. If you are not enrolled in this feature, an access code is on this after visit summary for your convenience. Thank you for allowing [place] to be involved in your care.  It was great to see you today!  I hope you have a great fall!!    Roxana Lai K. Marletta Lor, D.O. Gastroenterology and Hepatology [facility]

## 2020-04-20 NOTE — H&P (View-Only) (Signed)
Referring Provider: Gareth Morgan, MD Primary Care Physician:  Benita Stabile, MD Primary GI:  Dr. Marletta Lor  Chief Complaint  Patient presents with  . Gastroesophageal Reflux    bloating and gas    HPI:   Jasmine Rhodes is a 74 y.o. female who presents to clinic today for follow-up visit.  Last seen 2 years ago.  She underwent EGD in 2018 for dysphagia and reflux.  She was found to have a Schatzki's ring which was dilated with 17 mm.  She was placed on omeprazole at that time and states her symptoms were well controlled for some time.  She has not taken omeprazole in a while as her prescription ran out.  She notes reflux and heartburn.  Occasional esophageal dysphagia with liquids.  Also has bloating and epigastric pain.  No melena hematochezia.  No history of PUD or H. pylori.  Otherwise she has no other complaints.  Past Medical History:  Diagnosis Date  . Asthma   . DDD (degenerative disc disease), lumbar   . Hyperlipidemia   . Hypertension   . Osteoarthritis     Past Surgical History:  Procedure Laterality Date  . CARPAL TUNNEL RELEASE Right 03/30/2017  . COLONOSCOPY    . COLONOSCOPY N/A 04/25/2017   Procedure: COLONOSCOPY;  Surgeon: West Bali, MD;  Location: [facility];  Service: Endoscopy;  Laterality: N/A;  1200  . DG THUMB LEFT HAND    . ESOPHAGOGASTRODUODENOSCOPY N/A 04/25/2017   Procedure: ESOPHAGOGASTRODUODENOSCOPY (EGD);  Surgeon: West Bali, MD;  Location: [facility];  Service: Endoscopy;  Laterality: N/A;  . SAVORY DILATION N/A 04/25/2017   Procedure: SAVORY DILATION;  Surgeon: West Bali, MD;  Location: [facility];  Service: Endoscopy;  Laterality: N/A;  . tonsils removal      Current Outpatient Medications  Medication Sig Dispense Refill  . acetaminophen (TYLENOL) 325 MG tablet Take 650 mg by mouth as needed for moderate pain.     Marland Kitchen albuterol (PROVENTIL) (2.5 MG/3ML) 0.083% nebulizer solution Take 2.5 mg by nebulization every 6  (six) hours as needed for wheezing or shortness of breath.    . Ascorbic Acid (VITAMIN C PO) Take 1 tablet by mouth daily.    Marland Kitchen aspirin EC 81 MG tablet Take 81 mg by mouth daily.    Marland Kitchen atenolol (TENORMIN) 50 MG tablet Take 50 mg by mouth daily.     . Calcium Carb-Cholecalciferol (CALCIUM 600+D) 600-800 MG-UNIT TABS Take 1 tablet by mouth 2 (two) times daily.    . cetirizine (ZYRTEC) 10 MG tablet Take 10 mg by mouth daily.    . Cyanocobalamin (VITAMIN B-12 PO) Take 1 tablet by mouth daily.    . diclofenac sodium (VOLTAREN) 1 % GEL Apply 2 g topically daily as needed (for knee pain).   3  . fluticasone (FLONASE) 50 MCG/ACT nasal spray Place 1 spray into both nostrils as needed for allergies.     Bertram Gala Glycol-Propyl Glycol (SYSTANE ULTRA OP) Apply 1 drop to eye 2 (two) times daily as needed (dry eye relief).     . potassium chloride (K-DUR,KLOR-CON) 10 MEQ tablet Take 10 mEq by mouth daily.    . pravastatin (PRAVACHOL) 20 MG tablet Take 20 mg by mouth daily.    Marland Kitchen triamterene-hydrochlorothiazide (DYAZIDE) 37.5-25 MG capsule Take 1 capsule by mouth daily.    Marland Kitchen acyclovir (ZOVIRAX) 400 MG tablet Take 1 tablet by mouth 3 (three) times daily. (Patient not taking: Reported on  04/20/2020)  0  . Chlorpheniramine-DM (CORICIDIN COUGH/COLD) 4-30 MG TABS 2 tabs q6h for congestion (Patient not taking: Reported on 04/20/2020) 40 each 0  . HYDROcodone-homatropine (HYCODAN) 5-1.5 MG/5ML syrup Take 5 mLs by mouth every 6 (six) hours as needed. (Patient not taking: Reported on 04/20/2020) 120 mL 0  . hydrocortisone cream 1 % Apply 1 application topically 2 (two) times daily. (Patient not taking: Reported on 04/20/2020)    . loratadine (CLARITIN) 10 MG tablet Take 10 mg by mouth daily. (Patient not taking: Reported on 04/20/2020)    . meloxicam (MOBIC) 15 MG tablet Take 1 tablet by mouth daily. (Patient not taking: Reported on 04/20/2020)  11  . omeprazole (PRILOSEC) 20 MG capsule 1 PO 30 MINS PRIOR TO BREAKFAST.  (Patient not taking: Reported on 04/20/2020) 90 capsule 3   No current facility-administered medications for this visit.    Allergies as of 04/20/2020 - Review Complete 04/20/2020  Allergen Reaction Noted  . Sulfa antibiotics Rash 01/11/2017    Family History  Problem Relation Age of Onset  . Hypertension Mother   . Colon cancer Brother 88       Half-brother related by her mother    Social History   Socioeconomic History  . Marital status: Divorced    Spouse name: Not on file  . Number of children: Not on file  . Years of education: Not on file  . Highest education level: Not on file  Occupational History  . Not on file  Tobacco Use  . Smoking status: Current Every Day Smoker    Packs/day: 0.25    Years: 40.00    Pack years: 10.00    Types: Cigarettes  . Smokeless tobacco: Never Used  Vaping Use  . Vaping Use: Never used  Substance and Sexual Activity  . Alcohol use: Yes    Comment: occasional  . Drug use: No  . Sexual activity: Not on file  Other Topics Concern  . Not on file  Social History Narrative  . Not on file   Social Determinants of Health   Financial Resource Strain:   . Difficulty of Paying Living Expenses: Not on file  Food Insecurity:   . Worried About Programme researcher, broadcasting/film/video in the Last Year: Not on file  . Ran Out of Food in the Last Year: Not on file  Transportation Needs:   . Lack of Transportation (Medical): Not on file  . Lack of Transportation (Non-Medical): Not on file  Physical Activity:   . Days of Exercise per Week: Not on file  . Minutes of Exercise per Session: Not on file  Stress:   . Feeling of Stress : Not on file  Social Connections:   . Frequency of Communication with Friends and Family: Not on file  . Frequency of Social Gatherings with Friends and Family: Not on file  . Attends Religious Services: Not on file  . Active Member of Clubs or Organizations: Not on file  . Attends Banker Meetings: Not on file  .  Marital Status: Not on file    Subjective: Review of Systems  Constitutional: Negative for chills and fever.  HENT: Negative for congestion and hearing loss.   Eyes: Negative for blurred vision and double vision.  Respiratory: Negative for cough and shortness of breath.   Cardiovascular: Negative for chest pain and palpitations.  Gastrointestinal: Positive for abdominal pain and heartburn. Negative for blood in stool, constipation, diarrhea, melena and vomiting.  Bloating, dysphagia   Genitourinary: Negative for dysuria and urgency.  Musculoskeletal: Negative for joint pain and myalgias.  Skin: Negative for itching and rash.  Neurological: Negative for dizziness and headaches.  Psychiatric/Behavioral: Negative for depression. The patient is not nervous/anxious.      Objective: BP 105/64   Pulse 72   Temp (!) 97 F (36.1 C) (Oral)   Ht 5' 5.5" (1.664 m)   Wt 149 lb 12.8 oz (67.9 kg)   BMI 24.55 kg/m  Physical Exam Constitutional:      Appearance: Normal appearance.  HENT:     Head: Normocephalic and atraumatic.  Eyes:     Extraocular Movements: Extraocular movements intact.     Conjunctiva/sclera: Conjunctivae normal.  Cardiovascular:     Rate and Rhythm: Normal rate and regular rhythm.  Pulmonary:     Effort: Pulmonary effort is normal.     Breath sounds: Normal breath sounds.  Abdominal:     General: Bowel sounds are normal.     Palpations: Abdomen is soft.  Musculoskeletal:        General: No swelling. Normal range of motion.     Cervical back: Normal range of motion and neck supple.  Skin:    General: Skin is warm and dry.     Coloration: Skin is not jaundiced.  Neurological:     General: No focal deficit present.     Mental Status: She is alert and oriented to person, place, and time.  Psychiatric:        Mood and Affect: Mood normal.        Behavior: Behavior normal.      Assessment: *Chronic reflux-uncontrolled *Dysphagia *Epigastric  pain *Bloating  Plan: -Will schedule for EGD to evaluate for peptic ulcer disease, esophagitis, gastritis, H. Pylori, duodenitis, or other. Will also evaluate for esophageal stricture, Schatzki's ring, esophageal web or other.   The risks including infection, bleed, or perforation as well as benefits, limitations, alternatives and imponderables have been reviewed with the patient. Potential for esophageal dilation, biopsy, etc. have also been reviewed.  Questions have been answered. All parties agreeable.  Will restart on omeprazole 40 mg daily.  Prescription sent to pharmacy.  Reflux instructions given to patient.   Further recommendations to follow.  04/20/2020 10:52 AM   Disclaimer: This note was dictated with voice recognition software. Similar sounding words can inadvertently be transcribed and may not be corrected upon review.

## 2020-04-20 NOTE — Telephone Encounter (Signed)
PA approved for procedure. Auth# [id number] dates 05/10/20-07/29/20

## 2020-04-21 ENCOUNTER — Encounter: Payer: Self-pay | Admitting: *Deleted

## 2020-05-04 NOTE — Patient Instructions (Signed)
Jasmine Rhodes  05/04/2020     @PREFPERIOPPHARMACY @   Your procedure is scheduled on 05/10/2020.  Report to 07/10/2020 at  1215  P.M.  Call this number if you have problems the morning of surgery:  [phone]   Remember:  Follow the diet instructions given to you by the office.                       Take these medicines the morning of surgery with A SIP OF WATER  Atenolol, zyrtec, antabuse, omeprazole.. Use your inhaler before you come.    Do not wear jewelry, make-up or nail polish.  Do not wear lotions, powders, or perfumes. Please wear deodorant and brush your teeth.  Do not shave 48 hours prior to surgery.  Men may shave face and neck.  Do not bring valuables to the hospital.  [facility] is not responsible for any belongings or valuables.  Contacts, dentures or bridgework may not be worn into surgery.  Leave your suitcase in the car.  After surgery it may be brought to your room.  For patients admitted to the hospital, discharge time will be determined by your treatment team.  Patients discharged the day of surgery will not be allowed to drive home.   Name and phone number of your driver:   family Special instructions:  DO NOT smoke the morning of your procedure.  Please read over the following fact sheets that you were given. Anesthesia Post-op Instructions and Care and Recovery After Surgery       Upper Endoscopy, Adult, Care After This sheet gives you information about how to care for yourself after your procedure. Your health care provider may also give you more specific instructions. If you have problems or questions, contact your health care provider. What can I expect after the procedure? After the procedure, it is common to have:  A sore throat.  Mild stomach pain or discomfort.  Bloating.  Nausea. Follow these instructions at home:   Follow instructions from your health care provider about what to eat or drink after your  procedure.  Return to your normal activities as told by your health care provider. Ask your health care provider what activities are safe for you.  Take over-the-counter and prescription medicines only as told by your health care provider.  Do not drive for 24 hours if you were given a sedative during your procedure.  Keep all follow-up visits as told by your health care provider. This is important. Contact a health care provider if you have:  A sore throat that lasts longer than one day.  Trouble swallowing. Get help right away if:  You vomit blood or your vomit looks like coffee grounds.  You have: ? A fever. ? Bloody, black, or tarry stools. ? A severe sore throat or you cannot swallow. ? Difficulty breathing. ? Severe pain in your chest or abdomen. Summary  After the procedure, it is common to have a sore throat, mild stomach discomfort, bloating, and nausea.  Do not drive for 24 hours if you were given a sedative during the procedure.  Follow instructions from your health care provider about what to eat or drink after your procedure.  Return to your normal activities as told by your health care provider. This information is not intended to replace advice given to you by your health care provider. Make sure you discuss any questions you have with  your health care provider. Document Revised: 01/07/2018 Document Reviewed: 12/16/2017 Elsevier Patient Education  [place].  Esophageal Dilatation Esophageal dilatation, also called esophageal dilation, is a procedure to widen or open (dilate) a blocked or narrowed part of the esophagus. The esophagus is the part of the body that moves food and liquid from the mouth to the stomach. You may need this procedure if:  You have a buildup of scar tissue in your esophagus that makes it difficult, painful, or impossible to swallow. This can be caused by gastroesophageal reflux disease (GERD).  You have cancer of the  esophagus.  There is a problem with how food moves through your esophagus. In some cases, you may need this procedure repeated at a later time to dilate the esophagus gradually. Tell a health care provider about:  Any allergies you have.  All medicines you are taking, including vitamins, herbs, eye drops, creams, and over-the-counter medicines.  Any problems you or family members have had with anesthetic medicines.  Any blood disorders you have.  Any surgeries you have had.  Any medical conditions you have.  Any antibiotic medicines you are required to take before dental procedures.  Whether you are pregnant or may be pregnant. What are the risks? Generally, this is a safe procedure. However, problems may occur, including:  Bleeding due to a tear in the lining of the esophagus.  A hole (perforation) in the esophagus. What happens before the procedure?  Follow instructions from your health care provider about eating or drinking restrictions.  Ask your health care provider about changing or stopping your regular medicines. This is especially important if you are taking diabetes medicines or blood thinners.  Plan to have someone take you home from the hospital or clinic.  Plan to have a responsible adult care for you for at least 24 hours after you leave the hospital or clinic. This is important. What happens during the procedure?  You may be given a medicine to help you relax (sedative).  A numbing medicine may be sprayed into the back of your throat, or you may gargle the medicine.  Your health care provider may perform the dilatation using various surgical instruments, such as: ? Simple dilators. This instrument is carefully placed in the esophagus to stretch it. ? Guided wire bougies. This involves using an endoscope to insert a wire into the esophagus. A dilator is passed over this wire to enlarge the esophagus. Then the wire is removed. ? Balloon dilators. An endoscope  with a small balloon at the end is inserted into the esophagus. The balloon is inflated to stretch the esophagus and open it up. The procedure may vary among health care providers and hospitals. What happens after the procedure?  Your blood pressure, heart rate, breathing rate, and blood oxygen level will be monitored until the medicines you were given have worn off.  Your throat may feel slightly sore and numb. This will improve slowly over time.  You will not be allowed to eat or drink until your throat is no longer numb.  When you are able to drink, urinate, and sit on the edge of the bed without nausea or dizziness, you may be able to return home. Follow these instructions at home:  Take over-the-counter and prescription medicines only as told by your health care provider.  Do not drive for 24 hours if you were given a sedative during your procedure.  You should have a responsible adult with you for 24 hours after  the procedure.  Follow instructions from your health care provider about any eating or drinking restrictions.  Do not use any products that contain nicotine or tobacco, such as cigarettes and e-cigarettes. If you need help quitting, ask your health care provider.  Keep all follow-up visits as told by your health care provider. This is important. Get help right away if you:  Have a fever.  Have chest pain.  Have pain that is not relieved by medication.  Have trouble breathing.  Have trouble swallowing.  Vomit blood. Summary  Esophageal dilatation, also called esophageal dilation, is a procedure to widen or open (dilate) a blocked or narrowed part of the esophagus.  Plan to have someone take you home from the hospital or clinic.  For this procedure, a numbing medicine may be sprayed into the back of your throat, or you may gargle the medicine.  Do not drive for 24 hours if you were given a sedative during your procedure. This information is not intended to  replace advice given to you by your health care provider. Make sure you discuss any questions you have with your health care provider. Document Revised: 05/13/2019 Document Reviewed: 05/21/2017 Elsevier Patient Education  2020 [place] After These instructions provide you with information about caring for yourself after your procedure. Your health care provider may also give you more specific instructions. Your treatment has been planned according to current medical practices, but problems sometimes occur. Call your health care provider if you have any problems or questions after your procedure. What can I expect after the procedure? After your procedure, you may:  Feel sleepy for several hours.  Feel clumsy and have poor balance for several hours.  Feel forgetful about what happened after the procedure.  Have poor judgment for several hours.  Feel nauseous or vomit.  Have a sore throat if you had a breathing tube during the procedure. Follow these instructions at home: For at least 24 hours after the procedure:      Have a responsible adult stay with you. It is important to have someone help care for you until you are awake and alert.  Rest as needed.  Do not: ? Participate in activities in which you could fall or become injured. ? Drive. ? Use heavy machinery. ? Drink alcohol. ? Take sleeping pills or medicines that cause drowsiness. ? Make important decisions or sign legal documents. ? Take care of children on your own. Eating and drinking  Follow the diet that is recommended by your health care provider.  If you vomit, drink water, juice, or soup when you can drink without vomiting.  Make sure you have little or no nausea before eating solid foods. General instructions  Take over-the-counter and prescription medicines only as told by your health care provider.  If you have sleep apnea, surgery and certain medicines can increase  your risk for breathing problems. Follow instructions from your health care provider about wearing your sleep device: ? Anytime you are sleeping, including during daytime naps. ? While taking prescription pain medicines, sleeping medicines, or medicines that make you drowsy.  If you smoke, do not smoke without supervision.  Keep all follow-up visits as told by your health care provider. This is important. Contact a health care provider if:  You keep feeling nauseous or you keep vomiting.  You feel light-headed.  You develop a rash.  You have a fever. Get help right away if:  You have trouble breathing. Summary  For several hours after your procedure, you may feel sleepy and have poor judgment.  Have a responsible adult stay with you for at least 24 hours or until you are awake and alert. This information is not intended to replace advice given to you by your health care provider. Make sure you discuss any questions you have with your health care provider. Document Revised: 10/14/2017 Document Reviewed: 11/06/2015 Elsevier Patient Education  [place].

## 2020-05-09 ENCOUNTER — Encounter (HOSPITAL_COMMUNITY)
Admission: RE | Admit: 2020-05-09 | Discharge: 2020-05-09 | Disposition: A | Discharge status: Home / Self Care | Payer: Medicare Other | Source: Ambulatory Visit | Attending: Internal Medicine | Admitting: Internal Medicine

## 2020-05-09 ENCOUNTER — Other Ambulatory Visit (HOSPITAL_COMMUNITY)
Admission: RE | Admit: 2020-05-09 | Discharge: 2020-05-09 | Disposition: A | Discharge status: Home / Self Care | Payer: Medicare Other | Source: Ambulatory Visit | Attending: Internal Medicine | Admitting: Internal Medicine

## 2020-05-09 ENCOUNTER — Encounter (HOSPITAL_COMMUNITY): Payer: Self-pay

## 2020-05-09 ENCOUNTER — Other Ambulatory Visit: Payer: Self-pay

## 2020-05-09 DIAGNOSIS — Z20822 Contact with and (suspected) exposure to covid-19: Secondary | ICD-10-CM | POA: Insufficient documentation

## 2020-05-09 DIAGNOSIS — Z01818 Encounter for other preprocedural examination: Secondary | ICD-10-CM | POA: Diagnosis not present

## 2020-05-09 HISTORY — DX: Sleep apnea, unspecified: G47.30

## 2020-05-09 LAB — COMPREHENSIVE METABOLIC PANEL
ALT: 18 U/L (ref 0–44)
AST: 24 U/L (ref 15–41)
Albumin: 4 g/dL (ref 3.5–5.0)
Alkaline Phosphatase: 64 U/L (ref 38–126)
Anion gap: 9 (ref 5–15)
BUN: 15 mg/dL (ref 8–23)
CO2: 29 mmol/L (ref 22–32)
Calcium: 9.4 mg/dL (ref 8.9–10.3)
Chloride: 100 mmol/L (ref 98–111)
Creatinine, Ser: 0.67 mg/dL (ref 0.44–1.00)
GFR, Estimated: >60 mL/min (ref >60)
Glucose, Bld: 131 mg/dL — ABNORMAL HIGH (ref 70–99)
Potassium: 3.1 mmol/L — ABNORMAL LOW (ref 3.5–5.1)
Sodium: 138 mmol/L (ref 135–145)
Total Bilirubin: 1.2 mg/dL (ref 0.3–1.2)
Total Protein: 7 g/dL (ref 6.5–8.1)

## 2020-05-09 LAB — SARS CORONAVIRUS 2 (TAT 6-24 HRS): SARS Coronavirus 2: NEGATIVE

## 2020-05-10 ENCOUNTER — Ambulatory Visit (HOSPITAL_COMMUNITY): Payer: Medicare Other | Admitting: Anesthesiology

## 2020-05-10 ENCOUNTER — Encounter (HOSPITAL_COMMUNITY)
Admission: RE | Disposition: A | Discharge status: Home / Self Care | Payer: Self-pay | Source: Home / Self Care | Attending: Internal Medicine

## 2020-05-10 ENCOUNTER — Encounter (HOSPITAL_COMMUNITY): Payer: Self-pay

## 2020-05-10 ENCOUNTER — Ambulatory Visit (HOSPITAL_COMMUNITY)
Admission: RE | Admit: 2020-05-10 | Discharge: 2020-05-10 | Disposition: A | Discharge status: Home / Self Care | Payer: Medicare Other | Attending: Internal Medicine | Admitting: Internal Medicine

## 2020-05-10 ENCOUNTER — Other Ambulatory Visit: Payer: Self-pay

## 2020-05-10 DIAGNOSIS — G4730 Sleep apnea, unspecified: Secondary | ICD-10-CM | POA: Insufficient documentation

## 2020-05-10 DIAGNOSIS — I10 Essential (primary) hypertension: Secondary | ICD-10-CM | POA: Insufficient documentation

## 2020-05-10 DIAGNOSIS — F17210 Nicotine dependence, cigarettes, uncomplicated: Secondary | ICD-10-CM | POA: Insufficient documentation

## 2020-05-10 DIAGNOSIS — K2980 Duodenitis without bleeding: Secondary | ICD-10-CM | POA: Diagnosis not present

## 2020-05-10 DIAGNOSIS — K3189 Other diseases of stomach and duodenum: Secondary | ICD-10-CM | POA: Diagnosis not present

## 2020-05-10 DIAGNOSIS — R1013 Epigastric pain: Secondary | ICD-10-CM | POA: Diagnosis not present

## 2020-05-10 DIAGNOSIS — K2950 Unspecified chronic gastritis without bleeding: Secondary | ICD-10-CM | POA: Insufficient documentation

## 2020-05-10 DIAGNOSIS — R1310 Dysphagia, unspecified: Secondary | ICD-10-CM | POA: Insufficient documentation

## 2020-05-10 DIAGNOSIS — J45909 Unspecified asthma, uncomplicated: Secondary | ICD-10-CM | POA: Insufficient documentation

## 2020-05-10 DIAGNOSIS — K222 Esophageal obstruction: Secondary | ICD-10-CM | POA: Insufficient documentation

## 2020-05-10 DIAGNOSIS — Z882 Allergy status to sulfonamides status: Secondary | ICD-10-CM | POA: Diagnosis not present

## 2020-05-10 DIAGNOSIS — Z7982 Long term (current) use of aspirin: Secondary | ICD-10-CM | POA: Diagnosis not present

## 2020-05-10 DIAGNOSIS — Z79899 Other long term (current) drug therapy: Secondary | ICD-10-CM | POA: Insufficient documentation

## 2020-05-10 DIAGNOSIS — E785 Hyperlipidemia, unspecified: Secondary | ICD-10-CM | POA: Insufficient documentation

## 2020-05-10 DIAGNOSIS — K317 Polyp of stomach and duodenum: Secondary | ICD-10-CM

## 2020-05-10 DIAGNOSIS — K2970 Gastritis, unspecified, without bleeding: Secondary | ICD-10-CM | POA: Diagnosis not present

## 2020-05-10 DIAGNOSIS — K319 Disease of stomach and duodenum, unspecified: Secondary | ICD-10-CM | POA: Insufficient documentation

## 2020-05-10 DIAGNOSIS — M1990 Unspecified osteoarthritis, unspecified site: Secondary | ICD-10-CM | POA: Insufficient documentation

## 2020-05-10 HISTORY — PX: BALLOON DILATION: SHX5330

## 2020-05-10 HISTORY — PX: BIOPSY: SHX5522

## 2020-05-10 HISTORY — PX: ESOPHAGOGASTRODUODENOSCOPY (EGD) WITH PROPOFOL: SHX5813

## 2020-05-10 SURGERY — ESOPHAGOGASTRODUODENOSCOPY (EGD) WITH PROPOFOL
Anesthesia: General

## 2020-05-10 MED ORDER — CHLORHEXIDINE GLUCONATE CLOTH 2 % EX PADS: 6.0000 | MEDICATED_PAD | Freq: Once | CUTANEOUS | Status: DC

## 2020-05-10 MED ORDER — GLYCOPYRROLATE 0.2 MG/ML IJ SOLN
INTRAMUSCULAR | Status: AC
  Filled 2020-05-10: qty 1

## 2020-05-10 MED ORDER — STERILE WATER FOR IRRIGATION IR SOLN
Status: DC | PRN
  Administered 2020-05-10: 100 mL

## 2020-05-10 MED ORDER — LIDOCAINE HCL (CARDIAC) PF 100 MG/5ML IV SOSY
PREFILLED_SYRINGE | INTRAVENOUS | Status: DC | PRN
  Administered 2020-05-10: 40 mg via INTRAVENOUS

## 2020-05-10 MED ORDER — LIDOCAINE VISCOUS HCL 2 % MT SOLN
15.0000 mL | Freq: Once | OROMUCOSAL | Status: AC
  Administered 2020-05-10: 15 mL via OROMUCOSAL

## 2020-05-10 MED ORDER — LACTATED RINGERS IV SOLN: Freq: Once | INTRAVENOUS | Status: AC

## 2020-05-10 MED ORDER — PROPOFOL 10 MG/ML IV BOLUS
INTRAVENOUS | Status: DC | PRN
  Administered 2020-05-10: 30 mg via INTRAVENOUS
  Administered 2020-05-10: 40 mg via INTRAVENOUS
  Administered 2020-05-10: 30 mg via INTRAVENOUS
  Administered 2020-05-10: 40 mg via INTRAVENOUS

## 2020-05-10 MED ORDER — LIDOCAINE VISCOUS HCL 2 % MT SOLN
OROMUCOSAL | Status: AC
  Filled 2020-05-10: qty 15

## 2020-05-10 MED ORDER — LACTATED RINGERS IV SOLN: INTRAVENOUS | Status: DC | PRN

## 2020-05-10 MED ORDER — OMEPRAZOLE 40 MG PO CPDR: 40.0000 mg | DELAYED_RELEASE_CAPSULE | Freq: Two times a day (BID) | ORAL | 3 refills | Status: DC

## 2020-05-10 NOTE — Op Note (Signed)
[facility] Patient Name: Jasmine Rhodes Procedure Date: 05/10/2020 1:00 PM MRN: [id number] Date of Birth: [birth year] Attending MD: Elon Alas. Abbey Chatters DO CSN: [id number] Age: 74 Admit Type: Outpatient Procedure:                Upper GI endoscopy Indications:              Dysphagia Providers:                Elon Alas. Abbey Chatters, DO, Lambert Mody, Crystal                            Page, Raphael Gibney, Technician Referring MD:              Medicines:                See the Anesthesia note for documentation of the                            administered medications Complications:            No immediate complications. Estimated Blood Loss:     Estimated blood loss was minimal. Procedure:                Pre-Anesthesia Assessment:                           - The anesthesia plan was to use monitored                            anesthesia care (MAC).                           After obtaining informed consent, the endoscope was                            passed under direct vision. Throughout the                            procedure, the patient's blood pressure, pulse, and                            oxygen saturations were monitored continuously. The                            [id number]-[id number] ([id number]) was introduced through the                            mouth, and advanced to the second part of duodenum.                            The upper GI endoscopy was accomplished without                            difficulty. The patient tolerated the procedure                            well. Scope In: 1:17:44 PM Scope Out:  1:23:38 PM Total Procedure Duration: 0 hours 5 minutes 54 seconds  Findings:      One benign-appearing, intrinsic mild stenosis was found in the lower       third of the esophagus. The stenosis was traversed. A TTS dilator was       passed through the scope. Dilation with an 18-19-20 mm balloon dilator       was performed to 20 mm. The dilation site was examined and showed        moderate improvement in luminal narrowing.      Localized moderate inflammation characterized by erosions and erythema       was found in the gastric antrum. Biopsies were taken with a cold forceps       for Helicobacter pylori testing.      Multiple sessile polyps were found in the duodenal bulb and in the first       portion of the duodenum. Biopsies were taken with a cold forceps for       histology. Impression:               - Benign-appearing esophageal stenosis. Dilated.                           - Gastritis. Biopsied.                           - Duodenal polyposis. Biopsied. Moderate Sedation:      Per Anesthesia Care Recommendation:           - Patient has a contact number available for                            emergencies. The signs and symptoms of potential                            delayed complications were discussed with the                            patient. Return to normal activities tomorrow.                            Written discharge instructions were provided to the                            patient.                           - Resume previous diet.                           - Continue present medications.                           - Await pathology results.                           - Repeat upper endoscopy PRN for retreatment.                           - Return to  GI office in 8 weeks.                           - Use Prilosec (omeprazole) 40 mg PO BID for 8                            weeks. Procedure Code(s):        --- Professional ---                           [id number], Esophagogastroduodenoscopy, flexible,                            transoral; with transendoscopic balloon dilation of                            esophagus (less than 30 mm diameter)                           43239, 59, Esophagogastroduodenoscopy, flexible,                            transoral; with biopsy, single or multiple Diagnosis Code(s):        --- Professional ---                            K22.2, Esophageal obstruction                           K29.70, Gastritis, unspecified, without bleeding                           K31.7, Polyp of stomach and duodenum                           R13.10, Dysphagia, unspecified CPT copyright 2019 American Medical Association. All rights reserved. The codes documented in this report are preliminary and upon coder review may  be revised to meet current compliance requirements. Elon Alas. Abbey Chatters, DO [place] Abbey Chatters, DO 05/10/2020 1:29:07 PM This report has been signed electronically. Number of Addenda: 0

## 2020-05-10 NOTE — Anesthesia Preprocedure Evaluation (Signed)
Anesthesia Evaluation  Patient identified by MRN, date of birth, ID band Patient awake    Reviewed: Allergy & Precautions, NPO status , Patient's Chart, lab work & pertinent test results  History of Anesthesia Complications Negative for: history of anesthetic complications  Airway Mallampati: II  TM Distance: >3 FB Neck ROM: Full    Dental  (+) Dental Advisory Given, Missing, Partial Upper   Pulmonary asthma , sleep apnea and Continuous Positive Airway Pressure Ventilation , Current Smoker,    Pulmonary exam normal breath sounds clear to auscultation       Cardiovascular Exercise Tolerance: Good hypertension, Pt. on medications Normal cardiovascular exam Rhythm:Regular Rate:Normal  01/16/17 -  Blood pressure demonstrated a normal response to exercise.  There was no ST segment deviation noted during stress.  Negative exercise stress test for ischemia. Duke treadmill score is 6, consistent with low risk for major cardiac events    Neuro/Psych  Neuromuscular disease    GI/Hepatic GERD  ,(+)     substance abuse  alcohol use,   Endo/Other  negative endocrine ROS  Renal/GU negative Renal ROS     Musculoskeletal  (+) Arthritis , Sciatica, back pain   Abdominal   Peds  Hematology negative hematology ROS (+)   Anesthesia Other Findings   Reproductive/Obstetrics                            Anesthesia Physical Anesthesia Plan  ASA: III  Anesthesia Plan: General   Post-op Pain Management:    Induction: Intravenous  PONV Risk Score and Plan: TIVA  Airway Management Planned: Nasal Cannula and Natural Airway  Additional Equipment:   Intra-op Plan:   Post-operative Plan:   Informed Consent: I have reviewed the patients History and Physical, chart, labs and discussed the procedure including the risks, benefits and alternatives for the proposed anesthesia with the patient or authorized  representative who has indicated his/her understanding and acceptance.     Dental advisory given  Plan Discussed with: CRNA and Surgeon  Anesthesia Plan Comments:         Anesthesia Quick Evaluation

## 2020-05-10 NOTE — Transfer of Care (Signed)
Immediate Anesthesia Transfer of Care Note  Patient: Aashvi Rezabek  Procedure(s) Performed: ESOPHAGOGASTRODUODENOSCOPY (EGD) WITH PROPOFOL (N/A ) BALLOON DILATION (N/A ) BIOPSY  Patient Location: PACU  Anesthesia Type:MAC and General  Level of Consciousness: awake, alert , oriented and patient cooperative  Airway & Oxygen Therapy: Patient Spontanous Breathing  Post-op Assessment: Report given to RN and Post -op Vital signs reviewed and stable  Post vital signs: Reviewed and stable  Last Vitals:  Vitals Value Taken Time  BP 119/82 05/10/20 1331  Temp 36.4 C 05/10/20 1333  Pulse 65 05/10/20 1334  Resp 22 05/10/20 1334  SpO2 100 % 05/10/20 1334  Vitals shown include unvalidated device data.  Last Pain:  Vitals:   05/10/20 1314  TempSrc:   PainSc: 0-No pain         Complications: No complications documented.

## 2020-05-10 NOTE — Discharge Instructions (Addendum)
EGD Discharge instructions Please read the instructions outlined below and refer to this sheet in the next few weeks. These discharge instructions provide you with general information on caring for yourself after you leave the hospital. Your doctor may also give you specific instructions. While your treatment has been planned according to the most current medical practices available, unavoidable complications occasionally occur. If you have any problems or questions after discharge, please call your doctor. ACTIVITY  You may resume your regular activity but move at a slower pace for the next 24 hours.   Take frequent rest periods for the next 24 hours.   Walking will help expel (get rid of) the air and reduce the bloated feeling in your abdomen.   No driving for 24 hours (because of the anesthesia (medicine) used during the test).   You may shower.   Do not sign any important legal documents or operate any machinery for 24 hours (because of the anesthesia used during the test).  NUTRITION  Drink plenty of fluids.   You may resume your normal diet.   Begin with a light meal and progress to your normal diet.   Avoid alcoholic beverages for 24 hours or as instructed by your caregiver.  MEDICATIONS  You may resume your normal medications unless your caregiver tells you otherwise.  WHAT YOU CAN EXPECT TODAY  You may experience abdominal discomfort such as a feeling of fullness or "gas" pains.  FOLLOW-UP  Your doctor will discuss the results of your test with you.  SEEK IMMEDIATE MEDICAL ATTENTION IF ANY OF THE FOLLOWING OCCUR:  Excessive nausea (feeling sick to your stomach) and/or vomiting.   Severe abdominal pain and distention (swelling).   Trouble swallowing.   Temperature over 101 F (37.8 C).   Rectal bleeding or vomiting of blood.   Your EGD showed a large amount of inflammation in your stomach.  I biopsied this to rule out a infection with a bacteria called H.  pylori.  You also have multiple small polyps in your duodenum which I biopsied.  We should get these results back within a week or so and my office will contact you.  You had a mild tightening of your esophagus which I dilated with a balloon.  Hopefully this helps with your swallowing.  I want to increase your omeprazole to 40 mg twice daily for the next 8 weeks and then can decrease back to once daily thereafter.  Follow-up with GI in 2 to 3 months or sooner if needed.  I hope you have a great rest of your week!  Hennie Duos. Marletta Lor, D.O. Gastroenterology and Hepatology [facility]  PATIENT INSTRUCTIONS POST-ANESTHESIA  IMMEDIATELY FOLLOWING SURGERY:  Do not drive or operate machinery for the first twenty four hours after surgery.  Do not make any important decisions for twenty four hours after surgery or while taking narcotic pain medications or sedatives.  If you develop intractable nausea and vomiting or a severe headache please notify your doctor immediately.  FOLLOW-UP:  Please make an appointment with your surgeon as instructed. You do not need to follow up with anesthesia unless specifically instructed to do so.  WOUND CARE INSTRUCTIONS (if applicable):  Keep a dry clean dressing on the anesthesia/puncture wound site if there is drainage.  Once the wound has quit draining you may leave it open to air.  Generally you should leave the bandage intact for twenty four hours unless there is drainage.  If the epidural site drains for more  than 36-48 hours please call the anesthesia department.  QUESTIONS?:  Please feel free to call your physician or the hospital operator if you have any questions, and they will be happy to assist you.

## 2020-05-10 NOTE — Interval H&P Note (Signed)
History and Physical Interval Note:  05/10/2020 12:43 PM  Jasmine Rhodes  has presented today for surgery, with the diagnosis of epi pain, gerd, abd bloating.  The various methods of treatment have been discussed with the patient and family. After consideration of risks, benefits and other options for treatment, the patient has consented to  Procedure(s) with comments: ESOPHAGOGASTRODUODENOSCOPY (EGD) WITH PROPOFOL (N/A) - 2:00pm BALLOON DILATION (N/A) as a surgical intervention.  The patient's history has been reviewed, patient examined, no change in status, stable for surgery.  I have reviewed the patient's chart and labs.  Questions were answered to the patient's satisfaction.     Lanelle Bal

## 2020-05-10 NOTE — Anesthesia Postprocedure Evaluation (Signed)
Anesthesia Post Note  Patient: Jasmine Rhodes  Procedure(s) Performed: ESOPHAGOGASTRODUODENOSCOPY (EGD) WITH PROPOFOL (N/A ) BALLOON DILATION (N/A ) BIOPSY  Patient location during evaluation: PACU Anesthesia Type: General Level of consciousness: awake and alert Pain management: pain level controlled Vital Signs Assessment: post-procedure vital signs reviewed and stable Respiratory status: spontaneous breathing Cardiovascular status: stable Postop Assessment: no apparent nausea or vomiting Anesthetic complications: no   No complications documented.   Last Vitals:  Vitals:   05/10/20 1241 05/10/20 1333  BP: 115/74   Pulse: (!) 59 66  Temp: 36.8 C (!) 36.4 C  SpO2: 100% 100%    Last Pain:  Vitals:   05/10/20 1314  TempSrc:   PainSc: 0-No pain                 Edison Pace

## 2020-05-12 ENCOUNTER — Other Ambulatory Visit: Payer: Self-pay

## 2020-05-12 LAB — SURGICAL PATHOLOGY

## 2020-05-13 ENCOUNTER — Encounter (HOSPITAL_COMMUNITY): Payer: Self-pay | Admitting: Internal Medicine

## 2020-05-30 DIAGNOSIS — F17210 Nicotine dependence, cigarettes, uncomplicated: Secondary | ICD-10-CM | POA: Diagnosis not present

## 2020-05-30 DIAGNOSIS — J301 Allergic rhinitis due to pollen: Secondary | ICD-10-CM | POA: Diagnosis not present

## 2020-05-30 DIAGNOSIS — I10 Essential (primary) hypertension: Secondary | ICD-10-CM | POA: Diagnosis not present

## 2020-07-06 ENCOUNTER — Encounter: Payer: Self-pay | Admitting: Internal Medicine

## 2020-07-06 ENCOUNTER — Other Ambulatory Visit: Payer: Self-pay

## 2020-07-06 ENCOUNTER — Ambulatory Visit (INDEPENDENT_AMBULATORY_CARE_PROVIDER_SITE_OTHER): Payer: Medicare Other | Admitting: Internal Medicine

## 2020-07-06 VITALS — BP 117/73 | HR 95 | Temp 96.6°F | Ht 65.0 in | Wt 148.2 lb

## 2020-07-06 DIAGNOSIS — K648 Other hemorrhoids: Secondary | ICD-10-CM

## 2020-07-06 DIAGNOSIS — R140 Abdominal distension (gaseous): Secondary | ICD-10-CM | POA: Diagnosis not present

## 2020-07-06 DIAGNOSIS — K219 Gastro-esophageal reflux disease without esophagitis: Secondary | ICD-10-CM | POA: Diagnosis not present

## 2020-07-06 MED ORDER — OMEPRAZOLE 40 MG PO CPDR: 40.0000 mg | DELAYED_RELEASE_CAPSULE | Freq: Every day | ORAL | 3 refills | Status: DC

## 2020-07-06 NOTE — Progress Notes (Signed)
Referring Provider: Benita Stabile, MD Primary Care Physician:  Benita Stabile, MD Primary GI:  Dr. Marletta Lor  Chief Complaint  Patient presents with  . Rectal Bleeding    when she wiped  . Bloated    HPI:   Jasmine Rhodes is a 74 y.o. female who presents to clinic today for follow-up with.  Recently underwent EGD for reflux and dysphagia.  Found to have a slight narrowing of her esophagus status post dilation with 20 mm balloon.  Patient states her dysphagia is resolved.  Chronic reflux well controlled on omeprazole 40 mg daily.  Patient does continue to have intermittent bloating.  Also notes one episode of bright red blood on tissue paper with recent bowel movement.  Colonoscopy 2018 unremarkable besides internal hemorrhoids and diverticular abscess.  Past Medical History:  Diagnosis Date  . Asthma   . DDD (degenerative disc disease), lumbar   . Hyperlipidemia   . Hypertension   . Osteoarthritis   . Sleep apnea     Past Surgical History:  Procedure Laterality Date  . BALLOON DILATION N/A 05/10/2020   Procedure: BALLOON DILATION;  Surgeon: Lanelle Bal, DO;  [facility];  Service: Endoscopy;  Laterality: N/A;  . BIOPSY  05/10/2020   Procedure: BIOPSY;  Surgeon: Lanelle Bal, DO;  Location: [facility];  Service: Endoscopy;;  . BUNIONECTOMY Bilateral 2015  . CARPAL TUNNEL RELEASE Right 03/30/2017  . COLONOSCOPY    . COLONOSCOPY N/A 04/25/2017   Procedure: COLONOSCOPY;  Surgeon: West Bali, MD;  Location: [facility];  Service: Endoscopy;  Laterality: N/A;  1200  . DG THUMB LEFT HAND    . ESOPHAGOGASTRODUODENOSCOPY N/A 04/25/2017   Procedure: ESOPHAGOGASTRODUODENOSCOPY (EGD);  Surgeon: West Bali, MD;  Location: [facility];  Service: Endoscopy;  Laterality: N/A;  . ESOPHAGOGASTRODUODENOSCOPY (EGD) WITH PROPOFOL N/A 05/10/2020   Procedure: ESOPHAGOGASTRODUODENOSCOPY (EGD) WITH PROPOFOL;  Surgeon: Lanelle Bal, DO;  Location: [facility];  Service: Endoscopy;  Laterality: N/A;  2:00pm  . SAVORY DILATION N/A 04/25/2017   Procedure: SAVORY DILATION;  Surgeon: West Bali, MD;  Location: [facility];  Service: Endoscopy;  Laterality: N/A;  . tonsils removal      Current Outpatient Medications  Medication Sig Dispense Refill  . acetaminophen (TYLENOL) 500 MG tablet Take 1,000 mg by mouth every 6 (six) hours as needed for moderate pain or headache.    . albuterol (VENTOLIN HFA) 108 (90 Base) MCG/ACT inhaler Inhale 2 puffs into the lungs every 6 (six) hours as needed for wheezing or shortness of breath.    . Ascorbic Acid (VITAMIN C) 1000 MG tablet Take 1,000 mg by mouth daily.    Marland Kitchen aspirin EC 81 MG tablet Take 81 mg by mouth daily.    Marland Kitchen atenolol (TENORMIN) 100 MG tablet Take 50 mg by mouth daily.    Marland Kitchen b complex vitamins capsule Take 1 capsule by mouth once a week.    . bismuth subsalicylate (PEPTO BISMOL) 262 MG/15ML suspension Take 30 mLs by mouth every 6 (six) hours as needed for diarrhea or loose stools.    . cetirizine (ZYRTEC) 10 MG tablet Take 10 mg by mouth daily as needed for allergies.     . cholecalciferol (VITAMIN D3) 25 MCG (1000 UNIT) tablet Take 1,000 Units by mouth daily.    . diclofenac sodium (VOLTAREN) 1 % GEL Apply 2 g topically daily as needed (for knee pain).   3  .  disulfiram (ANTABUSE) 250 MG tablet Take 250 mg by mouth as needed (alcohol relapse).     . fluticasone (FLONASE) 50 MCG/ACT nasal spray Place 1 spray into both nostrils as needed for allergies.     . hydrocortisone cream 1 % Apply 1 application topically daily as needed for itching.     . Multiple Vitamin (MULTIVITAMIN WITH MINERALS) TABS tablet Take 1 tablet by mouth daily.    Marland Kitchen omeprazole (PRILOSEC) 40 MG capsule Take 1 capsule (40 mg total) by mouth daily. 90 capsule 3  . Polyethyl Glycol-Propyl Glycol (SYSTANE ULTRA OP) Apply 1 drop to eye 2 (two) times daily as needed (dry eye relief).     . potassium citrate (UROCIT-K) 10 MEQ  (1080 MG) SR tablet Take 10 mEq by mouth daily.    . pravastatin (PRAVACHOL) 20 MG tablet Take 20 mg by mouth daily.    Marland Kitchen triamterene-hydrochlorothiazide (DYAZIDE) 37.5-25 MG capsule Take 1 capsule by mouth daily.     No current facility-administered medications for this visit.    Allergies as of 07/06/2020 - Review Complete 07/06/2020  Allergen Reaction Noted  . Sulfa antibiotics Rash 01/11/2017    Family History  Problem Relation Age of Onset  . Hypertension Mother   . Colon cancer Brother 31       Half-brother related by her mother    Social History   Socioeconomic History  . Marital status: Divorced    Spouse name: Not on file  . Number of children: Not on file  . Years of education: Not on file  . Highest education level: Not on file  Occupational History  . Not on file  Tobacco Use  . Smoking status: Current Every Day Smoker    Packs/day: 0.25    Years: 40.00    Pack years: 10.00    Types: Cigarettes  . Smokeless tobacco: Never Used  Vaping Use  . Vaping Use: Never used  Substance and Sexual Activity  . Alcohol use: Yes    Comment: occasional  . Drug use: No  . Sexual activity: Not on file  Other Topics Concern  . Not on file  Social History Narrative  . Not on file   Social Determinants of Health   Financial Resource Strain:   . Difficulty of Paying Living Expenses: Not on file  Food Insecurity:   . Worried About Programme researcher, broadcasting/film/video in the Last Year: Not on file  . Ran Out of Food in the Last Year: Not on file  Transportation Needs:   . Lack of Transportation (Medical): Not on file  . Lack of Transportation (Non-Medical): Not on file  Physical Activity:   . Days of Exercise per Week: Not on file  . Minutes of Exercise per Session: Not on file  Stress:   . Feeling of Stress : Not on file  Social Connections:   . Frequency of Communication with Friends and Family: Not on file  . Frequency of Social Gatherings with Friends and Family: Not on file   . Attends Religious Services: Not on file  . Active Member of Clubs or Organizations: Not on file  . Attends Banker Meetings: Not on file  . Marital Status: Not on file    Subjective: Review of Systems  Constitutional: Negative for chills and fever.  HENT: Negative for congestion and hearing loss.   Eyes: Negative for blurred vision and double vision.  Respiratory: Negative for cough and shortness of breath.   Cardiovascular: Negative for  chest pain and palpitations.  Gastrointestinal: Positive for blood in stool and heartburn. Negative for abdominal pain, constipation, diarrhea, melena and vomiting.  Genitourinary: Negative for dysuria and urgency.  Musculoskeletal: Negative for joint pain and myalgias.  Skin: Negative for itching and rash.  Neurological: Negative for dizziness and headaches.  Psychiatric/Behavioral: Negative for depression. The patient is not nervous/anxious.      Objective: BP 117/73   Pulse 95   Temp (!) 96.6 F (35.9 C) (Temporal)   Ht 5\' 5"  (1.651 m)   Wt 148 lb 3.2 oz (67.2 kg)   BMI 24.66 kg/m  Physical Exam Constitutional:      Appearance: Normal appearance.  HENT:     Head: Normocephalic and atraumatic.  Eyes:     Extraocular Movements: Extraocular movements intact.     Conjunctiva/sclera: Conjunctivae normal.  Cardiovascular:     Rate and Rhythm: Normal rate and regular rhythm.  Pulmonary:     Effort: Pulmonary effort is normal.     Breath sounds: Normal breath sounds.  Abdominal:     General: Bowel sounds are normal.     Palpations: Abdomen is soft.  Musculoskeletal:        General: No swelling. Normal range of motion.     Cervical back: Normal range of motion and neck supple.  Skin:    General: Skin is warm and dry.     Coloration: Skin is not jaundiced.  Neurological:     General: No focal deficit present.     Mental Status: She is alert and oriented to person, place, and time.  Psychiatric:        Mood and  Affect: Mood normal.        Behavior: Behavior normal.      Assessment: *Rectal bleeding-new, likely hemorrhoidal *Chronic reflux-well-controlled on omeprazole 40 mg daily *Bloating *Dysphagia-resolved status post esophageal dilation  Plan: Patient's reflux well controlled on omeprazole, will continue.  I refilled this prescription for patient today.  Dysphagia has resolved status post recent esophageal dilation.  Continue to monitor.  For her bloating recommended that she take over-the-counter simethicone as needed.  Regards to patient's rectal bleeding, this is likely hemorrhoidal.  Only 1 episode with recent bowel movement.  Colonoscopy 2018 unremarkable besides internal hemorrhoids.  Do not feel she warrants repeat colonoscopy at this juncture.  We will continue to monitor.  If this becomes a recurrent issue we can consider hemorrhoid banding in the outpatient clinic.  Patient to follow-up in 6 months or sooner if needed.  07/06/2020 10:35 AM   Disclaimer: This note was dictated with voice recognition software. Similar sounding words can inadvertently be transcribed and may not be corrected upon review.

## 2020-07-06 NOTE — Patient Instructions (Signed)
Continue on omeprazole 40 mg daily 30 minutes before breakfast.  I have sent in a year supply of refills to [facility].  Continue to watch your rectal bleeding.  If this becomes a recurrent issue, please give me a call and we can discuss hemorrhoid banding.  Otherwise I will see you in 6 months or sooner if needed.  We will tentatively plan on repeat colonoscopy in 7 years for colon cancer screening.  Hope you have a fantastic Christmas  At [facility] we value your feedback. You may receive a survey about your visit today. Please share your experience as we strive to create trusting relationships with our patients to provide genuine, compassionate, quality care.  We appreciate your understanding and patience as we review any laboratory studies, imaging, and other diagnostic tests that are ordered as we care for you. Our office policy is 5 business days for review of these results, and any emergent or urgent results are addressed in a timely manner for your best interest. If you do not hear from our office in 1 week, please contact us.   We also encourage the use of MyChart, which contains your medical information for your review as well. If you are not enrolled in this feature, an access code is on this after visit summary for your convenience. Thank you for allowing [place] to be involved in your care.  It was great to see you today!  I hope you have a great rest of your winter!!    Hennie Duos. Marletta Lor, D.O. Gastroenterology and Hepatology [facility]

## 2020-07-26 DIAGNOSIS — J302 Other seasonal allergic rhinitis: Secondary | ICD-10-CM | POA: Diagnosis not present

## 2020-07-26 DIAGNOSIS — E782 Mixed hyperlipidemia: Secondary | ICD-10-CM | POA: Diagnosis not present

## 2020-07-26 DIAGNOSIS — I10 Essential (primary) hypertension: Secondary | ICD-10-CM | POA: Diagnosis not present

## 2020-07-26 DIAGNOSIS — Y92538 Other ambulatory health services establishments as the place of occurrence of the external cause: Secondary | ICD-10-CM | POA: Diagnosis not present

## 2020-07-26 DIAGNOSIS — L989 Disorder of the skin and subcutaneous tissue, unspecified: Secondary | ICD-10-CM | POA: Diagnosis not present

## 2020-07-26 DIAGNOSIS — J069 Acute upper respiratory infection, unspecified: Secondary | ICD-10-CM | POA: Diagnosis not present

## 2020-11-27 ENCOUNTER — Emergency Department (HOSPITAL_COMMUNITY)
Admission: EM | Admit: 2020-11-27 | Discharge: 2020-11-27 | Disposition: A | Discharge status: Home / Self Care | Payer: Medicare Other | Attending: Emergency Medicine | Admitting: Emergency Medicine

## 2020-11-27 ENCOUNTER — Other Ambulatory Visit: Payer: Self-pay

## 2020-11-27 ENCOUNTER — Encounter (HOSPITAL_COMMUNITY): Payer: Self-pay | Admitting: *Deleted

## 2020-11-27 ENCOUNTER — Emergency Department (HOSPITAL_COMMUNITY): Payer: Medicare Other

## 2020-11-27 DIAGNOSIS — Z79899 Other long term (current) drug therapy: Secondary | ICD-10-CM | POA: Insufficient documentation

## 2020-11-27 DIAGNOSIS — Z7982 Long term (current) use of aspirin: Secondary | ICD-10-CM | POA: Insufficient documentation

## 2020-11-27 DIAGNOSIS — J4520 Mild intermittent asthma, uncomplicated: Secondary | ICD-10-CM | POA: Insufficient documentation

## 2020-11-27 DIAGNOSIS — M1120 Other chondrocalcinosis, unspecified site: Secondary | ICD-10-CM

## 2020-11-27 DIAGNOSIS — M25461 Effusion, right knee: Secondary | ICD-10-CM | POA: Insufficient documentation

## 2020-11-27 DIAGNOSIS — F17210 Nicotine dependence, cigarettes, uncomplicated: Secondary | ICD-10-CM | POA: Diagnosis not present

## 2020-11-27 DIAGNOSIS — I10 Essential (primary) hypertension: Secondary | ICD-10-CM | POA: Insufficient documentation

## 2020-11-27 DIAGNOSIS — M25561 Pain in right knee: Secondary | ICD-10-CM | POA: Diagnosis present

## 2020-11-27 DIAGNOSIS — M11261 Other chondrocalcinosis, right knee: Secondary | ICD-10-CM | POA: Insufficient documentation

## 2020-11-27 MED ORDER — DICLOFENAC SODIUM 1 % EX GEL: 2.0000 g | Freq: Four times a day (QID) | CUTANEOUS | 0 refills | Status: DC

## 2020-11-27 NOTE — ED Triage Notes (Signed)
Pt with right leg pain since yesterday.  Denies any injury to leg. Pt points to right knee and above knee where pain is, hurts to straighten leg out per pt.

## 2020-11-27 NOTE — Discharge Instructions (Addendum)
Arthritis in the knee as discussed.  Apply Voltaren gel as prescribed as needed for pain. Follow up with orthopedics, call tomorrow to schedule an appointment.

## 2020-11-27 NOTE — ED Provider Notes (Signed)
[facility] EMERGENCY DEPARTMENT Provider Note   CSN: [id number] Arrival date & time: 11/27/20  1149     History Chief Complaint  Patient presents with  . Leg Pain    Jasmine Rhodes is a 75 y.o. female.  75 year old female with complaint of right knee pain and swelling without injury. History of injury as a child, no problems since. Pain is worse with bearing weight and with flexion of the knee.  No other injuries, complaints, concerns.  Had x-ray several years ago while living in [place] and was told Jasmine Rhodes had arthritis but has never had a problem with this.        Past Medical History:  Diagnosis Date  . Asthma   . DDD (degenerative disc disease), lumbar   . Hyperlipidemia   . Hypertension   . Osteoarthritis   . Sleep apnea     Patient Active Problem List   Diagnosis Date Noted  . Bleeding internal hemorrhoids 07/06/2020  . GERD (gastroesophageal reflux disease) 04/20/2020  . Abdominal bloating 04/20/2020  . Gastritis and gastroduodenitis   . History of colonic polyps 03/11/2017  . Chronic RLQ pain 03/11/2017  . Abdominal pain, epigastric 03/11/2017  . Dysphagia 03/11/2017  . HTN (hypertension) 01/11/2017  . Overweight 01/11/2017  . Hyperlipidemia 01/11/2017  . Allergic rhinitis 01/11/2017  . Mild intermittent asthma 01/11/2017  . Osteoarthritis of both knees 01/11/2017  . Carpal tunnel syndrome, right 01/11/2017  . Chest pain 01/11/2017  . Stress 01/11/2017  . Lumbago with sciatica, right side 01/11/2017    Past Surgical History:  Procedure Laterality Date  . BALLOON DILATION N/A 05/10/2020   Procedure: BALLOON DILATION;  Surgeon: Lanelle Bal, DO;  [facility];  Service: Endoscopy;  Laterality: N/A;  . BIOPSY  05/10/2020   Procedure: BIOPSY;  Surgeon: Lanelle Bal, DO;  [facility];  Service: Endoscopy;;  . BUNIONECTOMY Bilateral 2015  . CARPAL TUNNEL RELEASE Right 03/30/2017  . COLONOSCOPY    . COLONOSCOPY  N/A 04/25/2017   Procedure: COLONOSCOPY;  Surgeon: West Bali, MD;  [facility];  Service: Endoscopy;  Laterality: N/A;  1200  . DG THUMB LEFT HAND    . ESOPHAGOGASTRODUODENOSCOPY N/A 04/25/2017   Procedure: ESOPHAGOGASTRODUODENOSCOPY (EGD);  Surgeon: West Bali, MD;  [facility];  Service: Endoscopy;  Laterality: N/A;  . ESOPHAGOGASTRODUODENOSCOPY (EGD) WITH PROPOFOL N/A 05/10/2020   Procedure: ESOPHAGOGASTRODUODENOSCOPY (EGD) WITH PROPOFOL;  Surgeon: Lanelle Bal, DO;  [facility];  Service: Endoscopy;  Laterality: N/A;  2:00pm  . SAVORY DILATION N/A 04/25/2017   Procedure: SAVORY DILATION;  Surgeon: West Bali, MD;  [facility]: [facility];  Service: Endoscopy;  Laterality: N/A;  . tonsils removal       OB History   No obstetric history on file.     Family History  Problem Relation Age of Onset  . Hypertension Mother   . Colon cancer Brother 69       Half-brother related by her mother    Social History   Tobacco Use  . Smoking status: Current Every Day Smoker    Packs/day: 0.25    Years: 40.00    Pack years: 10.00    Types: Cigarettes  . Smokeless tobacco: Never Used  Vaping Use  . Vaping Use: Never used  Substance Use Topics  . Alcohol use: Yes    Comment: occasional  . Drug use: No    Home Medications Prior  to Admission medications   Medication Sig Start Date End Date Taking? Authorizing Provider  diclofenac Sodium (VOLTAREN) 1 % GEL Apply 2 g topically 4 (four) times daily. 11/27/20  Yes Jeannie Fend, PA-C  acetaminophen (TYLENOL) 500 MG tablet Take 1,000 mg by mouth every 6 (six) hours as needed for moderate pain or headache.    [provider]  albuterol (VENTOLIN HFA) 108 (90 Base) MCG/ACT inhaler Inhale 2 puffs into the lungs every 6 (six) hours as needed for wheezing or shortness of breath.    [provider]  Ascorbic Acid (VITAMIN C) 1000 MG tablet Take 1,000 mg by mouth daily.     [provider]  aspirin EC 81 MG tablet Take 81 mg by mouth daily.    [provider]  atenolol (TENORMIN) 100 MG tablet Take 50 mg by mouth daily.    [provider]  b complex vitamins capsule Take 1 capsule by mouth once a week.    [provider]  bismuth subsalicylate (PEPTO BISMOL) 262 MG/15ML suspension Take 30 mLs by mouth every 6 (six) hours as needed for diarrhea or loose stools.    [provider]  cetirizine (ZYRTEC) 10 MG tablet Take 10 mg by mouth daily as needed for allergies.     [provider]  cholecalciferol (VITAMIN D3) 25 MCG (1000 UNIT) tablet Take 1,000 Units by mouth daily.    [provider]  diclofenac sodium (VOLTAREN) 1 % GEL Apply 2 g topically daily as needed (for knee pain).  03/19/18   [provider]  disulfiram (ANTABUSE) 250 MG tablet Take 250 mg by mouth as needed (alcohol relapse).  11/16/19   [provider]  fluticasone (FLONASE) 50 MCG/ACT nasal spray Place 1 spray into both nostrils as needed for allergies.     [provider]  hydrocortisone cream 1 % Apply 1 application topically daily as needed for itching.     [provider]  Multiple Vitamin (MULTIVITAMIN WITH MINERALS) TABS tablet Take 1 tablet by mouth daily.    [provider]  omeprazole (PRILOSEC) 40 MG capsule Take 1 capsule (40 mg total) by mouth daily. 07/06/20 07/06/21  Lanelle Bal, DO  Polyethyl Glycol-Propyl Glycol (SYSTANE ULTRA OP) Apply 1 drop to eye 2 (two) times daily as needed (dry eye relief).     [provider]  potassium citrate (UROCIT-K) 10 MEQ (1080 MG) SR tablet Take 10 mEq by mouth daily.    [provider]  pravastatin (PRAVACHOL) 20 MG tablet Take 20 mg by mouth daily.    [provider]  triamterene-hydrochlorothiazide (DYAZIDE) 37.5-25 MG capsule Take 1 capsule by mouth daily.    [provider]    Allergies    Sulfa  antibiotics  Review of Systems   Review of Systems  Constitutional: Negative for fever.  Musculoskeletal: Positive for arthralgias, gait problem and joint swelling.  Skin: Negative for color change, rash and wound.  Allergic/Immunologic: Negative for immunocompromised state.  Neurological: Negative for weakness and numbness.    Physical Exam Updated Vital Signs BP 124/82   Pulse 70   Temp 98.7 F (37.1 C) (Oral)   Resp 18   Ht 5\' 5"  (1.651 m)   Wt 66.7 kg   SpO2 100%   BMI 24.46 kg/m   Physical Exam Vitals and nursing note reviewed.  Constitutional:      General: Jasmine Rhodes is not in acute distress.    Appearance: Jasmine Rhodes is well-developed. Jasmine Rhodes  is not diaphoretic.  HENT:     Head: Normocephalic and atraumatic.  Cardiovascular:     Pulses: Normal pulses.  Pulmonary:     Effort: Pulmonary effort is normal.  Musculoskeletal:        General: Swelling and tenderness present. No deformity.     Right shoulder: Normal.     Left shoulder: Normal.     Right knee: Effusion and crepitus present. Decreased range of motion. Tenderness present. No LCL laxity or MCL laxity. Normal pulse.     Left knee: Normal.     Right lower leg: No edema.     Left lower leg: No edema.       Legs:  Skin:    General: Skin is warm and dry.     Findings: No bruising, erythema or rash.  Neurological:     Mental Status: Jasmine Rhodes is alert and oriented to person, place, and time.     Sensory: No sensory deficit.     Motor: No weakness.  Psychiatric:        Behavior: Behavior normal.     ED Results / Procedures / Treatments   Labs (all labs ordered are listed, but only abnormal results are displayed) Labs Reviewed - No data to display  EKG None  Radiology DG Knee Complete 4 Views Right  Result Date: 11/27/2020 CLINICAL DATA:  Knee pain. EXAM: RIGHT KNEE - COMPLETE 4+ VIEW COMPARISON:  None. FINDINGS: Chondrocalcinosis. No fracture or dislocation. There is a suprapatellar joint effusion. Minimal  degenerative changes. IMPRESSION: Chondrocalcinosis consistent with CPPD. Small to moderate suprapatellar joint effusion. No other abnormalities. Electronically Signed   By: Gerome Sam III M.D   On: 11/27/2020 13:52    Procedures Procedures   Medications Ordered in ED Medications - No data to display  ED Course  I have reviewed the triage vital signs and the nursing notes.  Pertinent labs & imaging results that were available during my care of the patient were reviewed by me and considered in my medical decision making (see chart for details).  Clinical Course as of 11/27/20 Rometta Emery Nov 27, 2020  3555 75 year old female presents with complaint of right knee pain and swelling without injury history of arthritis.  On exam found to have an effusion without overlying erythema. X-ray shows arthritic changes with effusion.  Plan is to follow-up with orthopedics.  Given prescription for Voltaren gel to apply [LM]    Clinical Course User Index [LM] Alden Hipp   MDM Rules/Calculators/A&P                          Final Clinical Impression(s) / ED Diagnoses Final diagnoses:  Chondrocalcinosis  Effusion, right knee    Rx / DC Orders ED Discharge Orders         Ordered    diclofenac Sodium (VOLTAREN) 1 % GEL  4 times daily        11/27/20 1418           Alden Hipp 11/27/20 1835    Terrilee Files, MD 11/28/20 [phone]

## 2021-01-02 ENCOUNTER — Other Ambulatory Visit: Payer: Self-pay | Admitting: Internal Medicine

## 2021-01-02 DIAGNOSIS — Z1231 Encounter for screening mammogram for malignant neoplasm of breast: Secondary | ICD-10-CM

## 2021-01-12 ENCOUNTER — Encounter: Payer: Self-pay | Admitting: Internal Medicine

## 2021-01-26 ENCOUNTER — Other Ambulatory Visit: Payer: Self-pay

## 2021-01-26 ENCOUNTER — Ambulatory Visit
Admission: RE | Admit: 2021-01-26 | Discharge: 2021-01-26 | Disposition: A | Discharge status: Home / Self Care | Payer: Medicare Other | Source: Ambulatory Visit | Attending: Internal Medicine | Admitting: Internal Medicine

## 2021-01-26 DIAGNOSIS — Z1231 Encounter for screening mammogram for malignant neoplasm of breast: Secondary | ICD-10-CM

## 2021-01-27 DIAGNOSIS — M1711 Unilateral primary osteoarthritis, right knee: Secondary | ICD-10-CM | POA: Diagnosis not present

## 2021-01-27 DIAGNOSIS — M25561 Pain in right knee: Secondary | ICD-10-CM | POA: Diagnosis not present

## 2021-02-22 DIAGNOSIS — M170 Bilateral primary osteoarthritis of knee: Secondary | ICD-10-CM | POA: Diagnosis not present

## 2021-02-24 ENCOUNTER — Ambulatory Visit: Payer: Medicare Other

## 2021-03-02 ENCOUNTER — Ambulatory Visit (HOSPITAL_COMMUNITY): Payer: Medicare Other | Admitting: Physical Therapy

## 2021-03-03 ENCOUNTER — Ambulatory Visit (HOSPITAL_COMMUNITY): Payer: Medicare Other | Admitting: Physical Therapy

## 2021-03-24 ENCOUNTER — Ambulatory Visit (HOSPITAL_COMMUNITY): Payer: Medicare Other | Attending: Physician Assistant | Admitting: Physical Therapy

## 2021-03-24 ENCOUNTER — Other Ambulatory Visit: Payer: Self-pay

## 2021-03-24 DIAGNOSIS — G8929 Other chronic pain: Secondary | ICD-10-CM | POA: Diagnosis not present

## 2021-03-24 DIAGNOSIS — M25661 Stiffness of right knee, not elsewhere classified: Secondary | ICD-10-CM | POA: Insufficient documentation

## 2021-03-24 DIAGNOSIS — M25561 Pain in right knee: Secondary | ICD-10-CM | POA: Insufficient documentation

## 2021-03-24 NOTE — Therapy (Signed)
[facility] [address] [place], [place], [address] Phone: [phone]   Fax:  [phone]  Physical Therapy Evaluation  Patient Details  Name: Jasmine Rhodes MRN: [id number] Date of Birth: Jul 22, 1946 Referring Provider (PT): Jodene Nam   Encounter Date: 03/24/2021   PT End of Session - 03/24/21 0913     Visit Number 1    Number of Visits 12    Date for PT Re-Evaluation 05/05/21    Authorization Type [employer]    Progress Note Due on Visit 10    PT Start Time [phone]    PT Stop Time 0910    PT Time Calculation (min) 37 min    Activity Tolerance Patient tolerated treatment well             Past Medical History:  Diagnosis Date   Asthma    DDD (degenerative disc disease), lumbar    Hyperlipidemia    Hypertension    Osteoarthritis    Sleep apnea     Past Surgical History:  Procedure Laterality Date   BALLOON DILATION N/A 05/10/2020   Procedure: BALLOON DILATION;  Surgeon: Lanelle Bal, DO;  [facility];  Service: Endoscopy;  Laterality: N/A;   BIOPSY  05/10/2020   Procedure: BIOPSY;  Surgeon: Lanelle Bal, DO;  [facility];  Service: Endoscopy;;   BREAST BIOPSY Right    BUNIONECTOMY Bilateral 2015   CARPAL TUNNEL RELEASE Right 03/30/2017   COLONOSCOPY     COLONOSCOPY N/A 04/25/2017   Procedure: COLONOSCOPY;  Surgeon: West Bali, MD;  [facility];  Service: Endoscopy;  Laterality: N/A;  1200   DG THUMB LEFT HAND     ESOPHAGOGASTRODUODENOSCOPY N/A 04/25/2017   Procedure: ESOPHAGOGASTRODUODENOSCOPY (EGD);  Surgeon: West Bali, MD;  [facility];  Service: Endoscopy;  Laterality: N/A;   ESOPHAGOGASTRODUODENOSCOPY (EGD) WITH PROPOFOL N/A 05/10/2020   Procedure: ESOPHAGOGASTRODUODENOSCOPY (EGD) WITH PROPOFOL;  Surgeon: Lanelle Bal, DO;  [facility];  Service: Endoscopy;  Laterality: N/A;  2:00pm   SAVORY DILATION N/A 04/25/2017    Procedure: SAVORY DILATION;  Surgeon: West Bali, MD;  [facility];  Service: Endoscopy;  Laterality: N/A;   tonsils removal      There were no vitals filed for this visit.    Subjective Assessment - 03/24/21 0843     Subjective Jasmine Rhodes states that she woke up in January and could not bend her knee due to the pain.  She has had knee pain for years but this caused her to go to the MD who drew off fluid and gave her one knee knee injection.  She is scheduled for a series of three injections.  The first injection seemed to help about 20% .    Pertinent History HTN,    How long can you sit comfortably? Able to sit for 30 minutes.    How long can you stand comfortably? 10-15    How long can you walk comfortably? PT does not do a lot of walking, just walks slow.    Patient Stated Goals less pain    Currently in Pain? Yes    Pain Score 5    Worst pain 10/10; Best 2/10   Pain Location Knee    Pain Orientation Right;Anterior    Pain Descriptors / Indicators Aching    Pain Type Chronic pain    Pain Onset More than a month ago  Pain Frequency Intermittent    Aggravating Factors  standing,steps and squating    Pain Relieving Factors meds                [facility] PT Assessment - 03/24/21 0001       Assessment   Medical Diagnosis Rt knee pain    Referring Provider (PT) Jodene NamStephen Chabon    Onset Date/Surgical Date 08/13/20    Next MD Visit 04/13/2021    Prior Therapy none      Precautions   Precautions None      Balance Screen   Has the patient fallen in the past 6 months No      Home Environment   Living Environment Private residence      Prior Function   Vocation Unemployed    Leisure sewing      Cognition   Overall Cognitive Status Within Functional Limits for tasks assessed      Observation/Other Assessments   Focus on Therapeutic Outcomes (FOTO)  31      Functional Tests   Functional tests Sit to Stand;Single leg stance      Single Leg Stance    Comments Rtt: 2" Lt: 10"      Sit to Stand   Comments unable      ROM / Strength   AROM / PROM / Strength AROM;Strength      AROM   AROM Assessment Site Knee    Right/Left Knee Right    Right Knee Extension 5    Right Knee Flexion 95      Strength   Strength Assessment Site Hip;Knee;Ankle    Right/Left Hip Right;Left    Right Hip Flexion 2+/5    Right Hip Extension 2+/5    Right Hip ABduction 4/5    Left Hip Flexion 4/5    Left Hip Extension 3-/5    Left Hip ABduction 5/5    Right/Left Knee Right;Left    Right Knee Flexion 3-/5    Right Knee Extension 5/5    Left Knee Flexion 3+/5    Left Knee Extension 5/5    Right/Left Ankle Right;Left      Ambulation/Gait   Gait Comments not tested as pt has flip flops on                        Objective measurements completed on examination: See above findings.       [facility] Adult PT Treatment/Exercise - 03/24/21 0001       Exercises   Exercises Knee/Hip      Knee/Hip Exercises: Supine   Quad Sets Strengthening;Both;10 reps    Heel Slides Right;10 reps    Straight Leg Raises Strengthening;Right;5 reps    Straight Leg Raises Limitations had to do with knee bent due to weakness                    PT Education - 03/24/21 0913     Education Details HEP    Person(s) Educated Patient    Methods Explanation;Handout    Comprehension Verbalized understanding              PT Short Term Goals - 03/24/21 0919       PT SHORT TERM GOAL #1   Title PT to be I in HEP to improve knee ROM to 3 or less extension and 115 flexion for improved function.    Time 3    Period Weeks    Status  New    Target Date 04/14/21      PT SHORT TERM GOAL #2   Title PT core and LE strength to improve to allow pt to be able to come sit to stand without UE assist from a chair.    Time 3    Period Weeks    Status New    Target Date 04/14/21      PT SHORT TERM GOAL #3   Title PT pain level in her RT knee to be no  greater than a 7/10 to allow pt improved activity tolerance as shown by foto score.    Time 3    Period Weeks    Status New               PT Long Term Goals - 03/24/21 5456       PT LONG TERM GOAL #1   Title PT to be I in an advance HEP to allow pt to improve in functional mobility.    Time 6    Period Weeks    Status New    Target Date 05/05/21      PT LONG TERM GOAL #2   Title PT core and LE strength to improve by one grade to allow pt to be able to go up and down 8 steps in a reciprocal manner using handrails.    Time 6    Period Weeks    Status New      PT LONG TERM GOAL #3   Title PT pain level in her Right knee to be no greater than a 5/10 to allow pt to be able to go grocery shopping in improved comfort.    Time 6    Period Weeks    Status New      PT LONG TERM GOAL #4   Title PT to be able to single leg stance on both legs for at least 15 seconds to demonstrate improved balance to reduce risk of falling    Time 6    Period Weeks    Status New                    Plan - 03/24/21 0914     Clinical Impression Statement Jasmine Rhodes is a 75 yo female who has had chronic Rt knee pain for years with a recent exacerbation going on since January.  She has had fluid pulled off her knee and has had one injection with limited improvement.  She is now being referred to skilled PT.  Evaluation demonstrates decreased ROM, decreased strength, decreased balance and increased pain.  Jasmine Rhodes will benefit from skilled PT to address these issues and maximize her functional ability.    Personal Factors and Comorbidities Age;Fitness;Time since onset of injury/illness/exacerbation    Examination-Activity Limitations Bend;Carry;Stand;Stairs;Squat    Examination-Participation Restrictions Cleaning;Laundry;Meal Prep;Community Activity    Stability/Clinical Decision Making Evolving/Moderate complexity    Clinical Decision Making Moderate    Rehab Potential Good    PT  Frequency 2x / week    PT Duration 6 weeks    PT Treatment/Interventions Gait training;Stair training;Therapeutic exercise;Balance training;Manual techniques    PT Next Visit Plan Continue to work on improving ROM and strength followed by balance.    PT Home Exercise Plan quad set, heel slide and SLR>    Consulted and Agree with Plan of Care Patient             Patient will benefit from skilled therapeutic intervention  in order to improve the following deficits and impairments:  Abnormal gait, Decreased activity tolerance, Decreased balance, Decreased range of motion, Decreased strength, Pain, Difficulty walking  Visit Diagnosis: Stiffness of right knee, not elsewhere classified - Plan: PT plan of care cert/re-cert  Chronic pain of right knee - Plan: PT plan of care cert/re-cert     Problem List Patient Active Problem List   Diagnosis Date Noted   Bleeding internal hemorrhoids 07/06/2020   GERD (gastroesophageal reflux disease) 04/20/2020   Abdominal bloating 04/20/2020   Gastritis and gastroduodenitis    History of colonic polyps 03/11/2017   Chronic RLQ pain 03/11/2017   Abdominal pain, epigastric 03/11/2017   Dysphagia 03/11/2017   HTN (hypertension) 01/11/2017   Overweight 01/11/2017   Hyperlipidemia 01/11/2017   Allergic rhinitis 01/11/2017   Mild intermittent asthma 01/11/2017   Osteoarthritis of both knees 01/11/2017   Carpal tunnel syndrome, right 01/11/2017   Chest pain 01/11/2017   Stress 01/11/2017   Lumbago with sciatica, right side 01/11/2017   Virgina Organ, PT CLT [phone] 03/24/2021, 9:26 AM  [facility] [address] [place], [place], [address] Phone: [phone]   Fax:  [phone]  Name: Jasmine Rhodes MRN: [id number] Date of Birth: 1946-07-15

## 2021-03-29 ENCOUNTER — Encounter (HOSPITAL_COMMUNITY): Payer: Medicare Other | Admitting: Physical Therapy

## 2021-03-30 ENCOUNTER — Other Ambulatory Visit: Payer: Self-pay

## 2021-03-30 ENCOUNTER — Ambulatory Visit (HOSPITAL_COMMUNITY): Payer: Medicare Other | Attending: Physician Assistant

## 2021-03-30 ENCOUNTER — Encounter (HOSPITAL_COMMUNITY): Payer: Self-pay

## 2021-03-30 ENCOUNTER — Encounter (HOSPITAL_COMMUNITY): Payer: Medicare Other

## 2021-03-30 DIAGNOSIS — M25661 Stiffness of right knee, not elsewhere classified: Secondary | ICD-10-CM | POA: Diagnosis not present

## 2021-03-30 DIAGNOSIS — M25561 Pain in right knee: Secondary | ICD-10-CM | POA: Insufficient documentation

## 2021-03-30 DIAGNOSIS — G8929 Other chronic pain: Secondary | ICD-10-CM | POA: Diagnosis not present

## 2021-03-30 DIAGNOSIS — M1711 Unilateral primary osteoarthritis, right knee: Secondary | ICD-10-CM | POA: Diagnosis not present

## 2021-03-30 NOTE — Patient Instructions (Signed)
KNEE: Extension, Long Arc Quad (Weight)    Place weight around leg. Raise leg until knee is straight. Hold _3-5__ seconds. DO NOT USE WEIGHT YET - Use ___ lb weight. _10-15__ reps per set, _1_ sets per day. Copyright  [employer]. All rights reserved.   3/8 INCH ADJUSTABLE HEEL LIFT IN LEFT SHOE. START WITH 1/8 INCH FIRST; ADD ANOTHER 1/8 INCH EVERY 3-5 DAYS IF YOU TOLERATE IT WELL.  -

## 2021-03-30 NOTE — Therapy (Signed)
[facility] [address] [place], [place], [address] Phone: [phone]   Fax:  [phone]  Physical Therapy Treatment  Patient Details  Name: Jasmine Rhodes MRN: [id number] Date of Birth: May 07, 1946 Referring Provider (PT): Gerrit Halls   Encounter Date: 03/30/2021   PT End of Session - 03/30/21 0849     Visit Number 2    Number of Visits 12    Date for PT Re-Evaluation 05/05/21    Authorization Type [employer]    Progress Note Due on Visit 10    PT Start Time 0850    PT Stop Time 0934    PT Time Calculation (min) 44 min    Activity Tolerance Patient tolerated treatment well             Past Medical History:  Diagnosis Date   Asthma    DDD (degenerative disc disease), lumbar    Hyperlipidemia    Hypertension    Osteoarthritis    Sleep apnea     Past Surgical History:  Procedure Laterality Date   BALLOON DILATION N/A 05/10/2020   Procedure: BALLOON DILATION;  Surgeon: Eloise Harman, DO;  [facility];  Service: Endoscopy;  Laterality: N/A;   BIOPSY  05/10/2020   Procedure: BIOPSY;  Surgeon: Eloise Harman, DO;  [facility];  Service: Endoscopy;;   BREAST BIOPSY Right    BUNIONECTOMY Bilateral 2015   CARPAL TUNNEL RELEASE Right 03/30/2017   COLONOSCOPY     COLONOSCOPY N/A 04/25/2017   Procedure: COLONOSCOPY;  Surgeon: Danie Binder, MD;  [facility];  Service: Endoscopy;  Laterality: N/A;  1200   DG THUMB LEFT HAND     ESOPHAGOGASTRODUODENOSCOPY N/A 04/25/2017   Procedure: ESOPHAGOGASTRODUODENOSCOPY (EGD);  Surgeon: Danie Binder, MD;  [facility];  Service: Endoscopy;  Laterality: N/A;   ESOPHAGOGASTRODUODENOSCOPY (EGD) WITH PROPOFOL N/A 05/10/2020   Procedure: ESOPHAGOGASTRODUODENOSCOPY (EGD) WITH PROPOFOL;  Surgeon: Eloise Harman, DO;  [facility];  Service: Endoscopy;  Laterality: N/A;  2:00pm   SAVORY DILATION N/A 04/25/2017    Procedure: SAVORY DILATION;  Surgeon: Danie Binder, MD;  [facility];  Service: Endoscopy;  Laterality: N/A;   tonsils removal      There were no vitals filed for this visit.   Subjective Assessment - 03/30/21 0849     Subjective Patient reports she has done her HEP but the one where she has to lift her right leg makes her left thigh cramp.    Pertinent History HTN,    How long can you sit comfortably? Able to sit for 30 minutes.    How long can you stand comfortably? 10-15    How long can you walk comfortably? PT does not do a lot of walking, just walks slow.    Patient Stated Goals less pain    Currently in Pain? Yes    Pain Score 5     Pain Location Knee    Pain Orientation Right;Anterior;Medial    Pain Descriptors / Indicators Aching;Dull    Pain Onset More than a month ago                [facility] Adult PT Treatment/Exercise - 03/30/21 0001       Ambulation/Gait   Ambulation/Gait Yes   2MWT   Ambulation/Gait Assistance 6: Modified independent (Device/Increase time)    Ambulation Distance (Feet) 300 Feet    Assistive device None  Gait Pattern Step-through pattern;Decreased stance time - right;Decreased stride length;Antalgic   R > L genu valgum     Posture/Postural Control   Posture/Postural Control Postural limitations    Posture Comments In supine, iliac crests and ASIS level, right medial malleolus depressed      Knee/Hip Exercises: Seated   Long Arc Quad Strengthening;Both;1 set;10 reps    Long Arc Quad Limitations 3 sec hold each      Knee/Hip Exercises: Supine   Quad Sets Strengthening;Both;10 reps    Heel Slides Right;10 reps    Straight Leg Raises Strengthening;Right;1 set;10 reps    Straight Leg Raises Limitations QS first    Knee Flexion AROM    Knee Flexion Limitations 122   increased from 115                   PT Education - 03/30/21 0926     Education Details Reviewed inital evaluation, goals, and initial HEP. Advanced  HEP.    Person(s) Educated Patient    Methods Explanation;Handout    Comprehension Verbalized understanding              PT Short Term Goals - 03/30/21 1254       PT SHORT TERM GOAL #1   Title PT to be I in HEP to improve knee ROM to 3 or less extension and 115 flexion for improved function.    Baseline 03/30/21 -right knee flexion to 122    Time 3    Period Weeks    Status Partially Met    Target Date 04/14/21      PT SHORT TERM GOAL #2   Title PT core and LE strength to improve to allow pt to be able to come sit to stand without UE assist from a chair.    Time 3    Period Weeks    Status On-going    Target Date 04/14/21      PT SHORT TERM GOAL #3   Title PT pain level in her RT knee to be no greater than a 7/10 to allow pt improved activity tolerance as shown by foto score.    Time 3    Period Weeks    Status On-going               PT Long Term Goals - 03/30/21 1256       PT LONG TERM GOAL #1   Title PT to be I in an advance HEP to allow pt to improve in functional mobility.    Time 6    Period Weeks    Status On-going      PT LONG TERM GOAL #2   Title PT core and LE strength to improve by one grade to allow pt to be able to go up and down 8 steps in a reciprocal manner using handrails.    Time 6    Period Weeks    Status On-going      PT LONG TERM GOAL #3   Title PT pain level in her Right knee to be no greater than a 5/10 to allow pt to be able to go grocery shopping in improved comfort.    Time 6    Period Weeks    Status On-going      PT LONG TERM GOAL #4   Title PT to be able to single leg stance on both legs for at least 15 seconds to demonstrate improved balance to reduce risk of falling  Time 6    Period Weeks    Status On-going                   Plan - 03/30/21 0849     Clinical Impression Statement Reviewed initial evaluation, goals and HEP. Performed 2MWT with patient exhibiting an antalgic gait over the RLE and right knee  genu valgum. In supine, patient exhibits a leg length discrepancy with right greater than left. Recommended patient trial a 3/8" adjustable heel lift starting with 1/8" in left shoe and increase by 1/8" to tolerance and report back how her right knee pain reacts. Added LAQ to therapeutic exercises and HEP. In reviewing HEP, focused on QS activation and ability to set knee prior to SLR. Patient had no complaints of left thigh cramping throughout session. Patient would continue to benefit from skilled physical therapy to reduce impairment and improve functional abilities.    Personal Factors and Comorbidities Age;Fitness;Time since onset of injury/illness/exacerbation    Examination-Activity Limitations Bend;Carry;Stand;Stairs;Squat    Examination-Participation Restrictions Cleaning;Laundry;Meal Prep;Community Activity    Stability/Clinical Decision Making Evolving/Moderate complexity    Rehab Potential Good    PT Frequency 2x / week    PT Duration 6 weeks    PT Treatment/Interventions Gait training;Stair training;Therapeutic exercise;Balance training;Manual techniques    PT Next Visit Plan Continue to work on improving ROM and strength followed by balance.    PT Home Exercise Plan quad set, heel slide and SLR; 9/1 - LAQ, 3/8" heel lift progression    Consulted and Agree with Plan of Care Patient             Patient will benefit from skilled therapeutic intervention in order to improve the following deficits and impairments:  Abnormal gait, Decreased activity tolerance, Decreased balance, Decreased range of motion, Decreased strength, Pain, Difficulty walking  Visit Diagnosis: Stiffness of right knee, not elsewhere classified  Chronic pain of right knee     Problem List Patient Active Problem List   Diagnosis Date Noted   Bleeding internal hemorrhoids 07/06/2020   GERD (gastroesophageal reflux disease) 04/20/2020   Abdominal bloating 04/20/2020   Gastritis and gastroduodenitis     History of colonic polyps 03/11/2017   Chronic RLQ pain 03/11/2017   Abdominal pain, epigastric 03/11/2017   Dysphagia 03/11/2017   HTN (hypertension) 01/11/2017   Overweight 01/11/2017   Hyperlipidemia 01/11/2017   Allergic rhinitis 01/11/2017   Mild intermittent asthma 01/11/2017   Osteoarthritis of both knees 01/11/2017   Carpal tunnel syndrome, right 01/11/2017   Chest pain 01/11/2017   Stress 01/11/2017   Lumbago with sciatica, right side 01/11/2017   Floria Raveling. Hartnett-Rands, MS, PT Per [facility] [phone]  Jeannie Done 03/30/2021, 12:58 PM  [facility] [address] [place], [place], [address] Phone: [phone]   Fax:  [phone]  Name: Cherise Fedder MRN: [id number] Date of Birth: 1946-06-13

## 2021-04-04 ENCOUNTER — Ambulatory Visit (HOSPITAL_COMMUNITY): Payer: Medicare Other | Admitting: Physical Therapy

## 2021-04-04 ENCOUNTER — Other Ambulatory Visit: Payer: Self-pay

## 2021-04-04 ENCOUNTER — Encounter (HOSPITAL_COMMUNITY): Payer: Self-pay | Admitting: Physical Therapy

## 2021-04-04 DIAGNOSIS — M25661 Stiffness of right knee, not elsewhere classified: Secondary | ICD-10-CM

## 2021-04-04 DIAGNOSIS — G8929 Other chronic pain: Secondary | ICD-10-CM | POA: Diagnosis not present

## 2021-04-04 DIAGNOSIS — M25561 Pain in right knee: Secondary | ICD-10-CM

## 2021-04-04 NOTE — Therapy (Signed)
[facility] [address] [place], [place], [address] Phone: [phone]   Fax:  [phone]  Physical Therapy Treatment  Patient Details  Name: Jasmine Rhodes MRN: [id number] Date of Birth: 1946/04/15 Referring Provider (PT): Gerrit Halls   Encounter Date: 04/04/2021   PT End of Session - 04/04/21 0750     Visit Number 3    Number of Visits 12    Date for PT Re-Evaluation 05/05/21    Authorization Type [employer]    Progress Note Due on Visit 10    PT Start Time 0750    PT Stop Time 0830    PT Time Calculation (min) 40 min    Activity Tolerance Patient tolerated treatment well             Past Medical History:  Diagnosis Date   Asthma    DDD (degenerative disc disease), lumbar    Hyperlipidemia    Hypertension    Osteoarthritis    Sleep apnea     Past Surgical History:  Procedure Laterality Date   BALLOON DILATION N/A 05/10/2020   Procedure: BALLOON DILATION;  Surgeon: Eloise Harman, DO;  [facility];  Service: Endoscopy;  Laterality: N/A;   BIOPSY  05/10/2020   Procedure: BIOPSY;  Surgeon: Eloise Harman, DO;  [facility];  Service: Endoscopy;;   BREAST BIOPSY Right    BUNIONECTOMY Bilateral 2015   CARPAL TUNNEL RELEASE Right 03/30/2017   COLONOSCOPY     COLONOSCOPY N/A 04/25/2017   Procedure: COLONOSCOPY;  Surgeon: Danie Binder, MD;  [facility];  Service: Endoscopy;  Laterality: N/A;  1200   DG THUMB LEFT HAND     ESOPHAGOGASTRODUODENOSCOPY N/A 04/25/2017   Procedure: ESOPHAGOGASTRODUODENOSCOPY (EGD);  Surgeon: Danie Binder, MD;  [facility];  Service: Endoscopy;  Laterality: N/A;   ESOPHAGOGASTRODUODENOSCOPY (EGD) WITH PROPOFOL N/A 05/10/2020   Procedure: ESOPHAGOGASTRODUODENOSCOPY (EGD) WITH PROPOFOL;  Surgeon: Eloise Harman, DO;  [facility];  Service: Endoscopy;  Laterality: N/A;  2:00pm   SAVORY DILATION N/A 04/25/2017    Procedure: SAVORY DILATION;  Surgeon: Danie Binder, MD;  [facility];  Service: Endoscopy;  Laterality: N/A;   tonsils removal      There were no vitals filed for this visit.   Subjective Assessment - 04/04/21 0751     Subjective Patient states she started injections last week. Knee still hurts, still a little tight. Exercises are going alright.    Pertinent History HTN,    How long can you sit comfortably? Able to sit for 30 minutes.    How long can you stand comfortably? 10-15    How long can you walk comfortably? PT does not do a lot of walking, just walks slow.    Patient Stated Goals less pain    Currently in Pain? Yes    Pain Score 1     Pain Location Knee    Pain Orientation Right    Pain Descriptors / Indicators Aching;Tightness    Pain Type Chronic pain    Pain Onset More than a month ago                               [facility] Adult PT Treatment/Exercise - 04/04/21 0001       Knee/Hip Exercises: Standing   Heel Raises Both;2 sets;10 reps    Heel Raises  Limitations TR 2x 10    Knee Flexion Both;2 sets;10 reps    Hip Abduction Both;2 sets;10 reps      Knee/Hip Exercises: Supine   Quad Sets Strengthening;Both;10 reps    Heel Slides Right;10 reps    Straight Leg Raises Strengthening;Right;10 reps;2 sets    Straight Leg Raises Limitations QS first, limited range      Knee/Hip Exercises: Sidelying   Hip ABduction Right;1 set;10 reps                    PT Education - 04/04/21 0751     Education Details HEP, exercise mechanics    Person(s) Educated Patient    Methods Explanation;Handout    Comprehension Verbalized understanding              PT Short Term Goals - 03/30/21 1254       PT SHORT TERM GOAL #1   Title PT to be I in HEP to improve knee ROM to 3 or less extension and 115 flexion for improved function.    Baseline 03/30/21 -right knee flexion to 122    Time 3    Period Weeks    Status Partially Met     Target Date 04/14/21      PT SHORT TERM GOAL #2   Title PT core and LE strength to improve to allow pt to be able to come sit to stand without UE assist from a chair.    Time 3    Period Weeks    Status On-going    Target Date 04/14/21      PT SHORT TERM GOAL #3   Title PT pain level in her RT knee to be no greater than a 7/10 to allow pt improved activity tolerance as shown by foto score.    Time 3    Period Weeks    Status On-going               PT Long Term Goals - 03/30/21 1256       PT LONG TERM GOAL #1   Title PT to be I in an advance HEP to allow pt to improve in functional mobility.    Time 6    Period Weeks    Status On-going      PT LONG TERM GOAL #2   Title PT core and LE strength to improve by one grade to allow pt to be able to go up and down 8 steps in a reciprocal manner using handrails.    Time 6    Period Weeks    Status On-going      PT LONG TERM GOAL #3   Title PT pain level in her Right knee to be no greater than a 5/10 to allow pt to be able to go grocery shopping in improved comfort.    Time 6    Period Weeks    Status On-going      PT LONG TERM GOAL #4   Title PT to be able to single leg stance on both legs for at least 15 seconds to demonstrate improved balance to reduce risk of falling    Time 6    Period Weeks    Status On-going                   Plan - 04/04/21 0751     Clinical Impression Statement Patient demonstrates significant quad weakness with difficulty completing SLR but is able to complete lacking  TKE but without extensor lag. Patient able to complete table hip exercises in small range secondary to impaired strength with good mechanics. Began standing exercises which patient tolerates well. Patient will continue to benefit from skilled physical therapy in order to reduce impairment and improve function.    Personal Factors and Comorbidities Age;Fitness;Time since onset of injury/illness/exacerbation     Examination-Activity Limitations Bend;Carry;Stand;Stairs;Squat    Examination-Participation Restrictions Cleaning;Laundry;Meal Prep;Community Activity    Stability/Clinical Decision Making Evolving/Moderate complexity    Rehab Potential Good    PT Frequency 2x / week    PT Duration 6 weeks    PT Treatment/Interventions Gait training;Stair training;Therapeutic exercise;Balance training;Manual techniques    PT Next Visit Plan Continue to work on improving ROM and strength followed by balance.    PT Home Exercise Plan quad set, heel slide and SLR; 9/1 - LAQ, 3/8" heel lift progression 9/6 small SLR, standing hip abduction    Consulted and Agree with Plan of Care Patient             Patient will benefit from skilled therapeutic intervention in order to improve the following deficits and impairments:  Abnormal gait, Decreased activity tolerance, Decreased balance, Decreased range of motion, Decreased strength, Pain, Difficulty walking  Visit Diagnosis: Stiffness of right knee, not elsewhere classified  Chronic pain of right knee     Problem List Patient Active Problem List   Diagnosis Date Noted   Bleeding internal hemorrhoids 07/06/2020   GERD (gastroesophageal reflux disease) 04/20/2020   Abdominal bloating 04/20/2020   Gastritis and gastroduodenitis    History of colonic polyps 03/11/2017   Chronic RLQ pain 03/11/2017   Abdominal pain, epigastric 03/11/2017   Dysphagia 03/11/2017   HTN (hypertension) 01/11/2017   Overweight 01/11/2017   Hyperlipidemia 01/11/2017   Allergic rhinitis 01/11/2017   Mild intermittent asthma 01/11/2017   Osteoarthritis of both knees 01/11/2017   Carpal tunnel syndrome, right 01/11/2017   Chest pain 01/11/2017   Stress 01/11/2017   Lumbago with sciatica, right side 01/11/2017    8:27 AM, 04/04/21 Mearl Latin PT, DPT Physical Therapist at [facility] [address] [place], [place], [address] Phone: [phone]   Fax:  [phone]  Name: Milka Windholz MRN: [id number] Date of Birth: 1946/07/14

## 2021-04-04 NOTE — Patient Instructions (Signed)
Access Code: [email or web address] URL: [email or web address] Date: 04/04/2021 Prepared by: Greig Castilla Jasmine Rhodes  Exercises Standing Hip Abduction with Counter Support - 1 x daily - 7 x weekly - 2 sets - 10 reps Small Range Straight Leg Raise (Mirrored) - 1 x daily - 7 x weekly - 2 sets - 10 reps

## 2021-04-06 ENCOUNTER — Other Ambulatory Visit: Payer: Self-pay

## 2021-04-06 ENCOUNTER — Encounter (HOSPITAL_COMMUNITY): Payer: Self-pay

## 2021-04-06 ENCOUNTER — Ambulatory Visit (HOSPITAL_COMMUNITY): Payer: Medicare Other

## 2021-04-06 DIAGNOSIS — M1711 Unilateral primary osteoarthritis, right knee: Secondary | ICD-10-CM | POA: Diagnosis not present

## 2021-04-06 DIAGNOSIS — G8929 Other chronic pain: Secondary | ICD-10-CM

## 2021-04-06 DIAGNOSIS — M25561 Pain in right knee: Secondary | ICD-10-CM | POA: Diagnosis not present

## 2021-04-06 DIAGNOSIS — M25661 Stiffness of right knee, not elsewhere classified: Secondary | ICD-10-CM | POA: Diagnosis not present

## 2021-04-06 NOTE — Therapy (Signed)
[facility] [address] [place], [place], [address] Phone: [phone]   Fax:  [phone]  Physical Therapy Treatment  Patient Details  Name: Jasmine Rhodes MRN: [id number] Date of Birth: 03/31/1946 Referring Provider (PT): Gerrit Halls   Encounter Date: 04/06/2021   PT End of Session - 04/06/21 0945     Visit Number 4    Number of Visits 12    Date for PT Re-Evaluation 05/05/21    Authorization Type [employer]    Progress Note Due on Visit 10    PT Start Time [phone]    PT Stop Time 1018    PT Time Calculation (min) 40 min    Activity Tolerance Patient tolerated treatment well             Past Medical History:  Diagnosis Date   Asthma    DDD (degenerative disc disease), lumbar    Hyperlipidemia    Hypertension    Osteoarthritis    Sleep apnea     Past Surgical History:  Procedure Laterality Date   BALLOON DILATION N/A 05/10/2020   Procedure: BALLOON DILATION;  Surgeon: Eloise Harman, DO;  [facility];  Service: Endoscopy;  Laterality: N/A;   BIOPSY  05/10/2020   Procedure: BIOPSY;  Surgeon: Eloise Harman, DO;  [facility];  Service: Endoscopy;;   BREAST BIOPSY Right    BUNIONECTOMY Bilateral 2015   CARPAL TUNNEL RELEASE Right 03/30/2017   COLONOSCOPY     COLONOSCOPY N/A 04/25/2017   Procedure: COLONOSCOPY;  Surgeon: Danie Binder, MD;  [facility];  Service: Endoscopy;  Laterality: N/A;  1200   DG THUMB LEFT HAND     ESOPHAGOGASTRODUODENOSCOPY N/A 04/25/2017   Procedure: ESOPHAGOGASTRODUODENOSCOPY (EGD);  Surgeon: Danie Binder, MD;  [facility];  Service: Endoscopy;  Laterality: N/A;   ESOPHAGOGASTRODUODENOSCOPY (EGD) WITH PROPOFOL N/A 05/10/2020   Procedure: ESOPHAGOGASTRODUODENOSCOPY (EGD) WITH PROPOFOL;  Surgeon: Eloise Harman, DO;  [facility];  Service: Endoscopy;  Laterality: N/A;  2:00pm   SAVORY DILATION N/A 04/25/2017    Procedure: SAVORY DILATION;  Surgeon: Danie Binder, MD;  [facility];  Service: Endoscopy;  Laterality: N/A;   tonsils removal      There were no vitals filed for this visit.   Subjective Assessment - 04/06/21 0941     Subjective Patient reports she gets her 2nd gel injections today. Hasn't felt a difference in pain of function yet. Has been doing her HEP most every day. Has been trying to walk around house some. Not sure she is doing a few of her exercises corrently.    Pertinent History HTN,    How long can you sit comfortably? Able to sit for 30 minutes.    How long can you stand comfortably? 10-15    How long can you walk comfortably? PT does not do a lot of walking, just walks slow.    Patient Stated Goals less pain    Currently in Pain? Yes    Pain Score 2     Pain Location Knee    Pain Orientation Right    Pain Descriptors / Indicators Aching;Dull;Sharp;Nagging    Pain Onset More than a month ago               [facility] Adult PT Treatment/Exercise - 04/06/21 0001       Knee/Hip Exercises: Standing   Heel Raises Both;2 sets;10 reps  Heel Raises Limitations TR 2x 10    Knee Flexion Both;2 sets;10 reps    Hip Abduction Both;10 reps;1 set      Knee/Hip Exercises: Seated   Long Arc Quad Strengthening;Both;1 set;10 reps    Sit to [employer] 2 sets;5 reps;without UE support   blue foam in chair     Knee/Hip Exercises: Supine   Short Arc Quad Sets AROM;Strengthening;Both;1 set;10 reps    Short Arc Quad Sets Limitations 3 sec hold                PT Education - 04/06/21 [phone]     Education Details Discussed purpose and technique of skilled interventions throughout session. Advanced HEP.    Person(s) Educated Patient    Methods Explanation;Handout    Comprehension Verbalized understanding              PT Short Term Goals - 04/06/21 0946       PT SHORT TERM GOAL #1   Title PT to be I in HEP to improve knee ROM to 3 or less extension and 115  flexion for improved function.    Baseline 03/30/21 -right knee flexion to 122    Time 3    Period Weeks    Status Partially Met    Target Date 04/14/21      PT SHORT TERM GOAL #2   Title PT core and LE strength to improve to allow pt to be able to come sit to stand without UE assist from a chair.    Time 3    Period Weeks    Status On-going    Target Date 04/14/21      PT SHORT TERM GOAL #3   Title PT pain level in her RT knee to be no greater than a 7/10 to allow pt improved activity tolerance as shown by foto score.    Baseline 9/822 - patient reports highest a 5/10 in the last week.    Time 3    Period Weeks    Status Achieved               PT Long Term Goals - 04/06/21 1005       PT LONG TERM GOAL #1   Title PT to be I in an advance HEP to allow pt to improve in functional mobility.    Time 6    Period Weeks    Status On-going      PT LONG TERM GOAL #2   Title PT core and LE strength to improve by one grade to allow pt to be able to go up and down 8 steps in a reciprocal manner using handrails.    Time 6    Period Weeks    Status On-going      PT LONG TERM GOAL #3   Title PT pain level in her Right knee to be no greater than a 5/10 to allow pt to be able to go grocery shopping in improved comfort.    Time 6    Period Weeks    Status On-going      PT LONG TERM GOAL #4   Title PT to be able to single leg stance on both legs for at least 15 seconds to demonstrate improved balance to reduce risk of falling    Time 6    Period Weeks    Status On-going                   Plan -  04/06/21 0945     Clinical Impression Statement Session focused on lower extremity and core strengthening in closed and open chair. Reviewed HEP and implemented corrections with good patient understanding. Significant weakness noted in right hamstrings and quadriceps.  Added sit to stand and short arc quads to therapeutic exercises and HEP. Patient required 22 inch seat height  and additional time to complete without UE assist. Patient will continue to benefit from skilled physical therapy in order to reduce impairment and improve function.    Personal Factors and Comorbidities Age;Fitness;Time since onset of injury/illness/exacerbation    Examination-Activity Limitations Bend;Carry;Stand;Stairs;Squat    Examination-Participation Restrictions Cleaning;Laundry;Meal Prep;Community Activity    Stability/Clinical Decision Making Evolving/Moderate complexity    Rehab Potential Good    PT Frequency 2x / week    PT Duration 6 weeks    PT Treatment/Interventions Gait training;Stair training;Therapeutic exercise;Balance training;Manual techniques    PT Next Visit Plan Continue to work on improving ROM and strength followed by balance; review new HEP and sidelying hip abd.    PT Home Exercise Plan quad set, heel slide and SLR; 9/1 - LAQ, 3/8" heel lift progression 9/6 small SLR, standing hip abduction; 9/8 - STS, SAQ    Consulted and Agree with Plan of Care Patient             Patient will benefit from skilled therapeutic intervention in order to improve the following deficits and impairments:  Abnormal gait, Decreased activity tolerance, Decreased balance, Decreased range of motion, Decreased strength, Pain, Difficulty walking  Visit Diagnosis: Stiffness of right knee, not elsewhere classified  Chronic pain of right knee     Problem List Patient Active Problem List   Diagnosis Date Noted   Bleeding internal hemorrhoids 07/06/2020   GERD (gastroesophageal reflux disease) 04/20/2020   Abdominal bloating 04/20/2020   Gastritis and gastroduodenitis    History of colonic polyps 03/11/2017   Chronic RLQ pain 03/11/2017   Abdominal pain, epigastric 03/11/2017   Dysphagia 03/11/2017   HTN (hypertension) 01/11/2017   Overweight 01/11/2017   Hyperlipidemia 01/11/2017   Allergic rhinitis 01/11/2017   Mild intermittent asthma 01/11/2017   Osteoarthritis of both  knees 01/11/2017   Carpal tunnel syndrome, right 01/11/2017   Chest pain 01/11/2017   Stress 01/11/2017   Lumbago with sciatica, right side 01/11/2017   Jasmine Raveling. Hartnett-Rands, MS, PT Per [place] [phone]  Jeannie Done, PT 04/06/2021, 10:24 AM  [facility] [address] [place], [place], [address] Phone: [phone]   Fax:  [phone]  Name: Jasmine Rhodes MRN: [id number] Date of Birth: 1946-06-12

## 2021-04-06 NOTE — Patient Instructions (Addendum)
Sit-to-Stand Exercise The sit-to-stand exercise (also known as the chair stand or chair rise exercise) strengthens your lower body and helps you maintain or improve your mobility and independence. The end goal is to do the sit-to-stand exercise without using your hands. This will be easier as you become stronger. You should always talk with your health care provider before starting any exercise program, especially if you have had recent surgery. Do the exercise exactly as told by your health care provider and adjust it as directed. It is normal to feel mild stretching, pulling, tightness, or discomfort as you do this exercise, but you should stop right away if you feel sudden pain or your pain gets worse. Do not begin doing this exercise until told by your health care provider. What the sit-to-stand exercise does The sit-to-stand exercise helps to strengthen the muscles in your thighs and the muscles in the center of your body that give you stability (core muscles). This exercise is especially helpful if: You have had knee or hip surgery. You have trouble getting up from a chair, out of a car, or off the toilet due to muscle weakness. How to do the sit-to-stand exercise Sit toward the front edge of a sturdy chair without armrests. Your knees should be bent and your feet should be flat on the floor and shoulder-width apart and underneath your hips. Place your hands lightly on each side of the seat. Keep your back and neck as straight as possible, with your chest slightly forward. Breathe in slowly. Lean forward and slightly shift your weight to the front of your feet. Breathe out as you slowly stand up. Try not to support any weight with your hands. Stand and pause for a full breath in and out. Breathe in as you sit down slowly. Tighten your core and abdominal muscles to control your lowering as much as possible. You should lower yourself back to the chair slowly, not just drop back into the  seat. Breathe out slowly. Do this exercise 10-15 times. If needed, do it fewer times until you build up strength. Rest for 1 minute, then do another set of 10-15 repetitions. To change the difficulty of the sit-to-stand exercise If the exercise is too difficult, use a chair with sturdy armrests, and push off the armrests to help you come to the standing position. You can also use the armrests to help slowly lower yourself back to sitting. As this gets easier, try to use your arms less. You can also place a firm cushion or pillow on the chair to make the surface higher. If this exercise is too easy, do not use your arms to help raise or lower yourself. You can also wear a weighted vest, use hand weights, increase your repetitions, or try a lower chair. General tips You may feel tired when starting an exercise routine. This is normal. You may have muscle soreness that lasts a few days. This is normal. As you get stronger, you may not feel muscle soreness. Use smooth, steady movements. Do not  hold your breath during strength exercises. This can cause unsafe changes in your blood pressure. Breathe in slowly through your nose, and breathe out slowly through your mouth. Summary Strengthening your lower body is an important step to help you move safely and independently. The sit-to-stand exercise helps strengthen the muscles in your thighs and core. You should always talk with your health care provider before starting any exercise program, especially if you have had recent surgery. This information   is not intended to replace advice given to you by your health care provider. Make sure you discuss any questions you have with your health care provider. Document Revised: 11/06/2020 Document Reviewed: 11/06/2020 Elsevier Patient Education  2022 [employer].  Short Arc [place]    Place a large can or rolled towel under left leg. Straighten leg. Hold _3-5_ seconds. Repeat _10-15_ times. Do _1_ sessions  per day.  http://gt2.exer.us/299   Copyright  VHI. All rights reserved.

## 2021-04-10 ENCOUNTER — Other Ambulatory Visit: Payer: Self-pay

## 2021-04-10 ENCOUNTER — Encounter (HOSPITAL_COMMUNITY): Payer: Self-pay

## 2021-04-10 ENCOUNTER — Ambulatory Visit (HOSPITAL_COMMUNITY): Payer: Medicare Other

## 2021-04-10 DIAGNOSIS — M25661 Stiffness of right knee, not elsewhere classified: Secondary | ICD-10-CM

## 2021-04-10 DIAGNOSIS — G8929 Other chronic pain: Secondary | ICD-10-CM | POA: Diagnosis not present

## 2021-04-10 DIAGNOSIS — M25561 Pain in right knee: Secondary | ICD-10-CM | POA: Diagnosis not present

## 2021-04-10 NOTE — Therapy (Signed)
[facility] [address] [place], [place], [address] Phone: [phone]   Fax:  [phone]  Physical Therapy Treatment  Patient Details  Name: Jasmine Rhodes MRN: [id number] Date of Birth: 1946-02-09 Referring Provider (PT): Gerrit Halls   Encounter Date: 04/10/2021   PT End of Session - 04/10/21 0849     Visit Number 5    Number of Visits 12    Date for PT Re-Evaluation 05/05/21    Authorization Type [employer]    Progress Note Due on Visit 10    PT Start Time 0850    PT Stop Time 0930    PT Time Calculation (min) 40 min    Activity Tolerance Patient tolerated treatment well             Past Medical History:  Diagnosis Date   Asthma    DDD (degenerative disc disease), lumbar    Hyperlipidemia    Hypertension    Osteoarthritis    Sleep apnea     Past Surgical History:  Procedure Laterality Date   BALLOON DILATION N/A 05/10/2020   Procedure: BALLOON DILATION;  Surgeon: Eloise Harman, DO;  [facility];  Service: Endoscopy;  Laterality: N/A;   BIOPSY  05/10/2020   Procedure: BIOPSY;  Surgeon: Eloise Harman, DO;  [facility];  Service: Endoscopy;;   BREAST BIOPSY Right    BUNIONECTOMY Bilateral 2015   CARPAL TUNNEL RELEASE Right 03/30/2017   COLONOSCOPY     COLONOSCOPY N/A 04/25/2017   Procedure: COLONOSCOPY;  Surgeon: Danie Binder, MD;  [facility];  Service: Endoscopy;  Laterality: N/A;  1200   DG THUMB LEFT HAND     ESOPHAGOGASTRODUODENOSCOPY N/A 04/25/2017   Procedure: ESOPHAGOGASTRODUODENOSCOPY (EGD);  Surgeon: Danie Binder, MD;  [facility];  Service: Endoscopy;  Laterality: N/A;   ESOPHAGOGASTRODUODENOSCOPY (EGD) WITH PROPOFOL N/A 05/10/2020   Procedure: ESOPHAGOGASTRODUODENOSCOPY (EGD) WITH PROPOFOL;  Surgeon: Eloise Harman, DO;  [facility];  Service: Endoscopy;  Laterality: N/A;  2:00pm   SAVORY DILATION N/A 04/25/2017    Procedure: SAVORY DILATION;  Surgeon: Danie Binder, MD;  [facility];  Service: Endoscopy;  Laterality: N/A;   tonsils removal      There were no vitals filed for this visit.   Subjective Assessment - 04/10/21 0849     Subjective Patient reports right knee has been stiff, sore, and throbbing over the weekend since getting 2nd injection Thursday. Gets 3rd injection this Thursday. More painful today overall.    Pertinent History HTN,    How long can you sit comfortably? Able to sit for 30 minutes.    How long can you stand comfortably? 10-15    How long can you walk comfortably? PT does not do a lot of walking, just walks slow.    Patient Stated Goals less pain    Currently in Pain? Yes    Pain Score 5     Pain Location Knee    Pain Descriptors / Indicators Throbbing;Sore;Tightness    Pain Onset More than a month ago                [facility] Adult PT Treatment/Exercise - 04/10/21 0001       Knee/Hip Exercises: Standing   Heel Raises Both;2 sets;10 reps    Heel Raises Limitations TR 2x 10    Knee Flexion Both;2 sets;10 reps    Hip Abduction Both;10 reps;2  sets      Knee/Hip Exercises: Seated   Long Arc Quad Strengthening;Both;1 set;10 reps    Long Arc Quad Limitations 3 sec hold    Sit to [employer] 2 sets;10 reps   without UE support     Knee/Hip Exercises: Supine   Quad Sets Strengthening;Both;10 reps    [employer] Limitations 5 sec hold    Short Arc [employer] AROM;Strengthening;Both;1 set;10 reps    Short Arc [employer] Limitations 5 sec hold    Straight Leg Raises Strengthening;Right;10 reps;2 sets    Straight Leg Raises Limitations QS first, improved range      Knee/Hip Exercises: Sidelying   Hip ABduction Right;1 set;5 reps   difficult to coordinate     Knee/Hip Exercises: Prone   Hip Extension Strengthening;Right;1 set;15 reps                     PT Education - 04/10/21 0859     Education Details Discussed purpose and technique of  skilled interventions throughout session. Advanced HEP    Person(s) Educated Patient    Methods Explanation;Handout    Comprehension Verbalized understanding              PT Short Term Goals - 04/06/21 0946       PT SHORT TERM GOAL #1   Title PT to be I in HEP to improve knee ROM to 3 or less extension and 115 flexion for improved function.    Baseline 03/30/21 -right knee flexion to 122    Time 3    Period Weeks    Status Partially Met    Target Date 04/14/21      PT SHORT TERM GOAL #2   Title PT core and LE strength to improve to allow pt to be able to come sit to stand without UE assist from a chair.    Time 3    Period Weeks    Status On-going    Target Date 04/14/21      PT SHORT TERM GOAL #3   Title PT pain level in her RT knee to be no greater than a 7/10 to allow pt improved activity tolerance as shown by foto score.    Baseline 9/822 - patient reports highest a 5/10 in the last week.    Time 3    Period Weeks    Status Achieved               PT Long Term Goals - 04/06/21 1005       PT LONG TERM GOAL #1   Title PT to be I in an advance HEP to allow pt to improve in functional mobility.    Time 6    Period Weeks    Status On-going      PT LONG TERM GOAL #2   Title PT core and LE strength to improve by one grade to allow pt to be able to go up and down 8 steps in a reciprocal manner using handrails.    Time 6    Period Weeks    Status On-going      PT LONG TERM GOAL #3   Title PT pain level in her Right knee to be no greater than a 5/10 to allow pt to be able to go grocery shopping in improved comfort.    Time 6    Period Weeks    Status On-going      PT LONG TERM GOAL #4  Title PT to be able to single leg stance on both legs for at least 15 seconds to demonstrate improved balance to reduce risk of falling    Time 6    Period Weeks    Status On-going                   Plan - 04/10/21 0849     Personal Factors and Comorbidities  Age;Fitness;Time since onset of injury/illness/exacerbation    Examination-Activity Limitations Bend;Carry;Stand;Stairs;Squat    Examination-Participation Restrictions Cleaning;Laundry;Meal Prep;Community Activity    Stability/Clinical Decision Making Evolving/Moderate complexity    Rehab Potential Good    PT Frequency 2x / week    PT Duration 6 weeks    PT Treatment/Interventions Gait training;Stair training;Therapeutic exercise;Balance training;Manual techniques    PT Next Visit Plan Continue to work on improving ROM and strength followed by balance; review new HEP and sidelying hip abd.    PT Home Exercise Plan quad set, heel slide and SLR; 9/1 - LAQ, 3/8" heel lift progression 9/6 small SLR, standing hip abduction; 9/8 - STS, SAQ; 9/12 - prone hip ext    Consulted and Agree with Plan of Care Patient             Patient will benefit from skilled therapeutic intervention in order to improve the following deficits and impairments:  Abnormal gait, Decreased activity tolerance, Decreased balance, Decreased range of motion, Decreased strength, Pain, Difficulty walking  Visit Diagnosis: Stiffness of right knee, not elsewhere classified  Chronic pain of right knee     Problem List Patient Active Problem List   Diagnosis Date Noted   Bleeding internal hemorrhoids 07/06/2020   GERD (gastroesophageal reflux disease) 04/20/2020   Abdominal bloating 04/20/2020   Gastritis and gastroduodenitis    History of colonic polyps 03/11/2017   Chronic RLQ pain 03/11/2017   Abdominal pain, epigastric 03/11/2017   Dysphagia 03/11/2017   HTN (hypertension) 01/11/2017   Overweight 01/11/2017   Hyperlipidemia 01/11/2017   Allergic rhinitis 01/11/2017   Mild intermittent asthma 01/11/2017   Osteoarthritis of both knees 01/11/2017   Carpal tunnel syndrome, right 01/11/2017   Chest pain 01/11/2017   Stress 01/11/2017   Lumbago with sciatica, right side 01/11/2017   Floria Raveling. Hartnett-Rands,  MS, PT Per [facility] [phone]  Jeannie Done, PT 04/10/2021, 9:30 AM  [facility] [address] [place], [place], [address] Phone: [phone]   Fax:  [phone]  Name: Jasmine Rhodes MRN: [id number] Date of Birth: 01-31-1946

## 2021-04-10 NOTE — Patient Instructions (Addendum)
Short Arc [employer] a large can or rolled towel under left leg. Straighten leg. Hold 3-5_ seconds. Repeat _10-15_ times. Do _1_ sessions per day.  http://gt2.exer.us/299   Copyright  VHI. All rights reserved.   Prone Hip and Knee Extension    Try to lift operated leg, keeping knee as straight as possible. Do not lift or turn hips. Hold 3-5_ seconds. Repeat _10-15_ times. Do _1_ sessions per day.  http://gt2.exer.us/309   Copyright  VHI. All rights reserved.

## 2021-04-12 ENCOUNTER — Ambulatory Visit (HOSPITAL_COMMUNITY): Payer: Medicare Other | Admitting: Physical Therapy

## 2021-04-12 ENCOUNTER — Other Ambulatory Visit: Payer: Self-pay

## 2021-04-12 ENCOUNTER — Encounter (HOSPITAL_COMMUNITY): Payer: Self-pay | Admitting: Physical Therapy

## 2021-04-12 DIAGNOSIS — G8929 Other chronic pain: Secondary | ICD-10-CM | POA: Diagnosis not present

## 2021-04-12 DIAGNOSIS — M25661 Stiffness of right knee, not elsewhere classified: Secondary | ICD-10-CM

## 2021-04-12 DIAGNOSIS — M25561 Pain in right knee: Secondary | ICD-10-CM | POA: Diagnosis not present

## 2021-04-12 NOTE — Therapy (Signed)
[facility] [address] [place], [place], [address] Phone: [phone]   Fax:  [phone]  Physical Therapy Treatment  Patient Details  Name: Jasmine Rhodes MRN: [id number] Date of Birth: [birth year] Referring Provider (PT): Gerrit Halls   Encounter Date: 04/12/2021   PT End of Session - 04/12/21 0835     Visit Number 6    Number of Visits 12    Date for PT Re-Evaluation 05/05/21    Authorization Type [employer]    Progress Note Due on Visit 10    PT Start Time 0835    PT Stop Time 0915    PT Time Calculation (min) 40 min    Activity Tolerance Patient tolerated treatment well    Behavior During Therapy [facility] for tasks assessed/performed             Past Medical History:  Diagnosis Date   Asthma    DDD (degenerative disc disease), lumbar    Hyperlipidemia    Hypertension    Osteoarthritis    Sleep apnea     Past Surgical History:  Procedure Laterality Date   BALLOON DILATION N/A 05/10/2020   Procedure: BALLOON DILATION;  Surgeon: Eloise Harman, DO;  [facility];  Service: Endoscopy;  Laterality: N/A;   BIOPSY  05/10/2020   Procedure: BIOPSY;  Surgeon: Eloise Harman, DO;  [facility];  Service: Endoscopy;;   BREAST BIOPSY Right    BUNIONECTOMY Bilateral 2015   CARPAL TUNNEL RELEASE Right 03/30/2017   COLONOSCOPY     COLONOSCOPY N/A 04/25/2017   Procedure: COLONOSCOPY;  Surgeon: Danie Binder, MD;  [facility];  Service: Endoscopy;  Laterality: N/A;  1200   DG THUMB LEFT HAND     ESOPHAGOGASTRODUODENOSCOPY N/A 04/25/2017   Procedure: ESOPHAGOGASTRODUODENOSCOPY (EGD);  Surgeon: Danie Binder, MD;  [facility];  Service: Endoscopy;  Laterality: N/A;   ESOPHAGOGASTRODUODENOSCOPY (EGD) WITH PROPOFOL N/A 05/10/2020   Procedure: ESOPHAGOGASTRODUODENOSCOPY (EGD) WITH PROPOFOL;  Surgeon: Eloise Harman, DO;  [facility];  Service: Endoscopy;   Laterality: N/A;  2:00pm   SAVORY DILATION N/A 04/25/2017   Procedure: SAVORY DILATION;  Surgeon: Danie Binder, MD;  [facility];  Service: Endoscopy;  Laterality: N/A;   tonsils removal      There were no vitals filed for this visit.   Subjective Assessment - 04/12/21 0836     Subjective Patient states knee is stationary. Home exercises are going well.    Pertinent History HTN,    How long can you sit comfortably? Able to sit for 30 minutes.    How long can you stand comfortably? 10-15    How long can you walk comfortably? PT does not do a lot of walking, just walks slow.    Patient Stated Goals less pain    Currently in Pain? No/denies    Pain Onset More than a month ago                               [facility] Adult PT Treatment/Exercise - 04/12/21 0001       Knee/Hip Exercises: Standing   Heel Raises Both;2 sets;15 reps    Heel Raises Limitations TR 2x 15    Hip Abduction Both;2 sets;15 reps    Lateral Step Up Both;10 reps;Hand Hold: 2;Step Height: 4";1 set    Forward Step Up 2  sets;10 reps;Hand Hold: 2;Step Height: 4";Both      Knee/Hip Exercises: Seated   Sit to Sand 2 sets;10 reps                     PT Education - 04/12/21 0836     Education Details HEP    Person(s) Educated Patient    Methods Explanation    Comprehension Verbalized understanding              PT Short Term Goals - 04/06/21 0946       PT SHORT TERM GOAL #1   Title PT to be I in HEP to improve knee ROM to 3 or less extension and 115 flexion for improved function.    Baseline 03/30/21 -right knee flexion to 122    Time 3    Period Weeks    Status Partially Met    Target Date 04/14/21      PT SHORT TERM GOAL #2   Title PT core and LE strength to improve to allow pt to be able to come sit to stand without UE assist from a chair.    Time 3    Period Weeks    Status On-going    Target Date 04/14/21      PT SHORT TERM GOAL #3   Title PT pain  level in her RT knee to be no greater than a 7/10 to allow pt improved activity tolerance as shown by foto score.    Baseline 9/822 - patient reports highest a 5/10 in the last week.    Time 3    Period Weeks    Status Achieved               PT Long Term Goals - 04/06/21 1005       PT LONG TERM GOAL #1   Title PT to be I in an advance HEP to allow pt to improve in functional mobility.    Time 6    Period Weeks    Status On-going      PT LONG TERM GOAL #2   Title PT core and LE strength to improve by one grade to allow pt to be able to go up and down 8 steps in a reciprocal manner using handrails.    Time 6    Period Weeks    Status On-going      PT LONG TERM GOAL #3   Title PT pain level in her Right knee to be no greater than a 5/10 to allow pt to be able to go grocery shopping in improved comfort.    Time 6    Period Weeks    Status On-going      PT LONG TERM GOAL #4   Title PT to be able to single leg stance on both legs for at least 15 seconds to demonstrate improved balance to reduce risk of falling    Time 6    Period Weeks    Status On-going                   Plan - 04/12/21 9242     Clinical Impression Statement Continued with standing strengthening exercises today. Patient able to complete increased reps of previously completed exercises. Patient showing improving strength with ability to complete STS without UE support from standard chair. Began stair training for improved quad strength but requires UE support for balance/weakness. Given intermittent cueing for mechanics throughout session with good carry over.  Patient will continue to benefit from skilled physical therapy in order to reduce impairment and improve function.    Personal Factors and Comorbidities Age;Fitness;Time since onset of injury/illness/exacerbation    Examination-Activity Limitations Bend;Carry;Stand;Stairs;Squat    Examination-Participation Restrictions Cleaning;Laundry;Meal  Prep;Community Activity    Stability/Clinical Decision Making Evolving/Moderate complexity    Rehab Potential Good    PT Frequency 2x / week    PT Duration 6 weeks    PT Treatment/Interventions Gait training;Stair training;Therapeutic exercise;Balance training;Manual techniques    PT Next Visit Plan Continue to work on improving ROM and strength followed by balance; review new HEP and sidelying hip abd.    PT Home Exercise Plan quad set, heel slide and SLR; 9/1 - LAQ, 3/8" heel lift progression 9/6 small SLR, standing hip abduction; 9/8 - STS, SAQ; 9/12 - prone hip ext    Consulted and Agree with Plan of Care Patient             Patient will benefit from skilled therapeutic intervention in order to improve the following deficits and impairments:  Abnormal gait, Decreased activity tolerance, Decreased balance, Decreased range of motion, Decreased strength, Pain, Difficulty walking  Visit Diagnosis: Stiffness of right knee, not elsewhere classified  Chronic pain of right knee     Problem List Patient Active Problem List   Diagnosis Date Noted   Bleeding internal hemorrhoids 07/06/2020   GERD (gastroesophageal reflux disease) 04/20/2020   Abdominal bloating 04/20/2020   Gastritis and gastroduodenitis    History of colonic polyps 03/11/2017   Chronic RLQ pain 03/11/2017   Abdominal pain, epigastric 03/11/2017   Dysphagia 03/11/2017   HTN (hypertension) 01/11/2017   Overweight 01/11/2017   Hyperlipidemia 01/11/2017   Allergic rhinitis 01/11/2017   Mild intermittent asthma 01/11/2017   Osteoarthritis of both knees 01/11/2017   Carpal tunnel syndrome, right 01/11/2017   Chest pain 01/11/2017   Stress 01/11/2017   Lumbago with sciatica, right side 01/11/2017    9:16 AM, 04/12/21 Mearl Latin PT, DPT Physical Therapist at [facility] [address] [place], [place], [address] Phone:  [phone]   Fax:  [phone]  Name: Saphyra Hutt MRN: [id number] Date of Birth: [birth year]

## 2021-04-13 DIAGNOSIS — M1711 Unilateral primary osteoarthritis, right knee: Secondary | ICD-10-CM | POA: Diagnosis not present

## 2021-04-17 ENCOUNTER — Encounter (HOSPITAL_COMMUNITY): Payer: Self-pay

## 2021-04-17 ENCOUNTER — Ambulatory Visit (HOSPITAL_COMMUNITY): Payer: Medicare Other

## 2021-04-17 ENCOUNTER — Other Ambulatory Visit: Payer: Self-pay

## 2021-04-17 DIAGNOSIS — G8929 Other chronic pain: Secondary | ICD-10-CM | POA: Diagnosis not present

## 2021-04-17 DIAGNOSIS — M25661 Stiffness of right knee, not elsewhere classified: Secondary | ICD-10-CM | POA: Diagnosis not present

## 2021-04-17 DIAGNOSIS — M25561 Pain in right knee: Secondary | ICD-10-CM | POA: Diagnosis not present

## 2021-04-17 NOTE — Therapy (Signed)
[facility] [address] [place], [place], [address] Phone: [phone]   Fax:  [phone]  Physical Therapy Treatment  Patient Details  Name: Jasmine Rhodes MRN: [id number] Date of Birth: 26-Nov-1945 Referring Provider (PT): Gerrit Halls   Encounter Date: 04/17/2021   PT End of Session - 04/17/21 0853     Visit Number 7    Number of Visits 12    Date for PT Re-Evaluation 05/05/21    Authorization Type [employer]    Progress Note Due on Visit 10    PT Start Time 0855    PT Stop Time 0935    PT Time Calculation (min) 40 min    Activity Tolerance Patient tolerated treatment well    Behavior During Therapy [facility] for tasks assessed/performed             Past Medical History:  Diagnosis Date   Asthma    DDD (degenerative disc disease), lumbar    Hyperlipidemia    Hypertension    Osteoarthritis    Sleep apnea     Past Surgical History:  Procedure Laterality Date   BALLOON DILATION N/A 05/10/2020   Procedure: BALLOON DILATION;  Surgeon: Eloise Harman, DO;  [facility];  Service: Endoscopy;  Laterality: N/A;   BIOPSY  05/10/2020   Procedure: BIOPSY;  Surgeon: Eloise Harman, DO;  [facility];  Service: Endoscopy;;   BREAST BIOPSY Right    BUNIONECTOMY Bilateral 2015   CARPAL TUNNEL RELEASE Right 03/30/2017   COLONOSCOPY     COLONOSCOPY N/A 04/25/2017   Procedure: COLONOSCOPY;  Surgeon: Danie Binder, MD;  [facility];  Service: Endoscopy;  Laterality: N/A;  1200   DG THUMB LEFT HAND     ESOPHAGOGASTRODUODENOSCOPY N/A 04/25/2017   Procedure: ESOPHAGOGASTRODUODENOSCOPY (EGD);  Surgeon: Danie Binder, MD;  [facility];  Service: Endoscopy;  Laterality: N/A;   ESOPHAGOGASTRODUODENOSCOPY (EGD) WITH PROPOFOL N/A 05/10/2020   Procedure: ESOPHAGOGASTRODUODENOSCOPY (EGD) WITH PROPOFOL;  Surgeon: Eloise Harman, DO;  [facility];  Service: Endoscopy;   Laterality: N/A;  2:00pm   SAVORY DILATION N/A 04/25/2017   Procedure: SAVORY DILATION;  Surgeon: Danie Binder, MD;  [facility];  Service: Endoscopy;  Laterality: N/A;   tonsils removal      There were no vitals filed for this visit.   Subjective Assessment - 04/17/21 0853     Subjective Patient reports knee pain seems to remain mostly the same. Another injection (3rd) last Thursday but no real change in  knee pain. Reports did a lot of sitting in ER with mother yesterday.    Pertinent History HTN,    How long can you sit comfortably? Able to sit for 30 minutes.    How long can you stand comfortably? 10-15    How long can you walk comfortably? PT does not do a lot of walking, just walks slow.    Patient Stated Goals less pain    Currently in Pain? Yes    Pain Location Knee    Pain Orientation Right;Anterior    Pain Descriptors / Indicators Aching;Dull    Pain Onset More than a month ago               [facility] Adult PT Treatment/Exercise - 04/17/21 0001       Knee/Hip Exercises: Stretches   Passive Hamstring Stretch Both;3 reps;30 seconds    Passive Hamstring Stretch Limitations 12  inch step    Gastroc Stretch Both;3 reps;30 seconds    Gastroc Stretch Limitations slantboard      Knee/Hip Exercises: Standing   Heel Raises Both;2 sets;15 reps    Heel Raises Limitations TR 2x 15    Lateral Step Up Both;10 reps;Hand Hold: 2;Step Height: 4";1 set    Forward Step Up 2 sets;10 reps;Hand Hold: 2;Step Height: 4";Both    Other Standing Knee Exercises tandem stance 30 sec x2 each      Knee/Hip Exercises: Seated   Sit to Sand 2 sets;10 reps                     PT Education - 04/17/21 0917     Education Details Discussed purpose and technique of skilled interventions throughout session. Advanced HEP    Person(s) Educated Patient    Methods Explanation;Handout    Comprehension Verbalized understanding              PT Short Term Goals - 04/17/21  0919       PT SHORT TERM GOAL #1   Title PT to be I in HEP to improve knee ROM to 3 or less extension and 115 flexion for improved function.    Baseline 03/30/21 -right knee flexion to 122    Time 3    Period Weeks    Status Partially Met    Target Date 04/14/21      PT SHORT TERM GOAL #2   Title PT core and LE strength to improve to allow pt to be able to come sit to stand without UE assist from a chair.    Time 3    Period Weeks    Status Achieved    Target Date 04/14/21      PT SHORT TERM GOAL #3   Title PT pain level in her RT knee to be no greater than a 7/10 to allow pt improved activity tolerance as shown by foto score.    Baseline 9/822 - patient reports highest a 5/10 in the last week.    Time 3    Period Weeks    Status Achieved               PT Long Term Goals - 04/17/21 0920       PT LONG TERM GOAL #1   Title PT to be I in an advance HEP to allow pt to improve in functional mobility.    Time 6    Period Weeks    Status On-going      PT LONG TERM GOAL #2   Title PT core and LE strength to improve by one grade to allow pt to be able to go up and down 8 steps in a reciprocal manner using handrails.    Time 6    Period Weeks    Status On-going      PT LONG TERM GOAL #3   Title PT pain level in her Right knee to be no greater than a 5/10 to allow pt to be able to go grocery shopping in improved comfort.    Time 6    Period Weeks    Status On-going      PT LONG TERM GOAL #4   Title PT to be able to single leg stance on both legs for at least 15 seconds to demonstrate improved balance to reduce risk of falling    Time 6    Period Weeks    Status On-going  Plan - 04/17/21 0854     Clinical Impression Statement Session focused on lower extremity strengthening and stretching. Added gastric and hamstring stretches to therapeutic exercises and HEP. New HEP handout issued. Continues to require UE support for balance/weakness this  session. Patient will continue to benefit from skilled physical therapy in order to reduce impairment and improve function. Added tandem stance balance to therapeutic exercises.    Personal Factors and Comorbidities Age;Fitness;Time since onset of injury/illness/exacerbation    Examination-Activity Limitations Bend;Carry;Stand;Stairs;Squat    Examination-Participation Restrictions Cleaning;Laundry;Meal Prep;Community Activity    Stability/Clinical Decision Making Evolving/Moderate complexity    Rehab Potential Good    PT Frequency 2x / week    PT Duration 6 weeks    PT Treatment/Interventions Gait training;Stair training;Therapeutic exercise;Balance training;Manual techniques    PT Next Visit Plan Continue to work on improving ROM and strength followed by balance; review new HEP and sidelying hip abd.    PT Home Exercise Plan quad set, heel slide and SLR; 9/1 - LAQ, 3/8" heel lift progression 9/6 small SLR, standing hip abduction; 9/8 - STS, SAQ; 9/12 - prone hip ext; 9/19 ham, gastroc stretch    Consulted and Agree with Plan of Care Patient             Patient will benefit from skilled therapeutic intervention in order to improve the following deficits and impairments:  Abnormal gait, Decreased activity tolerance, Decreased balance, Decreased range of motion, Decreased strength, Pain, Difficulty walking  Visit Diagnosis: Stiffness of right knee, not elsewhere classified  Chronic pain of right knee     Problem List Patient Active Problem List   Diagnosis Date Noted   Bleeding internal hemorrhoids 07/06/2020   GERD (gastroesophageal reflux disease) 04/20/2020   Abdominal bloating 04/20/2020   Gastritis and gastroduodenitis    History of colonic polyps 03/11/2017   Chronic RLQ pain 03/11/2017   Abdominal pain, epigastric 03/11/2017   Dysphagia 03/11/2017   HTN (hypertension) 01/11/2017   Overweight 01/11/2017   Hyperlipidemia 01/11/2017   Allergic rhinitis 01/11/2017   Mild  intermittent asthma 01/11/2017   Osteoarthritis of both knees 01/11/2017   Carpal tunnel syndrome, right 01/11/2017   Chest pain 01/11/2017   Stress 01/11/2017   Lumbago with sciatica, right side 01/11/2017   Floria Raveling. Hartnett-Rands, MS, PT [facility] [phone]  Jeannie Done, PT 04/17/2021, 9:32 AM  [facility] [address] [place], [place], [address] Phone: [phone]   Fax:  [phone]  Name: Judyth Demarais MRN: [id number] Date of Birth: 1946/04/17

## 2021-04-17 NOTE — Patient Instructions (Addendum)
Hamstring Stretch    Inhale and straighten spine. Exhale and lean forward toward extended leg. Hold position for ___ breaths. Inhale and come back to center. Repeat with other leg extended. Hold 30 seconds. Repeat 2-3_ times, alternating legs. Do 1 times per day.  Copyright  VHI. All rights reserved.   Hamstring Stretch    Stand with one heel on step, knee straight. Lean forward from hip. Hold 30_ seconds. Perform _3__ reps.  Copyright  VHI. All rights reserved.   Calf Stretch    Lean into wall, keeping knees locked. Hold stretch _30_ seconds. Relax. Repeat 3__ times. Do _1_ sessions per day.  http://[email or web address].exer.us/817   Copyright  VHI. All rights reserved.

## 2021-04-19 ENCOUNTER — Encounter (HOSPITAL_COMMUNITY): Payer: Medicare Other

## 2021-04-24 ENCOUNTER — Ambulatory Visit (HOSPITAL_COMMUNITY): Payer: Medicare Other | Admitting: Physical Therapy

## 2021-04-24 ENCOUNTER — Other Ambulatory Visit: Payer: Self-pay

## 2021-04-24 DIAGNOSIS — G8929 Other chronic pain: Secondary | ICD-10-CM

## 2021-04-24 DIAGNOSIS — M25561 Pain in right knee: Secondary | ICD-10-CM | POA: Diagnosis not present

## 2021-04-24 DIAGNOSIS — M25661 Stiffness of right knee, not elsewhere classified: Secondary | ICD-10-CM | POA: Diagnosis not present

## 2021-04-24 NOTE — Therapy (Signed)
[facility] [address] [place], [place], [address] Phone: [phone]   Fax:  [phone]  Physical Therapy Treatment  Patient Details  Name: Jasmine Rhodes MRN: [id number] Date of Birth: 02-21-1946 Referring Provider (PT): Gerrit Halls   Encounter Date: 04/24/2021   PT End of Session - 04/24/21 0915     Visit Number 8    Number of Visits 12    Date for PT Re-Evaluation 05/05/21    Authorization Type [employer]    Progress Note Due on Visit 10    PT Start Time [phone]    PT Stop Time 0916    PT Time Calculation (min) 40 min    Activity Tolerance Patient tolerated treatment well    Behavior During Therapy [facility] for tasks assessed/performed             Past Medical History:  Diagnosis Date   Asthma    DDD (degenerative disc disease), lumbar    Hyperlipidemia    Hypertension    Osteoarthritis    Sleep apnea     Past Surgical History:  Procedure Laterality Date   BALLOON DILATION N/A 05/10/2020   Procedure: BALLOON DILATION;  Surgeon: Eloise Harman, DO;  [facility];  Service: Endoscopy;  Laterality: N/A;   BIOPSY  05/10/2020   Procedure: BIOPSY;  Surgeon: Eloise Harman, DO;  [facility];  Service: Endoscopy;;   BREAST BIOPSY Right    BUNIONECTOMY Bilateral 2015   CARPAL TUNNEL RELEASE Right 03/30/2017   COLONOSCOPY     COLONOSCOPY N/A 04/25/2017   Procedure: COLONOSCOPY;  Surgeon: Danie Binder, MD;  [facility];  Service: Endoscopy;  Laterality: N/A;  1200   DG THUMB LEFT HAND     ESOPHAGOGASTRODUODENOSCOPY N/A 04/25/2017   Procedure: ESOPHAGOGASTRODUODENOSCOPY (EGD);  Surgeon: Danie Binder, MD;  [facility];  Service: Endoscopy;  Laterality: N/A;   ESOPHAGOGASTRODUODENOSCOPY (EGD) WITH PROPOFOL N/A 05/10/2020   Procedure: ESOPHAGOGASTRODUODENOSCOPY (EGD) WITH PROPOFOL;  Surgeon: Eloise Harman, DO;  [facility];  Service: Endoscopy;   Laterality: N/A;  2:00pm   SAVORY DILATION N/A 04/25/2017   Procedure: SAVORY DILATION;  Surgeon: Danie Binder, MD;  [facility];  Service: Endoscopy;  Laterality: N/A;   tonsils removal      There were no vitals filed for this visit.   Subjective Assessment - 04/24/21 0843     Subjective Pt states her knee is bothering her this morning at 5/10.    Currently in Pain? Yes    Pain Score 5     Pain Location Knee    Pain Orientation Right    Pain Descriptors / Indicators Aching;Dull                               [facility] Adult PT Treatment/Exercise - 04/24/21 0001       Knee/Hip Exercises: Stretches   Passive Hamstring Stretch Both;3 reps;30 seconds    Passive Hamstring Stretch Limitations 12 inch step    Gastroc Stretch Both;3 reps;30 seconds    Gastroc Stretch Limitations slantboard      Knee/Hip Exercises: Standing   Heel Raises Both;2 sets;15 reps    Heel Raises Limitations TR 2x 15    Lateral Step Up Both;Step Height: 4";1 set;15 reps;Hand Hold: 1    Forward Step Up Both;15 reps;Hand Hold: 1;Step Height: 4"  Step Down Both;15 reps;Step Height: 4";Hand Hold: 1;Hand Hold: 2    SLS with Vectors 5X5" each lLE    Other Standing Knee Exercises tandem stance 30 sec x2 each                       PT Short Term Goals - 04/17/21 0919       PT SHORT TERM GOAL #1   Title PT to be I in HEP to improve knee ROM to 3 or less extension and 115 flexion for improved function.    Baseline 03/30/21 -right knee flexion to 122    Time 3    Period Weeks    Status Partially Met    Target Date 04/14/21      PT SHORT TERM GOAL #2   Title PT core and LE strength to improve to allow pt to be able to come sit to stand without UE assist from a chair.    Time 3    Period Weeks    Status Achieved    Target Date 04/14/21      PT SHORT TERM GOAL #3   Title PT pain level in her RT knee to be no greater than a 7/10 to allow pt improved activity  tolerance as shown by foto score.    Baseline 9/822 - patient reports highest a 5/10 in the last week.    Time 3    Period Weeks    Status Achieved               PT Long Term Goals - 04/17/21 0920       PT LONG TERM GOAL #1   Title PT to be I in an advance HEP to allow pt to improve in functional mobility.    Time 6    Period Weeks    Status On-going      PT LONG TERM GOAL #2   Title PT core and LE strength to improve by one grade to allow pt to be able to go up and down 8 steps in a reciprocal manner using handrails.    Time 6    Period Weeks    Status On-going      PT LONG TERM GOAL #3   Title PT pain level in her Right knee to be no greater than a 5/10 to allow pt to be able to go grocery shopping in improved comfort.    Time 6    Period Weeks    Status On-going      PT LONG TERM GOAL #4   Title PT to be able to single leg stance on both legs for at least 15 seconds to demonstrate improved balance to reduce risk of falling    Time 6    Period Weeks    Status On-going                   Plan - 04/24/21 0916     Clinical Impression Statement Continued with established program with addition of forward step downs to increase eccentric strength.  Pt required bil UE with Rt step down due to weakness.  PT required cues for general form of most exercises but able to complete with good posturing and control.   Added vector stances to POC to work on stability of each LE.  Pt able to complete with HHA and no c/o pain.  Cues to hold a full 5 seconds in each area.  No change in  pain at end of session.    Personal Factors and Comorbidities Age;Fitness;Time since onset of injury/illness/exacerbation    Examination-Activity Limitations Bend;Carry;Stand;Stairs;Squat    Examination-Participation Restrictions Cleaning;Laundry;Meal Prep;Community Activity    Stability/Clinical Decision Making Evolving/Moderate complexity    Rehab Potential Good    PT Frequency 2x / week     PT Duration 6 weeks    PT Treatment/Interventions Gait training;Stair training;Therapeutic exercise;Balance training;Manual techniques    PT Next Visit Plan Continue to work on improving ROM and strength followed by balance.    PT Home Exercise Plan quad set, heel slide and SLR; 9/1 - LAQ, 3/8" heel lift progression 9/6 small SLR, standing hip abduction; 9/8 - STS, SAQ; 9/12 - prone hip ext; 9/19 ham, gastroc stretch    Consulted and Agree with Plan of Care Patient             Patient will benefit from skilled therapeutic intervention in order to improve the following deficits and impairments:  Abnormal gait, Decreased activity tolerance, Decreased balance, Decreased range of motion, Decreased strength, Pain, Difficulty walking  Visit Diagnosis: Stiffness of right knee, not elsewhere classified  Chronic pain of right knee     Problem List Patient Active Problem List   Diagnosis Date Noted   Bleeding internal hemorrhoids 07/06/2020   GERD (gastroesophageal reflux disease) 04/20/2020   Abdominal bloating 04/20/2020   Gastritis and gastroduodenitis    History of colonic polyps 03/11/2017   Chronic RLQ pain 03/11/2017   Abdominal pain, epigastric 03/11/2017   Dysphagia 03/11/2017   HTN (hypertension) 01/11/2017   Overweight 01/11/2017   Hyperlipidemia 01/11/2017   Allergic rhinitis 01/11/2017   Mild intermittent asthma 01/11/2017   Osteoarthritis of both knees 01/11/2017   Carpal tunnel syndrome, right 01/11/2017   Chest pain 01/11/2017   Stress 01/11/2017   Lumbago with sciatica, right side 01/11/2017   Teena Irani, PTA/CLT [phone]  Teena Irani, PTA 04/24/2021, 9:18 AM  [facility] [address] [place], [place], [address] Phone: [phone]   Fax:  [phone]  Name: Jasmine Rhodes MRN: [id number] Date of Birth: [birth year]

## 2021-04-26 ENCOUNTER — Ambulatory Visit (HOSPITAL_COMMUNITY): Payer: Medicare Other | Admitting: Physical Therapy

## 2021-05-02 ENCOUNTER — Ambulatory Visit (HOSPITAL_COMMUNITY): Payer: Medicare Other | Attending: Physician Assistant

## 2021-05-02 ENCOUNTER — Other Ambulatory Visit: Payer: Self-pay

## 2021-05-02 ENCOUNTER — Encounter (HOSPITAL_COMMUNITY): Payer: Self-pay

## 2021-05-02 DIAGNOSIS — M25661 Stiffness of right knee, not elsewhere classified: Secondary | ICD-10-CM | POA: Diagnosis not present

## 2021-05-02 DIAGNOSIS — M25561 Pain in right knee: Secondary | ICD-10-CM | POA: Diagnosis not present

## 2021-05-02 DIAGNOSIS — G8929 Other chronic pain: Secondary | ICD-10-CM | POA: Diagnosis not present

## 2021-05-02 NOTE — Therapy (Signed)
[facility] [address] [place], [place], [address] Phone: [phone]   Fax:  [phone]  Physical Therapy Treatment  Patient Details  Name: Jasmine Rhodes MRN: [id number] Date of Birth: [birth year] Referring Provider (PT): Gerrit Halls   Encounter Date: 05/02/2021   PT End of Session - 05/02/21 0844     Visit Number 9    Number of Visits 12    Date for PT Re-Evaluation 05/05/21    Authorization Type [employer]    Progress Note Due on Visit 10    PT Start Time [phone]   late arrival   PT Stop Time 0918    PT Time Calculation (min) 39 min    Activity Tolerance Patient tolerated treatment well    Behavior During Therapy [facility] for tasks assessed/performed             Past Medical History:  Diagnosis Date   Asthma    DDD (degenerative disc disease), lumbar    Hyperlipidemia    Hypertension    Osteoarthritis    Sleep apnea     Past Surgical History:  Procedure Laterality Date   BALLOON DILATION N/A 05/10/2020   Procedure: BALLOON DILATION;  Surgeon: Eloise Harman, DO;  [facility];  Service: Endoscopy;  Laterality: N/A;   BIOPSY  05/10/2020   Procedure: BIOPSY;  Surgeon: Eloise Harman, DO;  [facility];  Service: Endoscopy;;   BREAST BIOPSY Right    BUNIONECTOMY Bilateral 2015   CARPAL TUNNEL RELEASE Right 03/30/2017   COLONOSCOPY     COLONOSCOPY N/A 04/25/2017   Procedure: COLONOSCOPY;  Surgeon: Danie Binder, MD;  [facility];  Service: Endoscopy;  Laterality: N/A;  1200   DG THUMB LEFT HAND     ESOPHAGOGASTRODUODENOSCOPY N/A 04/25/2017   Procedure: ESOPHAGOGASTRODUODENOSCOPY (EGD);  Surgeon: Danie Binder, MD;  [facility];  Service: Endoscopy;  Laterality: N/A;   ESOPHAGOGASTRODUODENOSCOPY (EGD) WITH PROPOFOL N/A 05/10/2020   Procedure: ESOPHAGOGASTRODUODENOSCOPY (EGD) WITH PROPOFOL;  Surgeon: Eloise Harman, DO;  [facility];   Service: Endoscopy;  Laterality: N/A;  2:00pm   SAVORY DILATION N/A 04/25/2017   Procedure: SAVORY DILATION;  Surgeon: Danie Binder, MD;  [facility];  Service: Endoscopy;  Laterality: N/A;   tonsils removal      There were no vitals filed for this visit.   Subjective Assessment - 05/02/21 0842     Subjective Pt reports increased pain during the rain, feeling better today.  Reports she has been unable to complete HEP as much as she would like due to staying with mother in hospital.    Pertinent History HTN,    Patient Stated Goals less pain    Currently in Pain? Yes    Pain Score 1     Pain Location Knee    Pain Orientation Right    Pain Descriptors / Indicators Aching;Dull    Pain Type Chronic pain    Pain Onset More than a month ago    Pain Frequency Intermittent    Aggravating Factors  standing, steps and squating    Pain Relieving Factors meds                [facility] PT Assessment - 05/02/21 0001       Assessment   Medical Diagnosis Rt knee pain    Referring Provider (PT) Gerrit Halls    Onset Date/Surgical Date 08/13/20  Next MD Visit 2 weeks, unsure date                           [facility] Adult PT Treatment/Exercise - 05/02/21 0001       Knee/Hip Exercises: Standing   Heel Raises 15 reps;5 seconds    Heel Raises Limitations Toe raise 15x    Terminal Knee Extension Right;10 reps;Theraband   5" holds   Theraband Level (Terminal Knee Extension) Level 2 (Red)    Terminal Knee Extension Limitations 5" holds    Forward Step Up Both;15 reps;Hand Hold: 1;Step Height: 4"    Step Down Both;15 reps;Step Height: 4";Hand Hold: 1;Hand Hold: 2    Functional Squat 10 reps    Functional Squat Limitations front of chair, good cueing following initial instructions    SLS LT 19"; Lt 11"    SLS with Vectors 5X5" BLE    Other Standing Knee Exercises sidestep RTB down long hallway with RTB around                       PT Short Term  Goals - 04/17/21 0919       PT SHORT TERM GOAL #1   Title PT to be I in HEP to improve knee ROM to 3 or less extension and 115 flexion for improved function.    Baseline 03/30/21 -right knee flexion to 122    Time 3    Period Weeks    Status Partially Met    Target Date 04/14/21      PT SHORT TERM GOAL #2   Title PT core and LE strength to improve to allow pt to be able to come sit to stand without UE assist from a chair.    Time 3    Period Weeks    Status Achieved    Target Date 04/14/21      PT SHORT TERM GOAL #3   Title PT pain level in her RT knee to be no greater than a 7/10 to allow pt improved activity tolerance as shown by foto score.    Baseline 9/822 - patient reports highest a 5/10 in the last week.    Time 3    Period Weeks    Status Achieved               PT Long Term Goals - 04/17/21 0920       PT LONG TERM GOAL #1   Title PT to be I in an advance HEP to allow pt to improve in functional mobility.    Time 6    Period Weeks    Status On-going      PT LONG TERM GOAL #2   Title PT core and LE strength to improve by one grade to allow pt to be able to go up and down 8 steps in a reciprocal manner using handrails.    Time 6    Period Weeks    Status On-going      PT LONG TERM GOAL #3   Title PT pain level in her Right knee to be no greater than a 5/10 to allow pt to be able to go grocery shopping in improved comfort.    Time 6    Period Weeks    Status On-going      PT LONG TERM GOAL #4   Title PT to be able to single leg stance on both legs for at  least 15 seconds to demonstrate improved balance to reduce risk of falling    Time 6    Period Weeks    Status On-going                   Plan - 05/02/21 0915     Clinical Impression Statement Added squats, sidestep and TKE to address extension lag and hip strengthening.  Pt able to complete all exercises with good form following initial cueing.  No reports of pain through session.     Personal Factors and Comorbidities Age;Fitness;Time since onset of injury/illness/exacerbation    Examination-Activity Limitations Bend;Carry;Stand;Stairs;Squat    Examination-Participation Restrictions Cleaning;Laundry;Meal Prep;Community Activity    Stability/Clinical Decision Making Evolving/Moderate complexity    Clinical Decision Making Moderate    Rehab Potential Good    PT Frequency 2x / week    PT Duration 6 weeks    PT Treatment/Interventions Gait training;Stair training;Therapeutic exercise;Balance training;Manual techniques    PT Next Visit Plan 10th visit progress note.  Continue to address knee extension, strength and balance.    PT Home Exercise Plan quad set, heel slide and SLR; 9/1 - LAQ, 3/8" heel lift progression 9/6 small SLR, standing hip abduction; 9/8 - STS, SAQ; 9/12 - prone hip ext; 9/19 ham, gastroc stretch; 10/4: TKE and sidestep with RTB             Patient will benefit from skilled therapeutic intervention in order to improve the following deficits and impairments:  Abnormal gait, Decreased activity tolerance, Decreased balance, Decreased range of motion, Decreased strength, Pain, Difficulty walking  Visit Diagnosis: Stiffness of right knee, not elsewhere classified  Chronic pain of right knee     Problem List Patient Active Problem List   Diagnosis Date Noted   Bleeding internal hemorrhoids 07/06/2020   GERD (gastroesophageal reflux disease) 04/20/2020   Abdominal bloating 04/20/2020   Gastritis and gastroduodenitis    History of colonic polyps 03/11/2017   Chronic RLQ pain 03/11/2017   Abdominal pain, epigastric 03/11/2017   Dysphagia 03/11/2017   HTN (hypertension) 01/11/2017   Overweight 01/11/2017   Hyperlipidemia 01/11/2017   Allergic rhinitis 01/11/2017   Mild intermittent asthma 01/11/2017   Osteoarthritis of both knees 01/11/2017   Carpal tunnel syndrome, right 01/11/2017   Chest pain 01/11/2017   Stress 01/11/2017   Lumbago with  sciatica, right side 01/11/2017   Ihor Austin, LPTA/CLT; CBIS [phone]  Aldona Lento, PTA 05/02/2021, 9:22 AM  [facility] [address] [place], [place], [address] Phone: [phone]   Fax:  [phone]  Name: Jasmine Rhodes MRN: [id number] Date of Birth: 12/16/1945

## 2021-05-04 ENCOUNTER — Encounter (HOSPITAL_COMMUNITY): Payer: Self-pay | Admitting: Physical Therapy

## 2021-05-04 ENCOUNTER — Other Ambulatory Visit: Payer: Self-pay

## 2021-05-04 ENCOUNTER — Ambulatory Visit (HOSPITAL_COMMUNITY): Payer: Medicare Other | Admitting: Physical Therapy

## 2021-05-04 DIAGNOSIS — M25661 Stiffness of right knee, not elsewhere classified: Secondary | ICD-10-CM

## 2021-05-04 DIAGNOSIS — G8929 Other chronic pain: Secondary | ICD-10-CM | POA: Diagnosis not present

## 2021-05-04 DIAGNOSIS — M25561 Pain in right knee: Secondary | ICD-10-CM | POA: Diagnosis not present

## 2021-05-04 NOTE — Patient Instructions (Signed)
Access Code: [email or web address] URL: [email or web address] Date: 05/04/2021 Prepared by: Greig Castilla Mckenzey Parcell  Exercises Step Up (Mirrored) - 1 x daily - 7 x weekly - 2 sets - 10 reps

## 2021-05-04 NOTE — Therapy (Signed)
[facility] [address] [place], [place], [address] Phone: [phone]   Fax:  [phone]  Physical Therapy Treatment/Discharge Summary  Patient Details  Name: Jasmine Rhodes MRN: [id number] Date of Birth: 11/30/1945 Referring Provider (PT): Gerrit Halls   Encounter Date: 05/04/2021  PHYSICAL THERAPY DISCHARGE SUMMARY  Visits from Start of Care: 10  Current functional level related to goals / functional outcomes: See below   Remaining deficits: See below   Education / Equipment: See below   Patient agrees to discharge. Patient goals were met. Patient is being discharged due to meeting the stated rehab goals.  Progress Note   Reporting Period 03/24/21 to 05/04/21   See note below for Objective Data and Assessment of Progress/Goals     PT End of Session - 05/04/21 0800     Visit Number 10    Number of Visits 12    Date for PT Re-Evaluation 05/05/21    Authorization Type [employer]    Progress Note Due on Visit 10    PT Start Time 0800   arrive late/delayed check in   PT Stop Time 0825    PT Time Calculation (min) 25 min    Activity Tolerance Patient tolerated treatment well    Behavior During Therapy WFL for tasks assessed/performed             Past Medical History:  Diagnosis Date   Asthma    DDD (degenerative disc disease), lumbar    Hyperlipidemia    Hypertension    Osteoarthritis    Sleep apnea     Past Surgical History:  Procedure Laterality Date   BALLOON DILATION N/A 05/10/2020   Procedure: BALLOON DILATION;  Surgeon: Eloise Harman, DO;  [facility];  Service: Endoscopy;  Laterality: N/A;   BIOPSY  05/10/2020   Procedure: BIOPSY;  Surgeon: Eloise Harman, DO;  [facility];  Service: Endoscopy;;   BREAST BIOPSY Right    BUNIONECTOMY Bilateral 2015   CARPAL TUNNEL RELEASE Right 03/30/2017   COLONOSCOPY     COLONOSCOPY N/A 04/25/2017   Procedure:  COLONOSCOPY;  Surgeon: Danie Binder, MD;  [facility];  Service: Endoscopy;  Laterality: N/A;  1200   DG THUMB LEFT HAND     ESOPHAGOGASTRODUODENOSCOPY N/A 04/25/2017   Procedure: ESOPHAGOGASTRODUODENOSCOPY (EGD);  Surgeon: Danie Binder, MD;  [facility];  Service: Endoscopy;  Laterality: N/A;   ESOPHAGOGASTRODUODENOSCOPY (EGD) WITH PROPOFOL N/A 05/10/2020   Procedure: ESOPHAGOGASTRODUODENOSCOPY (EGD) WITH PROPOFOL;  Surgeon: Eloise Harman, DO;  [facility];  Service: Endoscopy;  Laterality: N/A;  2:00pm   SAVORY DILATION N/A 04/25/2017   Procedure: SAVORY DILATION;  Surgeon: Danie Binder, MD;  [facility];  Service: Endoscopy;  Laterality: N/A;   tonsils removal      There were no vitals filed for this visit.   Subjective Assessment - 05/04/21 0801     Subjective Patient states knee has still been bothering her. Her home exercises have been going well. Patient states 75-80% improvement with PT intervention. She feels limited by continued knee buckling. Patient states pain worst 5/10 with bending/walking. Patient feels ready to complete HEP.    Pertinent History HTN,    Patient Stated Goals less pain    Currently in Pain? Yes    Pain Score 1     Pain Location Knee    Pain Orientation Right    Pain Descriptors /  Indicators Dull    Pain Type Chronic pain    Pain Onset More than a month ago    Pain Frequency Intermittent                [facility] PT Assessment - 05/04/21 0001       Assessment   Medical Diagnosis Rt knee pain    Referring Provider (PT) Gerrit Halls    Onset Date/Surgical Date 08/13/20    Prior Therapy none      Precautions   Precautions None      Balance Screen   Has the patient fallen in the past 6 months No      [place] residence      Cognition   Overall Cognitive Status Within Functional Limits for tasks assessed      AROM   Right Knee Extension 5    lacking   Right Knee Flexion 116      Strength   Right Hip Flexion 4+/5    Right Hip Extension 2+/5    Right Hip ABduction 4+/5    Left Hip Flexion 4+/5    Left Hip Extension 2+/5    Left Hip ABduction 5/5    Right Knee Flexion 4+/5    Right Knee Extension 5/5    Left Knee Flexion 5/5    Left Knee Extension 5/5    Right Ankle Dorsiflexion 5/5    Left Ankle Dorsiflexion 5/5      Ambulation/Gait   Gait Comments stairs, able to navigate with alternating pattern with UE support and compensation R>L      Balance   Balance Assessed --   SLS RLE 15 seconds LLE 11 seconds                                   PT Education - 05/04/21 0801     Education Details HEP, reassessment findings, returning to PT if needed    Person(s) Educated Patient    Methods Explanation;Demonstration;Handout    Comprehension Verbalized understanding;Returned demonstration              PT Short Term Goals - 04/17/21 0919       PT SHORT TERM GOAL #1   Title PT to be I in HEP to improve knee ROM to 3 or less extension and 115 flexion for improved function.    Baseline 03/30/21 -right knee flexion to 122    Time 3    Period Weeks    Status Partially Met    Target Date 04/14/21      PT SHORT TERM GOAL #2   Title PT core and LE strength to improve to allow pt to be able to come sit to stand without UE assist from a chair.    Time 3    Period Weeks    Status Achieved    Target Date 04/14/21      PT SHORT TERM GOAL #3   Title PT pain level in her RT knee to be no greater than a 7/10 to allow pt improved activity tolerance as shown by foto score.    Baseline 9/822 - patient reports highest a 5/10 in the last week.    Time 3    Period Weeks    Status Achieved               PT Long Term Goals - 05/04/21 7793  PT LONG TERM GOAL #1   Title PT to be I in an advance HEP to allow pt to improve in functional mobility.    Time 6    Period Weeks    Status Achieved       PT LONG TERM GOAL #2   Title PT core and LE strength to improve by one grade to allow pt to be able to go up and down 8 steps in a reciprocal manner using handrails.    Time 6    Period Weeks    Status Partially Met      PT LONG TERM GOAL #3   Title PT pain level in her Right knee to be no greater than a 5/10 to allow pt to be able to go grocery shopping in improved comfort.    Time 6    Period Weeks    Status Achieved      PT LONG TERM GOAL #4   Title PT to be able to single leg stance on both legs for at least 15 seconds to demonstrate improved balance to reduce risk of falling    Time 6    Period Weeks    Status Partially Met                   Plan - 05/04/21 0801     Clinical Impression Statement Patient has met all short and long term goals with some goals partially met. Goals met with ability to complete HEP and improvement in symptoms, strength, functional mobility, balance and activity tolerance. Patient continues to remain limited with static balance, knee extension ROM, functional strength. Patient has made great progress since beginning therapy especially with LE strength. Patient educated on returning to PT if needed. Patient discharged from physical therapy at this time.    Personal Factors and Comorbidities Age;Fitness;Time since onset of injury/illness/exacerbation    Examination-Activity Limitations Bend;Carry;Stand;Stairs;Squat    Examination-Participation Restrictions Cleaning;Laundry;Meal Prep;Community Activity    Stability/Clinical Decision Making Evolving/Moderate complexity    Rehab Potential Good    PT Frequency --    PT Duration --    PT Treatment/Interventions Gait training;Stair training;Therapeutic exercise;Balance training;Manual techniques    PT Next Visit Plan n/a    PT Home Exercise Plan quad set, heel slide and SLR; 9/1 - LAQ, 3/8" heel lift progression 9/6 small SLR, standing hip abduction; 9/8 - STS, SAQ; 9/12 - prone hip ext; 9/19  ham, gastroc stretch; 10/4: TKE and sidestep with RTB 10/6 step up             Patient will benefit from skilled therapeutic intervention in order to improve the following deficits and impairments:  Abnormal gait, Decreased activity tolerance, Decreased balance, Decreased range of motion, Decreased strength, Pain, Difficulty walking  Visit Diagnosis: Stiffness of right knee, not elsewhere classified  Chronic pain of right knee     Problem List Patient Active Problem List   Diagnosis Date Noted   Bleeding internal hemorrhoids 07/06/2020   GERD (gastroesophageal reflux disease) 04/20/2020   Abdominal bloating 04/20/2020   Gastritis and gastroduodenitis    History of colonic polyps 03/11/2017   Chronic RLQ pain 03/11/2017   Abdominal pain, epigastric 03/11/2017   Dysphagia 03/11/2017   HTN (hypertension) 01/11/2017   Overweight 01/11/2017   Hyperlipidemia 01/11/2017   Allergic rhinitis 01/11/2017   Mild intermittent asthma 01/11/2017   Osteoarthritis of both knees 01/11/2017   Carpal tunnel syndrome, right 01/11/2017   Chest pain 01/11/2017   Stress 01/11/2017  Lumbago with sciatica, right side 01/11/2017    8:32 AM, 05/04/21 Mearl Latin PT, DPT Physical Therapist at [facility] [place], [place], [address] Phone: [phone]   Fax:  [phone]  Name: Jasmine Rhodes MRN: [id number] Date of Birth: Mar 19, 1946

## 2021-05-10 ENCOUNTER — Encounter (HOSPITAL_COMMUNITY): Payer: Medicare Other | Admitting: Physical Therapy

## 2021-05-26 DIAGNOSIS — L700 Acne vulgaris: Secondary | ICD-10-CM | POA: Diagnosis not present

## 2021-05-26 DIAGNOSIS — L814 Other melanin hyperpigmentation: Secondary | ICD-10-CM | POA: Diagnosis not present

## 2021-05-26 DIAGNOSIS — M25561 Pain in right knee: Secondary | ICD-10-CM | POA: Diagnosis not present

## 2021-05-26 DIAGNOSIS — L811 Chloasma: Secondary | ICD-10-CM | POA: Diagnosis not present

## 2021-05-26 DIAGNOSIS — Z23 Encounter for immunization: Secondary | ICD-10-CM | POA: Diagnosis not present

## 2021-05-26 DIAGNOSIS — L821 Other seborrheic keratosis: Secondary | ICD-10-CM | POA: Diagnosis not present

## 2021-05-31 DIAGNOSIS — M62830 Muscle spasm of back: Secondary | ICD-10-CM | POA: Diagnosis not present

## 2021-05-31 DIAGNOSIS — M179 Osteoarthritis of knee, unspecified: Secondary | ICD-10-CM | POA: Diagnosis not present

## 2021-05-31 DIAGNOSIS — J309 Allergic rhinitis, unspecified: Secondary | ICD-10-CM | POA: Diagnosis not present

## 2021-05-31 DIAGNOSIS — M25511 Pain in right shoulder: Secondary | ICD-10-CM | POA: Diagnosis not present

## 2021-05-31 DIAGNOSIS — M25512 Pain in left shoulder: Secondary | ICD-10-CM | POA: Diagnosis not present

## 2021-05-31 DIAGNOSIS — H04123 Dry eye syndrome of bilateral lacrimal glands: Secondary | ICD-10-CM | POA: Diagnosis not present

## 2021-06-12 DIAGNOSIS — I10 Essential (primary) hypertension: Secondary | ICD-10-CM | POA: Diagnosis not present

## 2021-06-12 DIAGNOSIS — Z23 Encounter for immunization: Secondary | ICD-10-CM | POA: Diagnosis not present

## 2021-06-12 DIAGNOSIS — R7301 Impaired fasting glucose: Secondary | ICD-10-CM | POA: Diagnosis not present

## 2021-06-15 ENCOUNTER — Other Ambulatory Visit (HOSPITAL_COMMUNITY): Payer: Self-pay | Admitting: Family Medicine

## 2021-06-15 ENCOUNTER — Other Ambulatory Visit: Payer: Self-pay | Admitting: Family Medicine

## 2021-06-15 DIAGNOSIS — M79602 Pain in left arm: Secondary | ICD-10-CM

## 2021-06-15 DIAGNOSIS — F17200 Nicotine dependence, unspecified, uncomplicated: Secondary | ICD-10-CM

## 2021-06-15 DIAGNOSIS — E782 Mixed hyperlipidemia: Secondary | ICD-10-CM | POA: Diagnosis not present

## 2021-06-15 DIAGNOSIS — M25561 Pain in right knee: Secondary | ICD-10-CM | POA: Diagnosis not present

## 2021-06-15 DIAGNOSIS — R7301 Impaired fasting glucose: Secondary | ICD-10-CM | POA: Diagnosis not present

## 2021-06-15 DIAGNOSIS — I10 Essential (primary) hypertension: Secondary | ICD-10-CM | POA: Diagnosis not present

## 2021-06-15 DIAGNOSIS — J302 Other seasonal allergic rhinitis: Secondary | ICD-10-CM | POA: Diagnosis not present

## 2021-06-15 DIAGNOSIS — R600 Localized edema: Secondary | ICD-10-CM | POA: Diagnosis not present

## 2021-07-25 ENCOUNTER — Ambulatory Visit (HOSPITAL_COMMUNITY)
Admission: RE | Admit: 2021-07-25 | Discharge: 2021-07-25 | Disposition: A | Discharge status: Home / Self Care | Payer: Medicare Other | Source: Ambulatory Visit | Attending: Family Medicine | Admitting: Family Medicine

## 2021-07-25 ENCOUNTER — Other Ambulatory Visit: Payer: Self-pay

## 2021-07-25 DIAGNOSIS — I2510 Atherosclerotic heart disease of native coronary artery without angina pectoris: Secondary | ICD-10-CM | POA: Insufficient documentation

## 2021-07-25 DIAGNOSIS — I700 Atherosclerosis of aorta: Secondary | ICD-10-CM | POA: Diagnosis not present

## 2021-07-25 DIAGNOSIS — J439 Emphysema, unspecified: Secondary | ICD-10-CM | POA: Diagnosis not present

## 2021-07-25 DIAGNOSIS — F17200 Nicotine dependence, unspecified, uncomplicated: Secondary | ICD-10-CM

## 2021-07-25 DIAGNOSIS — M79602 Pain in left arm: Secondary | ICD-10-CM

## 2021-07-25 DIAGNOSIS — Z122 Encounter for screening for malignant neoplasm of respiratory organs: Secondary | ICD-10-CM | POA: Diagnosis not present

## 2021-07-25 DIAGNOSIS — F17210 Nicotine dependence, cigarettes, uncomplicated: Secondary | ICD-10-CM | POA: Insufficient documentation

## 2021-08-08 DIAGNOSIS — U071 COVID-19: Secondary | ICD-10-CM | POA: Diagnosis not present

## 2021-09-12 DIAGNOSIS — F17200 Nicotine dependence, unspecified, uncomplicated: Secondary | ICD-10-CM | POA: Diagnosis not present

## 2021-09-12 DIAGNOSIS — N3281 Overactive bladder: Secondary | ICD-10-CM | POA: Diagnosis not present

## 2021-12-12 DIAGNOSIS — R7301 Impaired fasting glucose: Secondary | ICD-10-CM | POA: Diagnosis not present

## 2021-12-12 DIAGNOSIS — I10 Essential (primary) hypertension: Secondary | ICD-10-CM | POA: Diagnosis not present

## 2021-12-12 DIAGNOSIS — E785 Hyperlipidemia, unspecified: Secondary | ICD-10-CM | POA: Diagnosis not present

## 2021-12-19 ENCOUNTER — Other Ambulatory Visit (HOSPITAL_COMMUNITY): Payer: Self-pay | Admitting: Family Medicine

## 2021-12-19 DIAGNOSIS — R600 Localized edema: Secondary | ICD-10-CM | POA: Diagnosis not present

## 2021-12-19 DIAGNOSIS — Z13820 Encounter for screening for osteoporosis: Secondary | ICD-10-CM | POA: Diagnosis not present

## 2021-12-19 DIAGNOSIS — F17200 Nicotine dependence, unspecified, uncomplicated: Secondary | ICD-10-CM | POA: Diagnosis not present

## 2021-12-19 DIAGNOSIS — M25561 Pain in right knee: Secondary | ICD-10-CM | POA: Diagnosis not present

## 2021-12-19 DIAGNOSIS — Z1211 Encounter for screening for malignant neoplasm of colon: Secondary | ICD-10-CM | POA: Diagnosis not present

## 2021-12-19 DIAGNOSIS — Z0001 Encounter for general adult medical examination with abnormal findings: Secondary | ICD-10-CM | POA: Diagnosis not present

## 2021-12-19 DIAGNOSIS — M79602 Pain in left arm: Secondary | ICD-10-CM | POA: Diagnosis not present

## 2021-12-19 DIAGNOSIS — I10 Essential (primary) hypertension: Secondary | ICD-10-CM | POA: Diagnosis not present

## 2021-12-26 ENCOUNTER — Ambulatory Visit (HOSPITAL_COMMUNITY)
Admission: RE | Admit: 2021-12-26 | Discharge: 2021-12-26 | Disposition: A | Discharge status: Home / Self Care | Payer: Medicare Other | Source: Ambulatory Visit | Attending: Family Medicine | Admitting: Family Medicine

## 2021-12-26 DIAGNOSIS — Z13820 Encounter for screening for osteoporosis: Secondary | ICD-10-CM | POA: Diagnosis not present

## 2021-12-26 DIAGNOSIS — Z780 Asymptomatic menopausal state: Secondary | ICD-10-CM | POA: Insufficient documentation

## 2021-12-26 DIAGNOSIS — M85852 Other specified disorders of bone density and structure, left thigh: Secondary | ICD-10-CM | POA: Diagnosis not present

## 2021-12-26 DIAGNOSIS — F17200 Nicotine dependence, unspecified, uncomplicated: Secondary | ICD-10-CM | POA: Insufficient documentation

## 2021-12-26 DIAGNOSIS — M8588 Other specified disorders of bone density and structure, other site: Secondary | ICD-10-CM | POA: Diagnosis not present

## 2022-02-05 ENCOUNTER — Other Ambulatory Visit: Payer: Self-pay | Admitting: Internal Medicine

## 2022-02-05 DIAGNOSIS — Z1231 Encounter for screening mammogram for malignant neoplasm of breast: Secondary | ICD-10-CM

## 2022-02-06 ENCOUNTER — Ambulatory Visit
Admission: RE | Admit: 2022-02-06 | Discharge: 2022-02-06 | Disposition: A | Discharge status: Home / Self Care | Payer: Medicare Other | Source: Ambulatory Visit | Attending: Internal Medicine | Admitting: Internal Medicine

## 2022-02-06 DIAGNOSIS — Z1231 Encounter for screening mammogram for malignant neoplasm of breast: Secondary | ICD-10-CM | POA: Diagnosis not present

## 2022-03-08 DIAGNOSIS — M25512 Pain in left shoulder: Secondary | ICD-10-CM | POA: Diagnosis not present

## 2022-03-08 DIAGNOSIS — M5412 Radiculopathy, cervical region: Secondary | ICD-10-CM | POA: Diagnosis not present

## 2022-03-26 ENCOUNTER — Emergency Department (HOSPITAL_COMMUNITY): Payer: Medicare Other

## 2022-03-26 ENCOUNTER — Emergency Department (HOSPITAL_COMMUNITY)
Admission: EM | Admit: 2022-03-26 | Discharge: 2022-03-26 | Disposition: A | Discharge status: Home / Self Care | Payer: Medicare Other | Attending: Emergency Medicine | Admitting: Emergency Medicine

## 2022-03-26 ENCOUNTER — Encounter (HOSPITAL_COMMUNITY): Payer: Self-pay

## 2022-03-26 ENCOUNTER — Other Ambulatory Visit: Payer: Self-pay

## 2022-03-26 DIAGNOSIS — I10 Essential (primary) hypertension: Secondary | ICD-10-CM | POA: Insufficient documentation

## 2022-03-26 DIAGNOSIS — M7989 Other specified soft tissue disorders: Secondary | ICD-10-CM | POA: Diagnosis not present

## 2022-03-26 DIAGNOSIS — E785 Hyperlipidemia, unspecified: Secondary | ICD-10-CM | POA: Insufficient documentation

## 2022-03-26 DIAGNOSIS — Z7982 Long term (current) use of aspirin: Secondary | ICD-10-CM | POA: Insufficient documentation

## 2022-03-26 DIAGNOSIS — L03116 Cellulitis of left lower limb: Secondary | ICD-10-CM | POA: Insufficient documentation

## 2022-03-26 DIAGNOSIS — M19072 Primary osteoarthritis, left ankle and foot: Secondary | ICD-10-CM | POA: Diagnosis not present

## 2022-03-26 DIAGNOSIS — M25572 Pain in left ankle and joints of left foot: Secondary | ICD-10-CM | POA: Diagnosis not present

## 2022-03-26 LAB — CBG MONITORING, ED: Glucose-Capillary: 126 mg/dL — ABNORMAL HIGH (ref 70–99)

## 2022-03-26 MED ORDER — DOXYCYCLINE HYCLATE 100 MG PO CAPS: 100.0000 mg | ORAL_CAPSULE | Freq: Two times a day (BID) | ORAL | 0 refills | Status: DC

## 2022-03-26 MED ORDER — DOXYCYCLINE HYCLATE 100 MG PO TABS
100.0000 mg | ORAL_TABLET | Freq: Once | ORAL | Status: AC
  Administered 2022-03-26: 100 mg via ORAL
  Filled 2022-03-26: qty 1

## 2022-03-26 NOTE — Discharge Instructions (Signed)
You are being prescribed antibiotics for suspected skin infection related to probable insect bite.  Take your next dose of the doxycycline tomorrow morning.  Elevation and warm compresses may also help with swelling and discomfort.

## 2022-03-26 NOTE — ED Triage Notes (Addendum)
Patient states that she believes she was bitten by some sort of bugs to left ankle causing pain and swelling. Patient recently on airplane for travel. States that she did not actually see bugs but area was itching.

## 2022-03-26 NOTE — ED Provider Notes (Signed)
[facility] EMERGENCY DEPARTMENT Provider Note   CSN: [id number] Arrival date & time: 03/26/22  1649     History  Chief Complaint  Patient presents with   Ankle Pain    Jasmine Rhodes is a 76 y.o. female with a history including hypertension, hyperlipidemia, osteoarthritis and GERD presenting for evaluation of suspected infected insect bite.  She states that she visited her daughter in [place] who has several dogs, while she was there she felt she was bit on her ankle by an insect as she had itching at the site and noticed a small white bump which has since resolved but now she has redness and tenderness of her ankle region.  She states the dogs are well cared for, she did not think they had fleas, she did spend some time out doors there, but to her knowledge was not bit by any mosquitoes, no tick bites.  She is concerned about spreading redness and tenderness of the skin around the site.  She denies fevers or chills or other complaints.  She has had no treatment prior to arrival.  The history is provided by the patient.       Home Medications Prior to Admission medications   Medication Sig Start Date End Date Taking? Authorizing Provider  doxycycline (VIBRAMYCIN) 100 MG capsule Take 1 capsule (100 mg total) by mouth 2 (two) times daily. 03/26/22  Yes Markeita Alicia, Raynelle Fanning, PA-C  acetaminophen (TYLENOL) 500 MG tablet Take 1,000 mg by mouth every 6 (six) hours as needed for moderate pain or headache.    [provider]  albuterol (VENTOLIN HFA) 108 (90 Base) MCG/ACT inhaler Inhale 2 puffs into the lungs every 6 (six) hours as needed for wheezing or shortness of breath.    [provider]  Ascorbic Acid (VITAMIN C) 1000 MG tablet Take 1,000 mg by mouth daily.    [provider]  aspirin EC 81 MG tablet Take 81 mg by mouth daily.    [provider]  atenolol (TENORMIN) 100 MG tablet Take 50 mg by mouth daily.    [provider]  b complex vitamins  capsule Take 1 capsule by mouth once a week.    [provider]  bismuth subsalicylate (PEPTO BISMOL) 262 MG/15ML suspension Take 30 mLs by mouth every 6 (six) hours as needed for diarrhea or loose stools.    [provider]  cetirizine (ZYRTEC) 10 MG tablet Take 10 mg by mouth daily as needed for allergies.     [provider]  cholecalciferol (VITAMIN D3) 25 MCG (1000 UNIT) tablet Take 1,000 Units by mouth daily.    [provider]  diclofenac sodium (VOLTAREN) 1 % GEL Apply 2 g topically daily as needed (for knee pain).  03/19/18   [provider]  diclofenac Sodium (VOLTAREN) 1 % GEL Apply 2 g topically 4 (four) times daily. 11/27/20   Jeannie Fend, PA-C  disulfiram (ANTABUSE) 250 MG tablet Take 250 mg by mouth as needed (alcohol relapse).  11/16/19   [provider]  fluticasone (FLONASE) 50 MCG/ACT nasal spray Place 1 spray into both nostrils as needed for allergies.     [provider]  hydrocortisone cream 1 % Apply 1 application topically daily as needed for itching.     [provider]  Multiple Vitamin (MULTIVITAMIN WITH MINERALS) TABS tablet Take 1 tablet by mouth daily.    [provider]  omeprazole (PRILOSEC) 40 MG capsule Take 1 capsule (40 mg total) by  mouth daily. 07/06/20 07/06/21  Lanelle Bal, DO  Polyethyl Glycol-Propyl Glycol (SYSTANE ULTRA OP) Apply 1 drop to eye 2 (two) times daily as needed (dry eye relief).     [provider]  potassium citrate (UROCIT-K) 10 MEQ (1080 MG) SR tablet Take 10 mEq by mouth daily.    [provider]  pravastatin (PRAVACHOL) 20 MG tablet Take 20 mg by mouth daily.    [provider]  triamterene-hydrochlorothiazide (DYAZIDE) 37.5-25 MG capsule Take 1 capsule by mouth daily.    [provider]      Allergies    Sulfa antibiotics    Review of Systems   Review of Systems  Constitutional:  Negative for fever.  HENT:  Negative  for congestion and sore throat.   Eyes: Negative.   Respiratory:  Negative for chest tightness and shortness of breath.   Cardiovascular:  Negative for chest pain.  Gastrointestinal:  Negative for abdominal pain and nausea.  Genitourinary: Negative.   Musculoskeletal:  Negative for arthralgias, joint swelling and neck pain.  Skin:  Positive for color change. Negative for rash and wound.  Neurological:  Negative for dizziness, weakness, light-headedness, numbness and headaches.  Psychiatric/Behavioral: Negative.      Physical Exam Updated Vital Signs BP 122/84 (BP Location: Left Arm)   Pulse 83   Temp 98.6 F (37 C) (Oral)   Resp 18   SpO2 100%  Physical Exam Constitutional:      Appearance: She is well-developed.  HENT:     Head: Atraumatic.  Cardiovascular:     Comments: Pulses equal bilaterally Musculoskeletal:        General: Swelling present.     Cervical back: Normal range of motion.     Left ankle: Swelling present. Normal pulse.     Left Achilles Tendon: Normal.  Skin:    General: Skin is warm and dry.     Findings: Erythema present.     Comments: Small slightly tender punctum left lateral ankle at suspected site of insect bite.  Edema and erythema extending to proximal lateral malleolus and proximal lateral foot.  No red streaking, no induration or fluctuance.  Neurological:     Mental Status: She is alert.     Sensory: No sensory deficit.     Motor: No weakness.     Deep Tendon Reflexes: Reflexes normal.     ED Results / Procedures / Treatments   Labs (all labs ordered are listed, but only abnormal results are displayed) Labs Reviewed  CBG MONITORING, ED - Abnormal; Notable for the following components:      Result Value   Glucose-Capillary 126 (*)    All other components within normal limits    EKG None  Radiology DG Ankle Complete Left  Result Date: 03/26/2022 CLINICAL DATA:  Pain and swelling, possible insect bite EXAM: LEFT ANKLE COMPLETE - 3+  VIEW COMPARISON:  None Available. FINDINGS: Frontal, oblique, lateral views of the left ankle are obtained. There is diffuse soft tissue swelling. No subcutaneous gas or radiopaque foreign body identified. No acute or destructive bony lesions. Mild osteoarthritis of the tibiotalar joint. IMPRESSION: 1. Diffuse soft tissue swelling. 2. No acute or destructive bony lesions. 3. Mild osteoarthritis. Electronically Signed   By: Sharlet Salina M.D.   On: 03/26/2022 17:56    Procedures Procedures    Medications Ordered in ED Medications  doxycycline (VIBRA-TABS) tablet 100 mg (100 mg Oral Given 03/26/22 1904)    ED Course/ Medical Decision Making/ A&P  Medical Decision Making Pt with hx and exam suggesting insect bite, possibly with early cellulitis vs localized inflammatory reaction.  She will be started on abx, doxycycline given here.  Advised elevation, warm compresses. Close f/u with pcp or recheck here if not improving beyond 1st 48 hours of abx.   Amount and/or Complexity of Data Reviewed Labs: ordered.    Details: Cbg normal Radiology: ordered.    Details: Reviewed,  no acute fractures, fb.  Diffuse edema.   Risk Prescription drug management.           Final Clinical Impression(s) / ED Diagnoses Final diagnoses:  Cellulitis of left lower extremity    Rx / DC Orders ED Discharge Orders          Ordered    doxycycline (VIBRAMYCIN) 100 MG capsule  2 times daily        03/26/22 1852              Burgess Amor, Cordelia Poche 03/26/22 2058    Jacalyn Lefevre, MD 03/26/22 2259

## 2022-03-29 DIAGNOSIS — M62830 Muscle spasm of back: Secondary | ICD-10-CM | POA: Diagnosis not present

## 2022-03-29 DIAGNOSIS — T148XXA Other injury of unspecified body region, initial encounter: Secondary | ICD-10-CM | POA: Diagnosis not present

## 2022-03-29 DIAGNOSIS — Z1211 Encounter for screening for malignant neoplasm of colon: Secondary | ICD-10-CM | POA: Diagnosis not present

## 2022-03-29 DIAGNOSIS — J329 Chronic sinusitis, unspecified: Secondary | ICD-10-CM | POA: Diagnosis not present

## 2022-03-30 DIAGNOSIS — M542 Cervicalgia: Secondary | ICD-10-CM | POA: Diagnosis not present

## 2022-04-03 ENCOUNTER — Ambulatory Visit
Admission: EM | Admit: 2022-04-03 | Discharge: 2022-04-03 | Disposition: A | Discharge status: Home / Self Care | Payer: Medicare Other | Attending: Nurse Practitioner | Admitting: Nurse Practitioner

## 2022-04-03 DIAGNOSIS — L03113 Cellulitis of right upper limb: Secondary | ICD-10-CM

## 2022-04-03 MED ORDER — PREDNISONE 20 MG PO TABS: 40.0000 mg | ORAL_TABLET | Freq: Every day | ORAL | 0 refills | Status: AC

## 2022-04-03 MED ORDER — DEXAMETHASONE SODIUM PHOSPHATE 10 MG/ML IJ SOLN
10.0000 mg | INTRAMUSCULAR | Status: AC
Start: 2022-04-03 — End: 2022-04-03
  Administered 2022-04-03: 10 mg via INTRAMUSCULAR

## 2022-04-03 MED ORDER — CEPHALEXIN 500 MG PO CAPS: 500.0000 mg | ORAL_CAPSULE | Freq: Four times a day (QID) | ORAL | 0 refills | Status: AC

## 2022-04-03 MED ORDER — MUPIROCIN 2 % EX OINT: 1.0000 | TOPICAL_OINTMENT | Freq: Two times a day (BID) | CUTANEOUS | 0 refills | Status: DC

## 2022-04-03 NOTE — Discharge Instructions (Addendum)
Take medication as prescribed.  I have decided to have you stop the doxycycline that you are currently taking, and switch you to Keflex.  Take medication as prescribed. Cool compresses to the affected area 3-4 times daily. Clean the area at least twice daily with Dial Gold bar soap or another type of antibacterial soap. Keep the area covered while it is draining. Follow-up in this clinic if you continue to experience pain is, increased swelling, or the area begins to look like a pimple. Follow-up in this clinic or go to the emergency department if you develop fever, chills, generalized fatigue, nausea, vomiting, or if the redness fails to improve.

## 2022-04-03 NOTE — ED Triage Notes (Signed)
Pt reports blisters, itching, drainage  in right arm, started after insect bite x 1 week.

## 2022-04-03 NOTE — ED Provider Notes (Signed)
[facility]    CSN: [id number] Arrival date & time: 04/03/22  1556      History   Chief Complaint Chief Complaint  Patient presents with   Insect Bite    HPI Jasmine Rhodes is a 76 y.o. female.   The history is provided by the patient.   Patient presents with a possible insect bite to the right forearm that she noticed over the past week.  She states prior to that she had an area to her left lower extremity that she thinks she was bitten by something.  She went to the emergency department and was started on doxycycline.  She states that since that time, she noticed a "bedbug" to the right forearm that she thinks may have bitten her.  She states that she has been having increased redness, swelling, and blistering to the right forearm.  She denies fever, chills, chest pain, abdominal pain, nausea, vomiting, or diarrhea.  Patient states that she has been cleaning the area with peroxide and alcohol.  She states that she has 3 days of doxycycline remaining.  Past Medical History:  Diagnosis Date   Asthma    DDD (degenerative disc disease), lumbar    Hyperlipidemia    Hypertension    Osteoarthritis    Sleep apnea     Patient Active Problem List   Diagnosis Date Noted   Bleeding internal hemorrhoids 07/06/2020   GERD (gastroesophageal reflux disease) 04/20/2020   Abdominal bloating 04/20/2020   Gastritis and gastroduodenitis    History of colonic polyps 03/11/2017   Chronic RLQ pain 03/11/2017   Abdominal pain, epigastric 03/11/2017   Dysphagia 03/11/2017   HTN (hypertension) 01/11/2017   Overweight 01/11/2017   Hyperlipidemia 01/11/2017   Allergic rhinitis 01/11/2017   Mild intermittent asthma 01/11/2017   Osteoarthritis of both knees 01/11/2017   Carpal tunnel syndrome, right 01/11/2017   Chest pain 01/11/2017   Stress 01/11/2017   Lumbago with sciatica, right side 01/11/2017    Past Surgical History:  Procedure Laterality Date   BALLOON DILATION  N/A 05/10/2020   Procedure: BALLOON DILATION;  Surgeon: Lanelle Bal, DO;  Location: [facility];  Service: Endoscopy;  Laterality: N/A;   BIOPSY  05/10/2020   Procedure: BIOPSY;  Surgeon: Lanelle Bal, DO;  Location: [facility];  Service: Endoscopy;;   BREAST BIOPSY Right    BUNIONECTOMY Bilateral 2015   CARPAL TUNNEL RELEASE Right 03/30/2017   COLONOSCOPY     COLONOSCOPY N/A 04/25/2017   Procedure: COLONOSCOPY;  Surgeon: West Bali, MD;  Location: [facility];  Service: Endoscopy;  Laterality: N/A;  1200   DG THUMB LEFT HAND     ESOPHAGOGASTRODUODENOSCOPY N/A 04/25/2017   Procedure: ESOPHAGOGASTRODUODENOSCOPY (EGD);  Surgeon: West Bali, MD;  Location: [facility];  Service: Endoscopy;  Laterality: N/A;   ESOPHAGOGASTRODUODENOSCOPY (EGD) WITH PROPOFOL N/A 05/10/2020   Procedure: ESOPHAGOGASTRODUODENOSCOPY (EGD) WITH PROPOFOL;  Surgeon: Lanelle Bal, DO;  Location: [facility];  Service: Endoscopy;  Laterality: N/A;  2:00pm   SAVORY DILATION N/A 04/25/2017   Procedure: SAVORY DILATION;  Surgeon: West Bali, MD;  Location: [facility];  Service: Endoscopy;  Laterality: N/A;   tonsils removal      OB History   No obstetric history on file.      Home Medications    Prior to Admission medications   Medication Sig Start Date End Date Taking? Authorizing Provider  cephALEXin (KEFLEX) 500 MG capsule Take 1 capsule (  500 mg total) by mouth 4 (four) times daily for 7 days. 04/03/22 04/10/22 Yes Keylon Labelle-Warren, Sadie Haber, NP  mupirocin ointment (BACTROBAN) 2 % Apply 1 Application topically 2 (two) times daily. 04/03/22  Yes Kaitlan Bin-Warren, Sadie Haber, NP  predniSONE (DELTASONE) 20 MG tablet Take 2 tablets (40 mg total) by mouth daily with breakfast for 5 days. 04/03/22 04/08/22 Yes Shella Lahman-Warren, Sadie Haber, NP  acetaminophen (TYLENOL) 500 MG tablet Take 1,000 mg by mouth every 6 (six) hours as needed for moderate pain or headache.    [provider]   albuterol (VENTOLIN HFA) 108 (90 Base) MCG/ACT inhaler Inhale 2 puffs into the lungs every 6 (six) hours as needed for wheezing or shortness of breath.    [provider]  Ascorbic Acid (VITAMIN C) 1000 MG tablet Take 1,000 mg by mouth daily.    [provider]  aspirin EC 81 MG tablet Take 81 mg by mouth daily.    [provider]  atenolol (TENORMIN) 100 MG tablet Take 50 mg by mouth daily.    [provider]  b complex vitamins capsule Take 1 capsule by mouth once a week.    [provider]  bismuth subsalicylate (PEPTO BISMOL) 262 MG/15ML suspension Take 30 mLs by mouth every 6 (six) hours as needed for diarrhea or loose stools.    [provider]  cetirizine (ZYRTEC) 10 MG tablet Take 10 mg by mouth daily as needed for allergies.     [provider]  cholecalciferol (VITAMIN D3) 25 MCG (1000 UNIT) tablet Take 1,000 Units by mouth daily.    [provider]  diclofenac sodium (VOLTAREN) 1 % GEL Apply 2 g topically daily as needed (for knee pain).  03/19/18   [provider]  diclofenac Sodium (VOLTAREN) 1 % GEL Apply 2 g topically 4 (four) times daily. 11/27/20   Jeannie Fend, PA-C  disulfiram (ANTABUSE) 250 MG tablet Take 250 mg by mouth as needed (alcohol relapse).  11/16/19   [provider]  doxycycline (VIBRAMYCIN) 100 MG capsule Take 1 capsule (100 mg total) by mouth 2 (two) times daily. 03/26/22   Burgess Amor, PA-C  fluticasone (FLONASE) 50 MCG/ACT nasal spray Place 1 spray into both nostrils as needed for allergies.     [provider]  hydrocortisone cream 1 % Apply 1 application topically daily as needed for itching.     [provider]  Multiple Vitamin (MULTIVITAMIN WITH MINERALS) TABS tablet Take 1 tablet by mouth daily.    [provider]  omeprazole (PRILOSEC) 40 MG capsule Take 1 capsule (40 mg total) by mouth daily. 07/06/20 07/06/21  Lanelle Bal, DO  Polyethyl  Glycol-Propyl Glycol (SYSTANE ULTRA OP) Apply 1 drop to eye 2 (two) times daily as needed (dry eye relief).     [provider]  potassium citrate (UROCIT-K) 10 MEQ (1080 MG) SR tablet Take 10 mEq by mouth daily.    [provider]  pravastatin (PRAVACHOL) 20 MG tablet Take 20 mg by mouth daily.    [provider]  triamterene-hydrochlorothiazide (DYAZIDE) 37.5-25 MG capsule Take 1 capsule by mouth daily.    [provider]    Family History Family History  Problem Relation Age of Onset   Hypertension Mother    Breast cancer Maternal Grandmother    Colon cancer Brother 18       Half-brother related by her mother    Social History Social History   Tobacco Use   Smoking  status: Every Day    Packs/day: 0.25    Years: 40.00    Total pack years: 10.00    Types: Cigarettes   Smokeless tobacco: Never  Vaping Use   Vaping Use: Never used  Substance Use Topics   Alcohol use: Yes    Comment: occasional   Drug use: No     Allergies   Sulfa antibiotics   Review of Systems Review of Systems Per HPI  Physical Exam Triage Vital Signs ED Triage Vitals  Enc Vitals Group     BP 04/03/22 1710 117/72     Pulse Rate 04/03/22 1710 76     Resp 04/03/22 1710 16     Temp 04/03/22 1710 98.2 F (36.8 C)     Temp Source 04/03/22 1710 Oral     SpO2 04/03/22 1710 98 %     Weight --      Height --      Head Circumference --      Peak Flow --      Pain Score 04/03/22 1713 0     Pain Loc --      Pain Edu? --      Excl. in GC? --    No data found.  Updated Vital Signs BP 117/72 (BP Location: Right Arm)   Pulse 76   Temp 98.2 F (36.8 C) (Oral)   Resp 16   SpO2 98%   Visual Acuity Right Eye Distance:   Left Eye Distance:   Bilateral Distance:    Right Eye Near:   Left Eye Near:    Bilateral Near:     Physical Exam Vitals and nursing note reviewed.  Constitutional:      General: She is not in acute distress.    Appearance: Normal  appearance.  HENT:     Head: Normocephalic.     Mouth/Throat:     Mouth: Mucous membranes are dry.  Eyes:     Extraocular Movements: Extraocular movements intact.     Pupils: Pupils are equal, round, and reactive to light.  Cardiovascular:     Pulses: Normal pulses.     Heart sounds: Normal heart sounds.  Pulmonary:     Effort: Pulmonary effort is normal.     Breath sounds: Normal breath sounds.  Abdominal:     General: Bowel sounds are normal.     Palpations: Abdomen is soft.  Skin:    General: Skin is warm and dry.     Comments: Lesion with blistering noted to the lateral aspect of the right forearm. Area measures approximately 4cm x 3 cm in diameter. Area is warm to touch. There is redness extending up the right forearm with swelling.   Neurological:     General: No focal deficit present.     Mental Status: She is alert and oriented to person, place, and time.  Psychiatric:        Mood and Affect: Mood normal.        Behavior: Behavior normal.      UC Treatments / Results  Labs (all labs ordered are listed, but only abnormal results are displayed) Labs Reviewed - No data to display  EKG   Radiology No results found.  Procedures Procedures (including critical care time)  Medications Ordered in UC Medications  dexamethasone (DECADRON) injection 10 mg (10 mg Intramuscular Given 04/03/22 1752)    Initial Impression / Assessment and Plan / UC Course  I have reviewed the triage vital signs and the nursing notes.  Pertinent labs & imaging results that were available during my care of the patient were reviewed by me and considered in my medical decision making (see chart for details).  Patient presents with a suspected insect bite to the right forearm.  On exam, she has a 4 cm x 3 cm induration to the lateral aspect of the right forearm.  There is blistering noted.  She also has erythema going up the right forearm.  Area is warm to palpation.  Symptoms are consistent  with cellulitis.  Patient is currently taking doxycycline at this time.  We will have patient stop the doxycycline and switch her to Keflex.  Decadron 10 mg IM injection was given in the clinic today to help with inflammation and swelling.  Patient was also prescribed prednisone 40 mg for the next 5 days for her symptoms.  Supportive care recommendations were provided to the patient.  Patient was given strict indications of when to return to this clinic or to the emergency department.  Patient verbalizes understanding.  All questions were answered. Final Clinical Impressions(s) / UC Diagnoses   Final diagnoses:  Cellulitis of right forearm     Discharge Instructions      Take medication as prescribed.  I have decided to have you stop the doxycycline that you are currently taking, and switch you to Keflex.  Take medication as prescribed. Cool compresses to the affected area 3-4 times daily. Clean the area at least twice daily with Dial Gold bar soap or another type of antibacterial soap. Keep the area covered while it is draining. Follow-up in this clinic if you continue to experience pain is, increased swelling, or the area begins to look like a pimple. Follow-up in this clinic or go to the emergency department if you develop fever, chills, generalized fatigue, nausea, vomiting, or if the redness fails to improve.       ED Prescriptions     Medication Sig Dispense Auth. Provider   mupirocin ointment (BACTROBAN) 2 % Apply 1 Application topically 2 (two) times daily. 30 g Valina Maes-Warren, Sadie Haber, NP   predniSONE (DELTASONE) 20 MG tablet Take 2 tablets (40 mg total) by mouth daily with breakfast for 5 days. 10 tablet Tephanie Escorcia-Warren, Sadie Haber, NP   cephALEXin (KEFLEX) 500 MG capsule Take 1 capsule (500 mg total) by mouth 4 (four) times daily for 7 days. 28 capsule Jalissa Heinzelman-Warren, Sadie Haber, NP      PDMP not reviewed this encounter.   Abran Cantor, NP 04/03/22 1754

## 2022-04-05 ENCOUNTER — Encounter: Payer: Self-pay | Admitting: *Deleted

## 2022-04-10 DIAGNOSIS — M542 Cervicalgia: Secondary | ICD-10-CM | POA: Diagnosis not present

## 2022-04-13 DIAGNOSIS — M542 Cervicalgia: Secondary | ICD-10-CM | POA: Diagnosis not present

## 2022-04-17 DIAGNOSIS — M542 Cervicalgia: Secondary | ICD-10-CM | POA: Diagnosis not present

## 2022-04-19 DIAGNOSIS — M542 Cervicalgia: Secondary | ICD-10-CM | POA: Diagnosis not present

## 2022-04-23 ENCOUNTER — Encounter: Payer: Self-pay | Admitting: Gastroenterology

## 2022-04-23 ENCOUNTER — Ambulatory Visit (INDEPENDENT_AMBULATORY_CARE_PROVIDER_SITE_OTHER): Payer: Medicare Other | Admitting: Gastroenterology

## 2022-04-23 VITALS — BP 126/76 | HR 92 | Temp 97.6°F | Ht 65.0 in | Wt 147.8 lb

## 2022-04-23 DIAGNOSIS — K219 Gastro-esophageal reflux disease without esophagitis: Secondary | ICD-10-CM | POA: Diagnosis not present

## 2022-04-23 DIAGNOSIS — R140 Abdominal distension (gaseous): Secondary | ICD-10-CM

## 2022-04-23 DIAGNOSIS — K648 Other hemorrhoids: Secondary | ICD-10-CM | POA: Diagnosis not present

## 2022-04-23 DIAGNOSIS — Z1211 Encounter for screening for malignant neoplasm of colon: Secondary | ICD-10-CM | POA: Diagnosis not present

## 2022-04-23 NOTE — Progress Notes (Signed)
GI Office Note    Referring Provider: Benita Stabile, MD Primary Care Physician:  Benita Stabile, MD Primary Gastroenterologist: Dr. Marletta Lor  Date:  04/23/2022  ID:  Jasmine Rhodes, Jasmine Rhodes [birth year], MRN [id number]   Chief Complaint   Chief Complaint  Patient presents with   Follow-up    Visit for colonoscopy    History of Present Illness  Jasmine Rhodes is a 76 y.o. female with a history of asthma, hyperlipidemia, hypertension, sleep apnea presenting today to discuss colonoscopy.  Last EGD October 2021: Benign-appearing esophageal stenosis s/p dilation.  Gastritis s/p biopsy.  Duodenal polyposis s/p biopsy.  Duodenal biopsies with nodular peptic duodenitis.  Gastric biopsies with mild chronic gastritis and mild reactive gastropathy negative H. pylori.  Last colonoscopy in 2018 was unremarkable other than internal hemorrhoids and diverticulosis in the sigmoid and descending colon.  Redundant left colon.  Advise high-fiber diet and to repeat colonoscopy due to age.  Last seen 07/06/2020 for follow-up.  She reported her dysphagia had resolved and her chronic reflux is well-controlled omeprazole 40 mg daily.  She did report some intermittent bloating and 1 episode of BRB on toilet tissue with a recent bowel movement.  She was recommended to take over-the-counter Gas-X as needed for bloating.  Rectal bleeding felt to be likely due to hemorrhoids.  Follow-up in 6 months or sooner if needed.  Today: No dark stools. Does have some intermittent blood on the toilet tissue. Does have some bloating but not any sharp abdominal pains. May have some intermittent cramping. No unintentional weight loss or early satiety. Does have reflux - has not been taking omeprazole. Has daily issues with reflux. Has been trying a tablespoon of vinegar daily and not sure if that is helping much or not.   She denies any abdominal pain, melena, overt BRBPR, chest pain, shortness of breath, lower extremity  edema.  Does have some lack of appetite since her mother passed earlier this year.   Has changed up her allergy medications recently. Has been taking pepto bismol as needed for her reflux.   Reports she was bit by a spider a couple weeks ago on her arm. Reports it was a brown recluse and she had been on antibiotics for a previous spider bite. Has since had her house sprayed and treated.   Repots a half brother had colon cancer and diverticulitis in his 13's.    Current Outpatient Medications  Medication Sig Dispense Refill   acetaminophen (TYLENOL) 500 MG tablet Take 1,000 mg by mouth every 6 (six) hours as needed for moderate pain or headache.     albuterol (VENTOLIN HFA) 108 (90 Base) MCG/ACT inhaler Inhale 2 puffs into the lungs every 6 (six) hours as needed for wheezing or shortness of breath.     Ascorbic Acid (VITAMIN C) 1000 MG tablet Take 1,000 mg by mouth daily.     aspirin EC 81 MG tablet Take 81 mg by mouth daily.     atenolol (TENORMIN) 100 MG tablet Take 50 mg by mouth daily.     b complex vitamins capsule Take 1 capsule by mouth once a week.     bismuth subsalicylate (PEPTO BISMOL) 262 MG/15ML suspension Take 30 mLs by mouth every 6 (six) hours as needed for diarrhea or loose stools.     cetirizine (ZYRTEC) 10 MG tablet Take 10 mg by mouth daily as needed for allergies.      cholecalciferol (VITAMIN D3) 25 MCG (1000 UNIT) tablet Take  1,000 Units by mouth daily.     diclofenac sodium (VOLTAREN) 1 % GEL Apply 2 g topically daily as needed (for knee pain).   3   diclofenac Sodium (VOLTAREN) 1 % GEL Apply 2 g topically 4 (four) times daily. 100 g 0   Multiple Vitamin (MULTIVITAMIN WITH MINERALS) TABS tablet Take 1 tablet by mouth daily.     Polyethyl Glycol-Propyl Glycol (SYSTANE ULTRA OP) Apply 1 drop to eye 2 (two) times daily as needed (dry eye relief).      potassium citrate (UROCIT-K) 10 MEQ (1080 MG) SR tablet Take 10 mEq by mouth daily.     pravastatin (PRAVACHOL) 20 MG  tablet Take 20 mg by mouth daily.     triamterene-hydrochlorothiazide (DYAZIDE) 37.5-25 MG capsule Take 1 capsule by mouth daily.     disulfiram (ANTABUSE) 250 MG tablet Take 250 mg by mouth as needed (alcohol relapse).      montelukast (SINGULAIR) 10 MG tablet Take 10 mg by mouth daily.     No current facility-administered medications for this visit.    Past Medical History:  Diagnosis Date   Asthma    DDD (degenerative disc disease), lumbar    Hyperlipidemia    Hypertension    Osteoarthritis    Sleep apnea     Past Surgical History:  Procedure Laterality Date   BALLOON DILATION N/A 05/10/2020   Procedure: BALLOON DILATION;  Surgeon: Lanelle Bal, DO;  Location: [facility];  Service: Endoscopy;  Laterality: N/A;   BIOPSY  05/10/2020   Procedure: BIOPSY;  Surgeon: Lanelle Bal, DO;  Location: [facility];  Service: Endoscopy;;   BREAST BIOPSY Right    BUNIONECTOMY Bilateral 2015   CARPAL TUNNEL RELEASE Right 03/30/2017   COLONOSCOPY     COLONOSCOPY N/A 04/25/2017   Procedure: COLONOSCOPY;  Surgeon: West Bali, MD;  Location: [facility];  Service: Endoscopy;  Laterality: N/A;  1200   DG THUMB LEFT HAND     ESOPHAGOGASTRODUODENOSCOPY N/A 04/25/2017   Procedure: ESOPHAGOGASTRODUODENOSCOPY (EGD);  Surgeon: West Bali, MD;  Location: [facility];  Service: Endoscopy;  Laterality: N/A;   ESOPHAGOGASTRODUODENOSCOPY (EGD) WITH PROPOFOL N/A 05/10/2020   Procedure: ESOPHAGOGASTRODUODENOSCOPY (EGD) WITH PROPOFOL;  Surgeon: Lanelle Bal, DO;  Location: [facility];  Service: Endoscopy;  Laterality: N/A;  2:00pm   SAVORY DILATION N/A 04/25/2017   Procedure: SAVORY DILATION;  Surgeon: West Bali, MD;  Location: [facility];  Service: Endoscopy;  Laterality: N/A;   tonsils removal      Family History  Problem Relation Age of Onset   Hypertension Mother    Breast cancer Maternal Grandmother    Colon cancer Brother 9       Half-brother related  by her mother    Allergies as of 04/23/2022 - Review Complete 04/23/2022  Allergen Reaction Noted   Sulfa antibiotics Rash 01/11/2017    Social History   Socioeconomic History   Marital status: Divorced    Spouse name: Not on file   Number of children: Not on file   Years of education: Not on file   Highest education level: Not on file  Occupational History   Not on file  Tobacco Use   Smoking status: Every Day    Packs/day: 0.25    Years: 40.00    Total pack years: 10.00    Types: Cigarettes   Smokeless tobacco: Never  Vaping Use   Vaping Use: Never used  Substance and Sexual  Activity   Alcohol use: Yes    Comment: occasional   Drug use: No   Sexual activity: Not on file  Other Topics Concern   Not on file  Social History Narrative   Not on file   Social Determinants of Health   Financial Resource Strain: Not on file  Food Insecurity: Not on file  Transportation Needs: Not on file  Physical Activity: Not on file  Stress: Not on file  Social Connections: Not on file     Review of Systems   Gen: Denies fever, chills, anorexia. Denies fatigue, weakness, weight loss.  CV: Denies chest pain, palpitations, syncope, peripheral edema, and claudication. Resp: Denies dyspnea at rest, cough, wheezing, coughing up blood, and pleurisy. GI: See HPI Derm: Denies rash, itching, dry skin Psych: Denies depression, anxiety, memory loss, confusion. No homicidal or suicidal ideation.  Heme: Denies bruising, bleeding, and enlarged lymph nodes.   Physical Exam   BP 126/76   Pulse 92   Temp 97.6 F (36.4 C)   Ht 5\' 5"  (1.651 m)   Wt 147 lb 12.8 oz (67 kg)   BMI 24.60 kg/m   General:   Alert and oriented. No distress noted. Pleasant and cooperative.  Head:  Normocephalic and atraumatic. Eyes:  Conjuctiva clear without scleral icterus. Mouth:  Oral mucosa pink and moist. Good dentition. No lesions. Lungs:  Clear to auscultation bilaterally. No wheezes, rales, or  rhonchi. No distress.  Heart:  S1, S2 present without murmurs appreciated.  Abdomen:  +BS, soft, non-tender and non-distended. No rebound or guarding. No HSM or masses noted. Rectal: deferred Msk:  Symmetrical without gross deformities. Normal posture. Extremities:  Without edema. Neurologic:  Alert and  oriented x4 Psych:  Alert and cooperative. Normal mood and affect.   Assessment  Jasmine Rhodes is a 76 y.o. female with a history of asthma, hyperlipidemia, hypertension, sleep apnea presenting today to discuss performing screening colonoscopy.  Screening for colon cancer: Last colonoscopy in 2018 with redundant colon, diverticulosis, and internal hemorrhoids.  No evidence of polyps.  Per last discussion with Dr. 2019, he recommended repeat in 7 years.  Not currently having any alarm symptoms or change in bowel habits.  She does have family history of colon cancer in her half brother however currently I do not feel that she warrants an early interval colonoscopy.  She will be due in 2025.  GERD: Not adequately controlled.  Has been using intermittent Pepto-Bismol or small dose of vinegar to treat reflux.  Patient unsure of last date that she stopped taking her omeprazole.  Currently not on any maintenance therapy.  For now advised to stop Pepto-Bismol and advised her to begin famotidine 20 mg daily, 30 minutes prior to breakfast.  I discussed with her that if famotidine was not helpful that we could reconsider resuming omeprazole.  GERD diet/lifestyle modifications discussed.  Abdominal bloating/constipation: Appears to be chronic.  We discussed dietary modifications.  Advised that she may use simethicone or Gas-X as needed.  Intermittent rectal bleeding: Likely secondary to hemorrhoids given known history on colonoscopy in 2018.  Rectal bleeding mostly present with bowel movements and only present on toilet tissue.  Repeat colonoscopy not warranted at this time.  Hemorrhoid precautions  discussed.   PLAN   Avoid high gas producing foods High fiber diet Simethicone/Gas-X as needed Stop use of Pepto-Bismol Recommend starting famotidine 20 mg daily, 30 minutes prior to breakfast Could consider resuming omeprazole if famotidine not helpful. GERD diet/lifestyle modifications addressed  Avoid straining, hemorrhoid precautions discussed. Due for repeat colonoscopy in 2025, per Dr. Abbey Chatters. Follow-up in 6 months or sooner if needed    Jasmine Night, MSN, FNP-BC, AGACNP-BC [facility]

## 2022-04-23 NOTE — Patient Instructions (Addendum)
For constipation/bloating: You may take an over-the-counter stool softener as needed or begin taking MiraLAX 1 capful as needed if you have feelings of constipation. You may also take over-the-counter simethicone (Gas-X) as needed for bloating. Avoid gas-producing foods (eg, cabbage, legumes, onions, broccoli, brussel sprouts, wheat, and potatoes).  For hemorrhoids you may use over-the-counter Preparation H.  The best treatment is avoidance of constipation with higher fiber diet and avoiding straining.  For your reflux: Pepto-Bismol can make your stools darker and is a quick relief for reflux. Given that you have daily symptoms, I would recommend over-the-counter famotidine (Pepcid) 20 mg daily, 30 minutes prior to breakfast or resuming your prior omeprazole.  If you decide you would like to resume her omeprazole after trying the famotidine, please contact the office. Follow a GERD diet:  Avoid fried, fatty, greasy, spicy, citrus foods. Avoid caffeine and carbonated beverages. Avoid chocolate. Try eating 4-6 small meals a day rather than 3 large meals. Do not eat within 3 hours of laying down. Prop head of bed up on wood or bricks to create a 6 inch incline.   Per review of Dr. Ave Filter last office note with you.  He recommended a repeat colonoscopy in 7 years.  This would make you due in year 2025.    Lets plan to follow-up in 6 months or sooner regarding your reflux/constipation.   It was a pleasure to see you today. I want to create trusting relationships with patients. If you receive a survey regarding your visit,  I greatly appreciate you taking time to fill this out on paper or through your MyChart. I value your feedback.  Venetia Night, MSN, FNP-BC, AGACNP-BC [facility]

## 2022-04-24 DIAGNOSIS — M542 Cervicalgia: Secondary | ICD-10-CM | POA: Diagnosis not present

## 2022-04-27 DIAGNOSIS — M5412 Radiculopathy, cervical region: Secondary | ICD-10-CM | POA: Diagnosis not present

## 2022-04-27 DIAGNOSIS — M25512 Pain in left shoulder: Secondary | ICD-10-CM | POA: Diagnosis not present

## 2022-04-27 DIAGNOSIS — M542 Cervicalgia: Secondary | ICD-10-CM | POA: Diagnosis not present

## 2022-05-11 ENCOUNTER — Ambulatory Visit
Admission: EM | Admit: 2022-05-11 | Discharge: 2022-05-11 | Disposition: A | Discharge status: Home / Self Care | Payer: Medicare Other | Attending: Nurse Practitioner | Admitting: Nurse Practitioner

## 2022-05-11 DIAGNOSIS — W57XXXA Bitten or stung by nonvenomous insect and other nonvenomous arthropods, initial encounter: Secondary | ICD-10-CM

## 2022-05-11 DIAGNOSIS — J019 Acute sinusitis, unspecified: Secondary | ICD-10-CM | POA: Diagnosis not present

## 2022-05-11 DIAGNOSIS — S80869A Insect bite (nonvenomous), unspecified lower leg, initial encounter: Secondary | ICD-10-CM | POA: Diagnosis not present

## 2022-05-11 DIAGNOSIS — L03119 Cellulitis of unspecified part of limb: Secondary | ICD-10-CM | POA: Diagnosis not present

## 2022-05-11 MED ORDER — AMOXICILLIN-POT CLAVULANATE 875-125 MG PO TABS
1.0000 | ORAL_TABLET | Freq: Two times a day (BID) | ORAL | 0 refills | Status: DC
Start: 2022-05-11 — End: 2022-08-01

## 2022-05-11 MED ORDER — PREDNISONE 20 MG PO TABS
20.0000 mg | ORAL_TABLET | Freq: Every day | ORAL | 0 refills | Status: AC
Start: 2022-05-11 — End: 2022-05-18

## 2022-05-11 MED ORDER — TRIAMCINOLONE ACETONIDE 0.1 % EX CREA
1.0000 | TOPICAL_CREAM | Freq: Two times a day (BID) | CUTANEOUS | 0 refills | Status: AC
Start: 2022-05-11 — End: ?

## 2022-05-11 NOTE — ED Triage Notes (Signed)
Pt reports cough, itching ears,sneezing,chills, insect bite in feet and redness x 2 days.

## 2022-05-11 NOTE — Discharge Instructions (Addendum)
Take medication as directed. Continue your current allergy medication regimen. Please take the Zyrtec daily. Increase fluids and get plenty of rest. May take over-the-counter Tylenol as needed for pain, fever, or general discomfort. Recommend normal saline nasal spray to help with nasal congestion throughout the day. For your cough, it may be helpful to use a humidifier at bedtime during sleep.  Insect Bites: Take medication as prescribed. Zyrtec will also help with itching. Avoid scratching, rubbing or manipulating the areas while symptoms persist. May apply cool compresses to help with itching and swelling. Follow-up with your primary care physician if symptoms fail to improve.

## 2022-05-11 NOTE — ED Provider Notes (Signed)
[facility]    CSN: [id number] Arrival date & time: 05/11/22  1606      History   Chief Complaint Chief Complaint  Patient presents with   Insect Bite    HPI Jasmine Rhodes is a 76 y.o. female.   The history is provided by the patient.   Patient presents for complaints of insect bites to her lower extremities and upper respiratory symptoms that been present for the past 2 weeks.  Patient states the insect bites occurred 2 days ago.  She states that she is unsure what may have bitten her.  She thinks it may have been some red and sore either a spider.  She states the area to her lower extremities are swollen and itchy.  She denies fever, chills, chest pain, abdominal pain, nausea, vomiting, or diarrhea.  She states that she was using an old prescription of triamcinolone cream to help with the itching.  She also presents for complaints of chills, nasal congestion, runny nose, itching ears, cough, and head congestion.  Symptoms have been present over the last 2 weeks.  She reports symptoms have gradually worsened.  Patient believes that she may have had a fever over the past 2 to 3 days.  She states that the cough is productive.  She denies fever, chills, ear pain, wheezing, shortness of breath, chest pain, nausea, vomiting, or diarrhea.  She has been taking over-the-counter medicines for her symptoms. Past Medical History:  Diagnosis Date   Asthma    DDD (degenerative disc disease), lumbar    Hyperlipidemia    Hypertension    Osteoarthritis    Sleep apnea     Patient Active Problem List   Diagnosis Date Noted   Bleeding internal hemorrhoids 07/06/2020   GERD (gastroesophageal reflux disease) 04/20/2020   Abdominal bloating 04/20/2020   Gastritis and gastroduodenitis    History of colonic polyps 03/11/2017   Chronic RLQ pain 03/11/2017   Abdominal pain, epigastric 03/11/2017   Dysphagia 03/11/2017   HTN (hypertension) 01/11/2017   Overweight 01/11/2017    Hyperlipidemia 01/11/2017   Allergic rhinitis 01/11/2017   Mild intermittent asthma 01/11/2017   Osteoarthritis of both knees 01/11/2017   Carpal tunnel syndrome, right 01/11/2017   Chest pain 01/11/2017   Stress 01/11/2017   Lumbago with sciatica, right side 01/11/2017    Past Surgical History:  Procedure Laterality Date   BALLOON DILATION N/A 05/10/2020   Procedure: BALLOON DILATION;  Surgeon: Eloise Harman, DO;  Location: [facility];  Service: Endoscopy;  Laterality: N/A;   BIOPSY  05/10/2020   Procedure: BIOPSY;  Surgeon: Eloise Harman, DO;  Location: [facility];  Service: Endoscopy;;   BREAST BIOPSY Right    BUNIONECTOMY Bilateral 2015   CARPAL TUNNEL RELEASE Right 03/30/2017   COLONOSCOPY     COLONOSCOPY N/A 04/25/2017   Procedure: COLONOSCOPY;  Surgeon: Danie Binder, MD;  Location: [facility];  Service: Endoscopy;  Laterality: N/A;  1200   DG THUMB LEFT HAND     ESOPHAGOGASTRODUODENOSCOPY N/A 04/25/2017   Procedure: ESOPHAGOGASTRODUODENOSCOPY (EGD);  Surgeon: Danie Binder, MD;  Location: [facility];  Service: Endoscopy;  Laterality: N/A;   ESOPHAGOGASTRODUODENOSCOPY (EGD) WITH PROPOFOL N/A 05/10/2020   Procedure: ESOPHAGOGASTRODUODENOSCOPY (EGD) WITH PROPOFOL;  Surgeon: Eloise Harman, DO;  Location: [facility];  Service: Endoscopy;  Laterality: N/A;  2:00pm   SAVORY DILATION N/A 04/25/2017   Procedure: SAVORY DILATION;  Surgeon: Danie Binder, MD;  Location: [facility];  Service: Endoscopy;  Laterality: N/A;   tonsils removal      OB History   No obstetric history on file.      Home Medications    Prior to Admission medications   Medication Sig Start Date End Date Taking? Authorizing Provider  amoxicillin-clavulanate (AUGMENTIN) 875-125 MG tablet Take 1 tablet by mouth every 12 (twelve) hours. 05/11/22  Yes Tranise Forrest-Warren, Alda Lea, NP  predniSONE (DELTASONE) 20 MG tablet Take 1 tablet (20 mg total) by mouth daily with  breakfast for 7 days. 05/11/22 05/18/22 Yes Kailani Brass-Warren, Alda Lea, NP  triamcinolone cream (KENALOG) 0.1 % Apply 1 Application topically 2 (two) times daily. 05/11/22  Yes Mirel Hundal-Warren, Alda Lea, NP  acetaminophen (TYLENOL) 500 MG tablet Take 1,000 mg by mouth every 6 (six) hours as needed for moderate pain or headache.    [provider]  albuterol (VENTOLIN HFA) 108 (90 Base) MCG/ACT inhaler Inhale 2 puffs into the lungs every 6 (six) hours as needed for wheezing or shortness of breath.    [provider]  Ascorbic Acid (VITAMIN C) 1000 MG tablet Take 1,000 mg by mouth daily.    [provider]  aspirin EC 81 MG tablet Take 81 mg by mouth daily.    [provider]  atenolol (TENORMIN) 100 MG tablet Take 50 mg by mouth daily.    [provider]  b complex vitamins capsule Take 1 capsule by mouth once a week.    [provider]  bismuth subsalicylate (PEPTO BISMOL) 262 MG/15ML suspension Take 30 mLs by mouth every 6 (six) hours as needed for diarrhea or loose stools.    [provider]  cetirizine (ZYRTEC) 10 MG tablet Take 10 mg by mouth daily as needed for allergies.     [provider]  cholecalciferol (VITAMIN D3) 25 MCG (1000 UNIT) tablet Take 1,000 Units by mouth daily.    [provider]  diclofenac sodium (VOLTAREN) 1 % GEL Apply 2 g topically daily as needed (for knee pain).  03/19/18   [provider]  diclofenac Sodium (VOLTAREN) 1 % GEL Apply 2 g topically 4 (four) times daily. 11/27/20   Tacy Learn, PA-C  disulfiram (ANTABUSE) 250 MG tablet Take 250 mg by mouth as needed (alcohol relapse).  11/16/19   [provider]  montelukast (SINGULAIR) 10 MG tablet Take 10 mg by mouth daily. 10/30/21   [provider]  Multiple Vitamin (MULTIVITAMIN WITH MINERALS) TABS tablet Take 1 tablet by mouth daily.    [provider]  Polyethyl Glycol-Propyl Glycol (SYSTANE ULTRA OP) Apply  1 drop to eye 2 (two) times daily as needed (dry eye relief).     [provider]  potassium citrate (UROCIT-K) 10 MEQ (1080 MG) SR tablet Take 10 mEq by mouth daily.    [provider]  pravastatin (PRAVACHOL) 20 MG tablet Take 20 mg by mouth daily.    [provider]  triamterene-hydrochlorothiazide (DYAZIDE) 37.5-25 MG capsule Take 1 capsule by mouth daily.    [provider]    Family History Family History  Problem Relation Age of Onset   Hypertension Mother    Breast cancer Maternal Grandmother    Colon cancer Brother 48       Half-brother related by her mother    Social History Social History   Tobacco Use   Smoking status: Every Day    Packs/day: 0.25    Years: 40.00    Total pack years: 10.00  Types: Cigarettes   Smokeless tobacco: Never  Vaping Use   Vaping Use: Never used  Substance Use Topics   Alcohol use: Yes    Comment: occasional   Drug use: No     Allergies   Sulfa antibiotics   Review of Systems Review of Systems Per HPI  Physical Exam Triage Vital Signs ED Triage Vitals  Enc Vitals Group     BP 05/11/22 1732 (!) 145/84     Pulse Rate 05/11/22 1732 80     Resp 05/11/22 1732 18     Temp 05/11/22 1732 98.2 F (36.8 C)     Temp Source 05/11/22 1732 Oral     SpO2 05/11/22 1732 98 %     Weight --      Height --      Head Circumference --      Peak Flow --      Pain Score 05/11/22 1733 4     Pain Loc --      Pain Edu? --      Excl. in [place]? --    No data found.  Updated Vital Signs BP (!) 145/84 (BP Location: Right Arm)   Pulse 80   Temp 98.2 F (36.8 C) (Oral)   Resp 18   SpO2 98%   Visual Acuity Right Eye Distance:   Left Eye Distance:   Bilateral Distance:    Right Eye Near:   Left Eye Near:    Bilateral Near:     Physical Exam Vitals and nursing note reviewed.  Constitutional:      General: She is not in acute distress.    Appearance: Normal appearance.  HENT:     Head:  Normocephalic.     Right Ear: Tympanic membrane, ear canal and external ear normal.     Left Ear: Tympanic membrane, ear canal and external ear normal.     Nose: Congestion present.     Mouth/Throat:     Mouth: Mucous membranes are moist.     Pharynx: No oropharyngeal exudate or posterior oropharyngeal erythema.  Eyes:     Extraocular Movements: Extraocular movements intact.     Conjunctiva/sclera: Conjunctivae normal.     Pupils: Pupils are equal, round, and reactive to light.  Cardiovascular:     Rate and Rhythm: Normal rate and regular rhythm.     Pulses: Normal pulses.     Heart sounds: Normal heart sounds.  Pulmonary:     Effort: Pulmonary effort is normal.     Breath sounds: Normal breath sounds.  Abdominal:     General: Bowel sounds are normal.     Palpations: Abdomen is soft.  Musculoskeletal:     Cervical back: Normal range of motion.  Skin:    General: Skin is warm and dry.     Comments: 2 indurations noted to the dorsal and medial aspect of the left ankle measuring approximately 1.5 cm in diameter.  1 induration noted to the lateral aspect of the right ankle that measures approximately 1 cm in diameter.  Areas are erythematous, and raised.  There is no oozing, fluctuance, or drainage present.  Neurological:     General: No focal deficit present.     Mental Status: She is alert and oriented to person, place, and time.  Psychiatric:        Mood and Affect: Mood normal.        Behavior: Behavior normal.      UC Treatments / Results  Labs (all labs  ordered are listed, but only abnormal results are displayed) Labs Reviewed - No data to display  EKG   Radiology No results found.  Procedures Procedures (including critical care time)  Medications Ordered in UC Medications - No data to display  Initial Impression / Assessment and Plan / UC Course  I have reviewed the triage vital signs and the nursing notes.  Pertinent labs & imaging results that were  available during my care of the patient were reviewed by me and considered in my medical decision making (see chart for details).  Patient presents for complaints of upper respiratory symptoms and insect bites to the bilateral ankles.  On exam, patient has indurations to her bilateral ankles that are erythematous and raised, symptoms are consistent with an insect bite causing cellulitis.  With regard to her upper respiratory symptoms, she does have nasal congestion, runny nose, and sinus pressure consistent with acute sinusitis.  We will treat patient with for the insect bites with prednisone 20 mg and triamcinolone cream, and for her sinus symptoms, will treat with Augmentin 875/125 mg.  COVID and flu testing is not indicated at this time based on the duration of the patient's symptoms.  Physical exam findings reassuring and vital signs stable for discharge. Advised supportive care, offered symptomatic relief. Counseled patient on potential for adverse effects with medications prescribed/recommended today, ER and return-to-clinic precautions discussed, patient verbalized understanding.    Final Clinical Impressions(s) / UC Diagnoses   Final diagnoses:  Acute sinusitis, recurrence not specified, unspecified location  Insect bite of lower leg, unspecified laterality, initial encounter  Cellulitis of lower extremity, unspecified laterality     Discharge Instructions      Take medication as directed. Continue your current allergy medication regimen. Please take the Zyrtec daily. Increase fluids and get plenty of rest. May take over-the-counter Tylenol as needed for pain, fever, or general discomfort. Recommend normal saline nasal spray to help with nasal congestion throughout the day. For your cough, it may be helpful to use a humidifier at bedtime during sleep.  Insect Bites: Take medication as prescribed. Zyrtec will also help with itching. Avoid scratching, rubbing or manipulating the areas  while symptoms persist. May apply cool compresses to help with itching and swelling. Follow-up with your primary care physician if symptoms fail to improve.      ED Prescriptions     Medication Sig Dispense Auth. Provider   predniSONE (DELTASONE) 20 MG tablet Take 1 tablet (20 mg total) by mouth daily with breakfast for 7 days. 7 tablet Clair Alfieri-Warren, Alda Lea, NP   triamcinolone cream (KENALOG) 0.1 % Apply 1 Application topically 2 (two) times daily. 30 g Ewan Grau-Warren, Alda Lea, NP   amoxicillin-clavulanate (AUGMENTIN) 875-125 MG tablet Take 1 tablet by mouth every 12 (twelve) hours. 14 tablet Hayden Mabin-Warren, Alda Lea, NP      PDMP not reviewed this encounter.   Tish Men, NP 05/11/22 [id number]

## 2022-05-12 DIAGNOSIS — M5412 Radiculopathy, cervical region: Secondary | ICD-10-CM | POA: Diagnosis not present

## 2022-05-22 DIAGNOSIS — M25512 Pain in left shoulder: Secondary | ICD-10-CM | POA: Diagnosis not present

## 2022-05-22 DIAGNOSIS — M5412 Radiculopathy, cervical region: Secondary | ICD-10-CM | POA: Diagnosis not present

## 2022-06-01 DIAGNOSIS — Z23 Encounter for immunization: Secondary | ICD-10-CM | POA: Diagnosis not present

## 2022-06-01 DIAGNOSIS — T148XXA Other injury of unspecified body region, initial encounter: Secondary | ICD-10-CM | POA: Diagnosis not present

## 2022-06-20 DIAGNOSIS — I10 Essential (primary) hypertension: Secondary | ICD-10-CM | POA: Diagnosis not present

## 2022-06-20 DIAGNOSIS — R7301 Impaired fasting glucose: Secondary | ICD-10-CM | POA: Diagnosis not present

## 2022-06-20 DIAGNOSIS — E785 Hyperlipidemia, unspecified: Secondary | ICD-10-CM | POA: Diagnosis not present

## 2022-06-27 ENCOUNTER — Other Ambulatory Visit (HOSPITAL_COMMUNITY): Payer: Self-pay | Admitting: Family Medicine

## 2022-06-27 DIAGNOSIS — J302 Other seasonal allergic rhinitis: Secondary | ICD-10-CM | POA: Diagnosis not present

## 2022-06-27 DIAGNOSIS — R079 Chest pain, unspecified: Secondary | ICD-10-CM | POA: Diagnosis not present

## 2022-06-27 DIAGNOSIS — R600 Localized edema: Secondary | ICD-10-CM | POA: Diagnosis not present

## 2022-06-27 DIAGNOSIS — F17200 Nicotine dependence, unspecified, uncomplicated: Secondary | ICD-10-CM | POA: Diagnosis not present

## 2022-06-27 DIAGNOSIS — R7301 Impaired fasting glucose: Secondary | ICD-10-CM | POA: Diagnosis not present

## 2022-06-27 DIAGNOSIS — M25561 Pain in right knee: Secondary | ICD-10-CM | POA: Diagnosis not present

## 2022-06-27 DIAGNOSIS — E782 Mixed hyperlipidemia: Secondary | ICD-10-CM | POA: Diagnosis not present

## 2022-06-27 DIAGNOSIS — Z1211 Encounter for screening for malignant neoplasm of colon: Secondary | ICD-10-CM | POA: Diagnosis not present

## 2022-06-27 DIAGNOSIS — G4700 Insomnia, unspecified: Secondary | ICD-10-CM | POA: Diagnosis not present

## 2022-06-27 DIAGNOSIS — H6121 Impacted cerumen, right ear: Secondary | ICD-10-CM | POA: Diagnosis not present

## 2022-06-27 DIAGNOSIS — Z0000 Encounter for general adult medical examination without abnormal findings: Secondary | ICD-10-CM | POA: Diagnosis not present

## 2022-06-27 DIAGNOSIS — I10 Essential (primary) hypertension: Secondary | ICD-10-CM | POA: Diagnosis not present

## 2022-07-10 DIAGNOSIS — J310 Chronic rhinitis: Secondary | ICD-10-CM | POA: Diagnosis not present

## 2022-07-11 ENCOUNTER — Ambulatory Visit
Admission: RE | Admit: 2022-07-11 | Discharge: 2022-07-11 | Disposition: A | Discharge status: Home / Self Care | Payer: Medicare Other | Source: Ambulatory Visit | Attending: Family Medicine | Admitting: Family Medicine

## 2022-07-11 DIAGNOSIS — J439 Emphysema, unspecified: Secondary | ICD-10-CM | POA: Diagnosis not present

## 2022-07-11 DIAGNOSIS — F17210 Nicotine dependence, cigarettes, uncomplicated: Secondary | ICD-10-CM | POA: Insufficient documentation

## 2022-07-11 DIAGNOSIS — I2510 Atherosclerotic heart disease of native coronary artery without angina pectoris: Secondary | ICD-10-CM | POA: Diagnosis not present

## 2022-07-11 DIAGNOSIS — I700 Atherosclerosis of aorta: Secondary | ICD-10-CM | POA: Diagnosis not present

## 2022-07-11 DIAGNOSIS — Z122 Encounter for screening for malignant neoplasm of respiratory organs: Secondary | ICD-10-CM | POA: Diagnosis not present

## 2022-07-11 DIAGNOSIS — F17200 Nicotine dependence, unspecified, uncomplicated: Secondary | ICD-10-CM | POA: Insufficient documentation

## 2022-07-13 ENCOUNTER — Ambulatory Visit: Payer: Medicare Other | Admitting: Cardiovascular Disease

## 2022-08-01 ENCOUNTER — Ambulatory Visit: Payer: 59 | Attending: Cardiovascular Disease | Admitting: Cardiovascular Disease

## 2022-08-01 ENCOUNTER — Encounter: Payer: Self-pay | Admitting: Cardiovascular Disease

## 2022-08-01 VITALS — BP 126/80 | HR 81 | Ht 65.5 in | Wt 146.0 lb

## 2022-08-01 DIAGNOSIS — R072 Precordial pain: Secondary | ICD-10-CM

## 2022-08-01 DIAGNOSIS — I2510 Atherosclerotic heart disease of native coronary artery without angina pectoris: Secondary | ICD-10-CM | POA: Diagnosis not present

## 2022-08-01 DIAGNOSIS — E7800 Pure hypercholesterolemia, unspecified: Secondary | ICD-10-CM

## 2022-08-01 DIAGNOSIS — J432 Centrilobular emphysema: Secondary | ICD-10-CM

## 2022-08-01 DIAGNOSIS — F17200 Nicotine dependence, unspecified, uncomplicated: Secondary | ICD-10-CM

## 2022-08-01 DIAGNOSIS — I10 Essential (primary) hypertension: Secondary | ICD-10-CM

## 2022-08-01 DIAGNOSIS — R69 Illness, unspecified: Secondary | ICD-10-CM | POA: Diagnosis not present

## 2022-08-01 LAB — BASIC METABOLIC PANEL
BUN/Creatinine Ratio: 19 (ref 12–28)
BUN: 13 mg/dL (ref 8–27)
CO2: 27 mmol/L (ref 20–29)
Calcium: 9.5 mg/dL (ref 8.7–10.3)
Chloride: 100 mmol/L (ref 96–106)
Creatinine, Ser: 0.68 mg/dL (ref 0.57–1.00)
Glucose: 111 mg/dL — ABNORMAL HIGH (ref 70–99)
Potassium: 3.8 mmol/L (ref 3.5–5.2)
Sodium: 140 mmol/L (ref 134–144)
eGFR: 91 mL/min/{1.73_m2} (ref >59)

## 2022-08-01 NOTE — Progress Notes (Signed)
Cardiology Office Note:    Date:  08/03/2022   ID:  Jasmine, Rhodes 1945/09/19, MRN [id number]  PCP:  Benita Stabile, MD   [facility] Providers Cardiologist:  None     Referring MD: Leone Payor, FNP   No chief complaint on file. Jasmine Rhodes is a 77 y.o. female who is being seen today for the evaluation of chest pain at the request of Leone Payor, FNP.   History of Present Illness:    Jasmine Rhodes is a 76 y.o. female smoker with a hx of hyperlipidemia and hypertension referred for recent complaints of chest discomfort.  She has had chest pain while lying in bed and describes the discomfort as being sharp and associated with numbness and tingling in both upper extremities.  He is relatively sedentary due to pain in her right knee and is anticipating that she will need a right total knee replacement.  Activity is not limited by shortness of breath or chest pain.  She does not have palpitations, dizziness, syncope, focal neurological complaints or intermittent claudication.  CT of the chest performed for lung cancer screening has shown three-vessel coronary artery and aortic atherosclerotic calcifications.  She has smoked less than a pack a day for over 50 years and has made numerous resolution to quit smoking.  Her current pack of cigarettes is her "last pack".  Pressure is well-controlled on current medications.  She does not have diabetes mellitus.  Her most recent lipid profile showed an LDL cholesterol of 84 with an excellent HDL of 118.  She has normal renal function.  She has a previous history of esophageal balloon dilation for stricture.   Past Medical History:  Diagnosis Date   Asthma    DDD (degenerative disc disease), lumbar    Hyperlipidemia    Hypertension    Osteoarthritis    Sleep apnea     Past Surgical History:  Procedure Laterality Date   BALLOON DILATION N/A 05/10/2020   Procedure: BALLOON DILATION;  Surgeon:  Lanelle Bal, DO;  [facility];  Service: Endoscopy;  Laterality: N/A;   BIOPSY  05/10/2020   Procedure: BIOPSY;  Surgeon: Lanelle Bal, DO;  [facility];  Service: Endoscopy;;   BREAST BIOPSY Right    BUNIONECTOMY Bilateral 2015   CARPAL TUNNEL RELEASE Right 03/30/2017   COLONOSCOPY     COLONOSCOPY N/A 04/25/2017   Procedure: COLONOSCOPY;  Surgeon: West Bali, MD;  [facility];  Service: Endoscopy;  Laterality: N/A;  1200   DG THUMB LEFT HAND     ESOPHAGOGASTRODUODENOSCOPY N/A 04/25/2017   Procedure: ESOPHAGOGASTRODUODENOSCOPY (EGD);  Surgeon: West Bali, MD;  [facility];  Service: Endoscopy;  Laterality: N/A;   ESOPHAGOGASTRODUODENOSCOPY (EGD) WITH PROPOFOL N/A 05/10/2020   Procedure: ESOPHAGOGASTRODUODENOSCOPY (EGD) WITH PROPOFOL;  Surgeon: Lanelle Bal, DO;  [facility];  Service: Endoscopy;  Laterality: N/A;  2:00pm   SAVORY DILATION N/A 04/25/2017   Procedure: SAVORY DILATION;  Surgeon: West Bali, MD;  [facility];  Service: Endoscopy;  Laterality: N/A;   tonsils removal      Current Medications: Current Meds  Medication Sig   albuterol (VENTOLIN HFA) 108 (90 Base) MCG/ACT inhaler Inhale 2 puffs into the lungs every 6 (six) hours as needed for wheezing or shortness of breath.   Ascorbic Acid (VITAMIN C) 1000 MG tablet Take 1,000 mg by mouth daily.   aspirin EC 81 MG  tablet Take 81 mg by mouth daily.   atenolol (TENORMIN) 50 MG tablet Take 1 tablet by mouth daily.   cetirizine (ZYRTEC) 10 MG tablet Take 10 mg by mouth daily as needed for allergies.    cholecalciferol (VITAMIN D3) 25 MCG (1000 UNIT) tablet Take 1,000 Units by mouth daily.   diclofenac sodium (VOLTAREN) 1 % GEL Apply 2 g topically daily as needed (for knee pain).    ipratropium (ATROVENT) 0.06 % nasal spray Place 2 sprays into both nostrils 4 (four) times daily.   montelukast (SINGULAIR) 10 MG tablet Take 10 mg by  mouth daily.   Multiple Vitamin (MULTIVITAMIN WITH MINERALS) TABS tablet Take 1 tablet by mouth daily.   omeprazole (PRILOSEC) 40 MG capsule Take 40 mg by mouth daily.   Polyethyl Glycol-Propyl Glycol (SYSTANE ULTRA OP) Apply 1 drop to eye 2 (two) times daily as needed (dry eye relief).    potassium chloride (MICRO-K) 10 MEQ CR capsule Take 10 mEq by mouth daily.   pravastatin (PRAVACHOL) 20 MG tablet Take 20 mg by mouth daily.   tobramycin-dexamethasone (TOBRADEX) ophthalmic ointment Place 1 Application into both eyes as needed.   triamcinolone cream (KENALOG) 0.1 % Apply 1 Application topically 2 (two) times daily.   triamterene-hydrochlorothiazide (DYAZIDE) 37.5-25 MG capsule Take 1 capsule by mouth daily.     Allergies:   Sulfa antibiotics   Social History   Socioeconomic History   Marital status: Divorced    Spouse name: Not on file   Number of children: Not on file   Years of education: Not on file   Highest education level: Not on file  Occupational History   Not on file  Tobacco Use   Smoking status: Every Day    Packs/day: 0.25    Years: 40.00    Total pack years: 10.00    Types: Cigarettes   Smokeless tobacco: Never  Vaping Use   Vaping Use: Never used  Substance and Sexual Activity   Alcohol use: Yes    Comment: occasional   Drug use: No   Sexual activity: Not on file  Other Topics Concern   Not on file  Social History Narrative   Not on file   Social Determinants of Health   Financial Resource Strain: Not on file  Food Insecurity: Not on file  Transportation Needs: Not on file  Physical Activity: Not on file  Stress: Not on file  Social Connections: Not on file     Family History: The patient's family history includes Breast cancer in her maternal grandmother; Colon cancer (age of onset: 87) in her brother; Hypertension in her mother.  ROS:   Please see the history of present illness.     All other systems reviewed and are  negative.  EKGs/Labs/Other Studies Reviewed:    The following studies were reviewed today:   EKG:  EKG is  ordered today.  The ekg ordered today demonstrates normal sinus rhythm, normal tracing  Recent Labs: 08/01/2022: BUN 13; Creatinine, Ser 0.68; Potassium 3.8; Sodium 140   06/20/2022 Hemoglobin A1c 5.2% Hemoglobin 13.6 Creatinine 0.73, potassium 4.0, ALT 10 Recent Lipid Panel No results found for: "CHOL", "TRIG", "HDL", "CHOLHDL", "VLDL", "LDLCALC", "LDLDIRECT" 06/20/2022 Cholesterol 215, HDL 118, LDL 84, triglycerides 72  Risk Assessment/Calculations:       Physical Exam:    VS:  BP 126/80 (BP Location: Left Arm, Patient Position: Sitting, Cuff Size: Normal)   Pulse 81   Ht 5' 5.5" (1.664 m)   Wt 146  lb (66.2 kg)   SpO2 97%   BMI 23.93 kg/m     Wt Readings from Last 3 Encounters:  08/01/22 146 lb (66.2 kg)  04/23/22 147 lb 12.8 oz (67 kg)  11/27/20 147 lb (66.7 kg)     GEN: Lean, well nourished, well developed in no acute distress HEENT: Normal NECK: No JVD; No carotid bruits LYMPHATICS: No lymphadenopathy CARDIAC: RRR, no murmurs, rubs, gallops RESPIRATORY:  Clear to auscultation without rales, wheezing or rhonchi  ABDOMEN: Soft, non-tender, non-distended MUSCULOSKELETAL:  No edema; No deformity  SKIN: Warm and dry NEUROLOGIC:  Alert and oriented x 3 PSYCHIATRIC:  Normal affect   ASSESSMENT:    1. Precordial pain   2. Coronary artery calcification seen on CT scan   3. Hypercholesterolemia   4. Essential hypertension   5. Centrilobular emphysema ([place])   6. Smoking    PLAN:    In order of problems listed above:  Chest pain: Atypical, but she does have evidence of extensive atherosclerotic coronary calcifications and has several risk factors for vascular complications including ongoing tobacco use and hypertension and hypercholesterolemia.  Will schedule for coronary CT angiography.   Hypercholesterolemia: Even if we do not identify coronary  stenoses, quantification of her coronary calcium score will allow [place] to refine her lipid-lowering prescription.  She is currently taking a rather weak statin and we could "upgrade this" to achieve a target LDL cholesterol less than 70 if her coronary calcium score is high. HTN: Controlled on the current medications.  Will have her take a higher dose of atenolol and skip her diuretics on the day of coronary CT angiography. Smoking/emphysema: Congratulated her on her decision to quit smoking permanently.  Emphysema noted on chest CT.           Medication Adjustments/Labs and Tests Ordered: Current medicines are reviewed at length with the patient today.  Concerns regarding medicines are outlined above.  Orders Placed This Encounter  Procedures   CT CORONARY MORPH W/CTA COR W/SCORE W/CA W/CM &/OR WO/CM   Basic metabolic panel   EKG 62-XBMW   No orders of the defined types were placed in this encounter.   Patient Instructions  Medication Instructions:  Your physician recommends that you continue on your current medications as directed. Please refer to the Current Medication list given to you today.  *If you need a refill on your cardiac medications before your next appointment, please call your pharmacy*   Lab Work: Your physician recommends that you have labs drawn today: BMET  If you have labs (blood work) drawn today and your tests are completely normal, you will receive your results only by: [place] (if you have MyChart) OR A paper copy in the mail If you have any lab test that is abnormal or we need to change your treatment, we will call you to review the results.   Testing/Procedures: See below   Follow-Up: At [facility], you and your health needs are our priority.  As part of our continuing mission to provide you with exceptional heart care, we have created designated Provider Care Teams.  These Care Teams include your primary Cardiologist (physician)  and Advanced Practice Providers (APPs -  Physician Assistants and Nurse Practitioners) who all work together to provide you with the care you need, when you need it.  We recommend signing up for the patient portal called "MyChart".  Sign up information is provided on this After Visit Summary.  MyChart is used to connect with  patients for Virtual Visits (Telemedicine).  Patients are able to view lab/test results, encounter notes, upcoming appointments, etc.  Non-urgent messages can be sent to your provider as well.   To learn more about what you can do with MyChart, go to [email or web address].    Your next appointment:   We will see you on an as needed basis.   Provider:   Thurmon Fair, MD    Other Instructions   Your cardiac CT will be scheduled at the below location:   [facility] [address] [place], [place] [address] [phone]   If scheduled at [facility], please arrive at the [facility] and Children's Entrance (Entrance C2) of [facility] 30 minutes prior to test start time. You can use the FREE valet parking offered at entrance C (encouraged to control the heart rate for the test)  Proceed to the [facility] Radiology Department (first floor) to check-in and test prep.  All radiology patients and guests should use entrance C2 at [facility], accessed from [facility], even though the hospital's physical address listed is [address]     Please follow these instructions carefully (unless otherwise directed):   On the Night Before the Test: Be sure to Drink plenty of water. Do not consume any caffeinated/decaffeinated beverages or chocolate 12 hours prior to your test. Do not take any antihistamines 12 hours prior to your test.  On the Day of the Test: Drink plenty of water until 1 hour prior to the test. Do not eat any food 1 hour prior to test. You may take your regular medications prior to the  test.  Take atenolol (tenormin) 100mg  two hours prior to test. HOLD Furosemide/Hydrochlorothiazide morning of the test. FEMALES- please wear underwire-free bra if available, avoid dresses & tight clothing       After the Test: Drink plenty of water. After receiving IV contrast, you may experience a mild flushed feeling. This is normal. On occasion, you may experience a mild rash up to 24 hours after the test. This is not dangerous. If this occurs, you can take Benadryl 25 mg and increase your fluid intake. If you experience trouble breathing, this can be serious. If it is severe call 911 IMMEDIATELY. If it is mild, please call our office. If you take any of these medications: Glipizide/Metformin, Avandament, Glucavance, please do not take 48 hours after completing test unless otherwise instructed.  We will call to schedule your test 2-4 weeks out understanding that some insurance companies will need an authorization prior to the service being performed.   For non-scheduling related questions, please contact the cardiac imaging nurse navigator should you have any questions/concerns: , Cardiac Imaging Nurse Navigator [place], Cardiac Imaging Nurse Navigator [facility] Direct Office Dial: [phone]   For scheduling needs, including cancellations and rescheduling, please call [phone], [phone].    Signed, [phone], MD  08/03/2022 2:54 PM    [facility]

## 2022-08-01 NOTE — Patient Instructions (Addendum)
Medication Instructions:  Your physician recommends that you continue on your current medications as directed. Please refer to the Current Medication list given to you today.  *If you need a refill on your cardiac medications before your next appointment, please call your pharmacy*   Lab Work: Your physician recommends that you have labs drawn today: BMET  If you have labs (blood work) drawn today and your tests are completely normal, you will receive your results only by: [place] (if you have MyChart) OR A paper copy in the mail If you have any lab test that is abnormal or we need to change your treatment, we will call you to review the results.   Testing/Procedures: See below   Follow-Up: At [facility], you and your health needs are our priority.  As part of our continuing mission to provide you with exceptional heart care, we have created designated Provider Care Teams.  These Care Teams include your primary Cardiologist (physician) and Advanced Practice Providers (APPs -  Physician Assistants and Nurse Practitioners) who all work together to provide you with the care you need, when you need it.  We recommend signing up for the patient portal called "MyChart".  Sign up information is provided on this After Visit Summary.  MyChart is used to connect with patients for Virtual Visits (Telemedicine).  Patients are able to view lab/test results, encounter notes, upcoming appointments, etc.  Non-urgent messages can be sent to your provider as well.   To learn more about what you can do with MyChart, go to [email or web address].    Your next appointment:   We will see you on an as needed basis.   Provider:   Sanda Klein, MD    Other Instructions   Your cardiac CT will be scheduled at the below location:   [facility] [address] [place], [place] [address] [phone]   If scheduled at [facility], please arrive at the [facility]  and Children's Entrance (Entrance C2) of [facility] 30 minutes prior to test start time. You can use the FREE valet parking offered at entrance C (encouraged to control the heart rate for the test)  Proceed to the [facility] Radiology Department (first floor) to check-in and test prep.  All radiology patients and guests should use entrance C2 at [facility], accessed from [facility], even though the hospital's physical address listed is [address].     Please follow these instructions carefully (unless otherwise directed):   On the Night Before the Test: Be sure to Drink plenty of water. Do not consume any caffeinated/decaffeinated beverages or chocolate 12 hours prior to your test. Do not take any antihistamines 12 hours prior to your test.  On the Day of the Test: Drink plenty of water until 1 hour prior to the test. Do not eat any food 1 hour prior to test. You may take your regular medications prior to the test.  Take atenolol (tenormin) 100mg  two hours prior to test. HOLD Furosemide/Hydrochlorothiazide morning of the test. FEMALES- please wear underwire-free bra if available, avoid dresses & tight clothing       After the Test: Drink plenty of water. After receiving IV contrast, you may experience a mild flushed feeling. This is normal. On occasion, you may experience a mild rash up to 24 hours after the test. This is not dangerous. If this occurs, you can take Benadryl 25 mg and increase your fluid intake. If you  experience trouble breathing, this can be serious. If it is severe call 911 IMMEDIATELY. If it is mild, please call our office. If you take any of these medications: Glipizide/Metformin, Avandament, Glucavance, please do not take 48 hours after completing test unless otherwise instructed.  We will call to schedule your test 2-4 weeks out understanding that some insurance companies will need an authorization prior to the service  being performed.   For non-scheduling related questions, please contact the cardiac imaging nurse navigator should you have any questions/concerns: Marchia Bond, Cardiac Imaging Nurse Navigator Gordy Clement, Cardiac Imaging Nurse Navigator [facility] Direct Office Dial: [phone]   For scheduling needs, including cancellations and rescheduling, please call [place], [phone].

## 2022-08-03 ENCOUNTER — Encounter: Payer: Self-pay | Admitting: Cardiovascular Disease

## 2022-08-03 DIAGNOSIS — J432 Centrilobular emphysema: Secondary | ICD-10-CM | POA: Insufficient documentation

## 2022-08-03 DIAGNOSIS — I2510 Atherosclerotic heart disease of native coronary artery without angina pectoris: Secondary | ICD-10-CM | POA: Insufficient documentation

## 2022-08-10 DIAGNOSIS — J310 Chronic rhinitis: Secondary | ICD-10-CM | POA: Diagnosis not present

## 2022-08-14 ENCOUNTER — Telehealth (HOSPITAL_COMMUNITY): Payer: Self-pay | Admitting: *Deleted

## 2022-08-14 NOTE — Telephone Encounter (Signed)
Patient returning call regarding upcoming cardiac imaging study; pt verbalizes understanding of appt date/time, parking situation and where to check in, pre-test NPO status and verified current allergies; name and call back number provided for further questions should they arise  Gordy Clement RN Navigator Cardiac Imaging [facility] [phone] office [phone] cell  Patient to take 100mg  atenolol two hours prior to her cardiac CT scan. She is aware to arrive at 10:30 AM.

## 2022-08-14 NOTE — Telephone Encounter (Signed)
Attempted to call patient regarding upcoming cardiac CT appointment. °Left message on voicemail with name and callback number ° °Kanesha Cadle RN Navigator Cardiac Imaging °[facility] °[phone] Office °[phone] Cell ° °

## 2022-08-16 ENCOUNTER — Other Ambulatory Visit: Payer: Self-pay | Admitting: Internal Medicine

## 2022-08-16 ENCOUNTER — Ambulatory Visit (HOSPITAL_COMMUNITY)
Admission: RE | Admit: 2022-08-16 | Discharge: 2022-08-16 | Disposition: A | Discharge status: Home / Self Care | Payer: Medicare HMO | Source: Ambulatory Visit | Attending: Cardiovascular Disease | Admitting: Cardiovascular Disease

## 2022-08-16 DIAGNOSIS — R931 Abnormal findings on diagnostic imaging of heart and coronary circulation: Secondary | ICD-10-CM | POA: Diagnosis present

## 2022-08-16 DIAGNOSIS — R072 Precordial pain: Secondary | ICD-10-CM

## 2022-08-16 MED ORDER — NITROGLYCERIN 0.4 MG SL SUBL
0.8000 mg | SUBLINGUAL_TABLET | Freq: Once | SUBLINGUAL | Status: AC
  Administered 2022-08-16: 0.8 mg via SUBLINGUAL

## 2022-08-16 MED ORDER — NITROGLYCERIN 0.4 MG SL SUBL
SUBLINGUAL_TABLET | SUBLINGUAL | Status: AC
  Filled 2022-08-16: qty 2

## 2022-08-16 MED ORDER — IOHEXOL 350 MG/ML SOLN
100.0000 mL | Freq: Once | INTRAVENOUS | Status: AC | PRN
  Administered 2022-08-16: 100 mL via INTRAVENOUS

## 2022-08-17 ENCOUNTER — Other Ambulatory Visit: Payer: Self-pay

## 2022-08-17 ENCOUNTER — Ambulatory Visit (HOSPITAL_BASED_OUTPATIENT_CLINIC_OR_DEPARTMENT_OTHER)
Admission: RE | Admit: 2022-08-17 | Discharge: 2022-08-17 | Disposition: A | Discharge status: Home / Self Care | Payer: Medicare HMO | Source: Ambulatory Visit | Attending: Internal Medicine | Admitting: Internal Medicine

## 2022-08-17 DIAGNOSIS — R931 Abnormal findings on diagnostic imaging of heart and coronary circulation: Secondary | ICD-10-CM

## 2022-08-17 DIAGNOSIS — E7800 Pure hypercholesterolemia, unspecified: Secondary | ICD-10-CM

## 2022-08-17 DIAGNOSIS — R072 Precordial pain: Secondary | ICD-10-CM | POA: Diagnosis not present

## 2022-08-17 DIAGNOSIS — I2510 Atherosclerotic heart disease of native coronary artery without angina pectoris: Secondary | ICD-10-CM

## 2022-08-17 MED ORDER — ROSUVASTATIN CALCIUM 20 MG PO TABS: 20.0000 mg | ORAL_TABLET | Freq: Every day | ORAL | 3 refills | Status: DC

## 2022-10-05 ENCOUNTER — Ambulatory Visit
Admission: EM | Admit: 2022-10-05 | Discharge: 2022-10-05 | Disposition: A | Discharge status: Home / Self Care | Payer: Medicare HMO | Attending: Family Medicine | Admitting: Family Medicine

## 2022-10-05 DIAGNOSIS — B9789 Other viral agents as the cause of diseases classified elsewhere: Secondary | ICD-10-CM | POA: Diagnosis not present

## 2022-10-05 DIAGNOSIS — Z1152 Encounter for screening for COVID-19: Secondary | ICD-10-CM | POA: Insufficient documentation

## 2022-10-05 DIAGNOSIS — J329 Chronic sinusitis, unspecified: Secondary | ICD-10-CM | POA: Insufficient documentation

## 2022-10-05 LAB — POCT INFLUENZA A/B
Influenza A, POC: NEGATIVE
Influenza B, POC: NEGATIVE

## 2022-10-05 MED ORDER — DEXAMETHASONE SODIUM PHOSPHATE 10 MG/ML IJ SOLN
10.0000 mg | Freq: Once | INTRAMUSCULAR | Status: AC
Start: 2022-10-05 — End: 2022-10-05
  Administered 2022-10-05: 10 mg via INTRAMUSCULAR

## 2022-10-05 NOTE — ED Provider Notes (Signed)
[facility]    CSN: [id number] Arrival date & time: 10/05/22  1741      History   Chief Complaint No chief complaint on file.   HPI Jasmine Rhodes is a 77 y.o. female.   Presenting today with 3-day history of chills, body aches, sneezing, itchy watery eyes, itchy ears with ear pressure, nasal drainage, sore scratchy throat, cough.  Denies chest pain, shortness of breath, abdominal pain, nausea vomiting or diarrhea.  So far taking her allergy medication and nasal spray with no relief.  History of asthma and allergies.  No known sick contacts recently.    Past Medical History:  Diagnosis Date   Asthma    DDD (degenerative disc disease), lumbar    Hyperlipidemia    Hypertension    Osteoarthritis    Sleep apnea     Patient Active Problem List   Diagnosis Date Noted   Coronary artery calcification seen on CT scan 08/03/2022   Centrilobular emphysema ([place]) 08/03/2022   Bleeding internal hemorrhoids 07/06/2020   GERD (gastroesophageal reflux disease) 04/20/2020   Abdominal bloating 04/20/2020   Gastritis and gastroduodenitis    History of colonic polyps 03/11/2017   Chronic RLQ pain 03/11/2017   Abdominal pain, epigastric 03/11/2017   Dysphagia 03/11/2017   Essential hypertension 01/11/2017   Overweight 01/11/2017   Hypercholesterolemia 01/11/2017   Allergic rhinitis 01/11/2017   Mild intermittent asthma 01/11/2017   Osteoarthritis of both knees 01/11/2017   Carpal tunnel syndrome, right 01/11/2017   Chest pain 01/11/2017   Stress 01/11/2017   Lumbago with sciatica, right side 01/11/2017    Past Surgical History:  Procedure Laterality Date   BALLOON DILATION N/A 05/10/2020   Procedure: BALLOON DILATION;  Surgeon: Eloise Harman, DO;  [facility];  Service: Endoscopy;  Laterality: N/A;   BIOPSY  05/10/2020   Procedure: BIOPSY;  Surgeon: Eloise Harman, DO;  [facility];  Service: Endoscopy;;   BREAST BIOPSY Right     BUNIONECTOMY Bilateral 2015   CARPAL TUNNEL RELEASE Right 03/30/2017   COLONOSCOPY     COLONOSCOPY N/A 04/25/2017   Procedure: COLONOSCOPY;  Surgeon: Danie Binder, MD;  [facility];  Service: Endoscopy;  Laterality: N/A;  1200   DG THUMB LEFT HAND     ESOPHAGOGASTRODUODENOSCOPY N/A 04/25/2017   Procedure: ESOPHAGOGASTRODUODENOSCOPY (EGD);  Surgeon: Danie Binder, MD;  [facility];  Service: Endoscopy;  Laterality: N/A;   ESOPHAGOGASTRODUODENOSCOPY (EGD) WITH PROPOFOL N/A 05/10/2020   Procedure: ESOPHAGOGASTRODUODENOSCOPY (EGD) WITH PROPOFOL;  Surgeon: Eloise Harman, DO;  [facility];  Service: Endoscopy;  Laterality: N/A;  2:00pm   SAVORY DILATION N/A 04/25/2017   Procedure: SAVORY DILATION;  Surgeon: Danie Binder, MD;  [facility];  Service: Endoscopy;  Laterality: N/A;   tonsils removal      OB History   No obstetric history on file.      Home Medications    Prior to Admission medications   Medication Sig Start Date End Date Taking? Authorizing Provider  acetaminophen (TYLENOL) 500 MG tablet Take 1,000 mg by mouth every 6 (six) hours as needed for moderate pain or headache. Patient not taking: Reported on 08/01/2022    [provider]  albuterol (VENTOLIN HFA) 108 (90 Base) MCG/ACT inhaler Inhale 2 puffs into the lungs every 6 (six) hours as needed for wheezing or shortness of breath.    [provider]  Ascorbic Acid (VITAMIN C) 1000 MG  tablet Take 1,000 mg by mouth daily.    [provider]  aspirin EC 81 MG tablet Take 81 mg by mouth daily.    [provider]  atenolol (TENORMIN) 50 MG tablet Take 1 tablet by mouth daily.    [provider]  b complex vitamins capsule Take 1 capsule by mouth once a week. Patient not taking: Reported on 08/01/2022    [provider]  bismuth subsalicylate (PEPTO BISMOL) 262 MG/15ML suspension Take 30 mLs by mouth every 6 (six) hours as  needed for diarrhea or loose stools. Patient not taking: Reported on 08/01/2022    [provider]  cetirizine (ZYRTEC) 10 MG tablet Take 10 mg by mouth daily as needed for allergies.     [provider]  cholecalciferol (VITAMIN D3) 25 MCG (1000 UNIT) tablet Take 1,000 Units by mouth daily.    [provider]  diclofenac sodium (VOLTAREN) 1 % GEL Apply 2 g topically daily as needed (for knee pain).  03/19/18   [provider]  disulfiram (ANTABUSE) 250 MG tablet Take 250 mg by mouth as needed (alcohol relapse).  Patient not taking: Reported on 08/01/2022 11/16/19   [provider]  ipratropium (ATROVENT) 0.06 % nasal spray Place 2 sprays into both nostrils 4 (four) times daily. 07/10/22   [provider]  montelukast (SINGULAIR) 10 MG tablet Take 10 mg by mouth daily. 10/30/21   [provider]  Multiple Vitamin (MULTIVITAMIN WITH MINERALS) TABS tablet Take 1 tablet by mouth daily.    [provider]  omeprazole (PRILOSEC) 40 MG capsule Take 40 mg by mouth daily.    [provider]  Polyethyl Glycol-Propyl Glycol (SYSTANE ULTRA OP) Apply 1 drop to eye 2 (two) times daily as needed (dry eye relief).     [provider]  potassium chloride (MICRO-K) 10 MEQ CR capsule Take 10 mEq by mouth daily. 05/11/22   [provider]  rosuvastatin (CRESTOR) 20 MG tablet Take 1 tablet (20 mg total) by mouth daily. 08/17/22 08/12/23  Croitoru, Mihai, MD  tobramycin-dexamethasone [facility]) ophthalmic ointment Place 1 Application into both eyes as needed. 10/12/13   [provider]  triamcinolone cream (KENALOG) 0.1 % Apply 1 Application topically 2 (two) times daily. 05/11/22   Leath-Warren, Alda Lea, NP  triamterene-hydrochlorothiazide (DYAZIDE) 37.5-25 MG capsule Take 1 capsule by mouth daily.    [provider]    Family History Family History  Problem Relation Age of Onset   Hypertension Mother     Breast cancer Maternal Grandmother    Colon cancer Brother 110       Half-brother related by her mother    Social History Social History   Tobacco Use   Smoking status: Every Day    Packs/day: 0.25    Years: 40.00    Total pack years: 10.00    Types: Cigarettes   Smokeless tobacco: Never  Vaping Use   Vaping Use: Never used  Substance Use Topics   Alcohol use: Yes    Comment: occasional   Drug use: No     Allergies   Sulfa antibiotics   Review of Systems Review of Systems PER HPI  Physical Exam Triage Vital Signs ED Triage Vitals  Enc Vitals Group     BP 10/05/22 1747 118/72     Pulse Rate 10/05/22 1747 80     Resp 10/05/22 1747 18     Temp 10/05/22 1747 98.4 F (36.9 C)  Temp Source 10/05/22 1747 Oral     SpO2 10/05/22 1747 99 %     Weight --      Height --      Head Circumference --      Peak Flow --      Pain Score 10/05/22 1751 0     Pain Loc --      Pain Edu? --      Excl. in [place]? --    No data found.  Updated Vital Signs BP 118/72 (BP Location: Right Arm)   Pulse 80   Temp 98.4 F (36.9 C) (Oral)   Resp 18   SpO2 99%   Visual Acuity Right Eye Distance:   Left Eye Distance:   Bilateral Distance:    Right Eye Near:   Left Eye Near:    Bilateral Near:     Physical Exam Vitals and nursing note reviewed.  Constitutional:      Appearance: Normal appearance.  HENT:     Head: Atraumatic.     Right Ear: Tympanic membrane and external ear normal.     Left Ear: Tympanic membrane and external ear normal.     Nose: Rhinorrhea present.     Mouth/Throat:     Mouth: Mucous membranes are moist.     Pharynx: Posterior oropharyngeal erythema present.  Eyes:     Extraocular Movements: Extraocular movements intact.     Conjunctiva/sclera: Conjunctivae normal.  Cardiovascular:     Rate and Rhythm: Normal rate and regular rhythm.     Heart sounds: Normal heart sounds.  Pulmonary:     Effort: Pulmonary effort is normal.     Breath sounds:  Normal breath sounds. No wheezing or rales.  Musculoskeletal:        General: Normal range of motion.     Cervical back: Normal range of motion and neck supple.  Skin:    General: Skin is warm and dry.  Neurological:     Mental Status: She is alert and oriented to person, place, and time.  Psychiatric:        Mood and Affect: Mood normal.        Thought Content: Thought content normal.      UC Treatments / Results  Labs (all labs ordered are listed, but only abnormal results are displayed) Labs Reviewed  SARS CORONAVIRUS 2 (TAT 6-24 HRS)  POCT INFLUENZA A/B    EKG   Radiology No results found.  Procedures Procedures (including critical care time)  Medications Ordered in UC Medications  dexamethasone (DECADRON) injection 10 mg (10 mg Intramuscular Given 10/05/22 1846)    Initial Impression / Assessment and Plan / UC Course  I have reviewed the triage vital signs and the nursing notes.  Pertinent labs & imaging results that were available during my care of the patient were reviewed by me and considered in my medical decision making (see chart for details).     Suspect viral sinusitis, treat with IM Decadron, supportive over-the-counter medications, home care.  Rapid flu negative, COVID testing pending.  Return for worsening symptoms.  Final Clinical Impressions(s) / UC Diagnoses   Final diagnoses:  Viral sinusitis     Discharge Instructions      Take a nasal spray such as Flonase or Rhinocort twice daily, continue your allergy medication daily, take a decongestant such as Coricidin HBP that is safe with your heart condition and use humidifiers, sinus rinses and other supportive measures.  Your flu test was negative today, your  COVID test is pending and should be back tomorrow.    ED Prescriptions   None    PDMP not reviewed this encounter.   Volney American, [place] 10/05/22 Einar Crow

## 2022-10-05 NOTE — Discharge Instructions (Signed)
Take a nasal spray such as Flonase or Rhinocort twice daily, continue your allergy medication daily, take a decongestant such as Coricidin HBP that is safe with your heart condition and use humidifiers, sinus rinses and other supportive measures.  Your flu test was negative today, your COVID test is pending and should be back tomorrow.

## 2022-10-05 NOTE — ED Triage Notes (Signed)
Pt reports she has had a lot of sneezing, itchy eyes, itchy ears, itchy nose, scratchy throat, and a cough x 3 days.Took allergy med but no relief.

## 2022-10-06 LAB — SARS CORONAVIRUS 2 (TAT 6-24 HRS): SARS Coronavirus 2: NEGATIVE

## 2022-10-12 DIAGNOSIS — L821 Other seborrheic keratosis: Secondary | ICD-10-CM | POA: Diagnosis not present

## 2022-10-12 DIAGNOSIS — L811 Chloasma: Secondary | ICD-10-CM | POA: Diagnosis not present

## 2022-10-12 DIAGNOSIS — D485 Neoplasm of uncertain behavior of skin: Secondary | ICD-10-CM | POA: Diagnosis not present

## 2022-10-22 ENCOUNTER — Encounter: Payer: Self-pay | Admitting: Gastroenterology

## 2022-10-22 ENCOUNTER — Ambulatory Visit: Payer: Medicare HMO | Admitting: Gastroenterology

## 2022-10-22 VITALS — BP 138/77 | HR 66 | Temp 97.3°F | Ht 65.0 in | Wt 144.6 lb

## 2022-10-22 DIAGNOSIS — K5900 Constipation, unspecified: Secondary | ICD-10-CM | POA: Diagnosis not present

## 2022-10-22 DIAGNOSIS — K219 Gastro-esophageal reflux disease without esophagitis: Secondary | ICD-10-CM

## 2022-10-22 DIAGNOSIS — Z800 Family history of malignant neoplasm of digestive organs: Secondary | ICD-10-CM | POA: Diagnosis not present

## 2022-10-22 DIAGNOSIS — R140 Abdominal distension (gaseous): Secondary | ICD-10-CM | POA: Diagnosis not present

## 2022-10-22 NOTE — Progress Notes (Deleted)
Cheese is a constipation triggers. Usually having a BM once daily. No taking anything for constipation. No abdominal pain. Still has gas discomfort. Will be walking and pass gas. Recent started back with the gas ex.   Reflux is not good. Over the counter famotidine was helping at times. Not currently thinking anything for her GERD. Has [employer] right now and getting ready to switch to [employer]. Wants to wait until after switching insurances before she gets something covered.   No melena or brbpr.   No N/V, dysphagia.

## 2022-10-22 NOTE — Patient Instructions (Addendum)
You may use Tums as needed for breakthrough symptoms of reflux.  Please do not exceed times more than twice per day.  May resume famotidine as needed for reflux.  Continue to avoid your triggers for constipation.  Avoid gas-producing foods (eg, garlic, beans, cabbage, legumes, onions, broccoli, brussel sprouts, wheat, and potatoes).   After you switch her insurances, if you would like to proceed with resuming omeprazole please reach out to the office and we can send this in for you.  We can plan to follow-up in 3-4 months, sooner if needed  It was a pleasure to see you today. I want to create trusting relationships with patients. If you receive a survey regarding your visit,  I greatly appreciate you taking time to fill this out on paper or through your MyChart. I value your feedback.  Venetia Night, MSN, FNP-BC, AGACNP-BC [facility]

## 2022-10-22 NOTE — Progress Notes (Signed)
GI Office Note    Referring Provider: Celene Squibb, MD Primary Care Physician:  Celene Squibb, MD Primary Gastroenterologist: Elon Alas. Abbey Chatters, DO  Date:  10/22/2022  ID:  Jasmine Rhodes Jan 24, 1946, MRN [id number]   Chief Complaint   Chief Complaint  Patient presents with   Follow-up   History of Present Illness  Jasmine Rhodes is a 77 y.o. female with a history of asthma, HLD, HTN, sleep apnea presenting today for follow-up.  Last EGD October 2021:  -Benign-appearing esophageal stenosis s/p dilation -Gastritis s/p biopsy.  Duodenal polyposis s/p biopsy -Duodenal biopsies with nodular peptic duodenitis -Gastric biopsies with mild chronic gastritis and mild reactive gastropathy negative H. pylori.   Last colonoscopy in 2018 - internal hemorrhoids -diverticulosis in the sigmoid and descending colon -Redundant left colon. -Advise high-fiber diet and no repeat colonoscopy due to age.  Last office visit 04/23/2022.  Patient presented with intermittent toilet tissue hematochezia as well as some bloating without sharp abdominal pain.  Does have some intermittent cramping.  No unintentional weight loss or early satiety.  History of reflux but not taking omeprazole and having daily symptoms.  Using a tablespoon of vinegar daily and does not feel that is helping much.  Denied lack of appetite since the passing of her mother earlier last year.  Taking Pepto-Bismol as needed for reflux.  Had a recent spider bite and she was placed on antibiotics.  Family history of colon cancer and diverticulitis in her brother in his 70s.  Simethicone or Gas-X as needed and to stop using rectal.  She was advised to start Imodium 20 mg once daily prior to breakfast with consideration to restart omeprazole if famotidine not effective.  Hemorrhoid precautions discussed.  Due for repeat colonoscopy in 2025.  Today: Cheese is a constipation triggers. Usually having a BM once daily. No taking  anything for constipation. No abdominal pain. Still has gas discomfort. Will be walking and pass gas. Recent started back with the gas ex.    Reflux is not good. Over the counter famotidine was helping at times. Not currently thinking anything for her GERD. Has [employer] right now and getting ready to switch to [employer]. Wants to wait until after switching insurances before she gets something covered.    No melena or brbpr.    No N/V, dysphagia.     Current Outpatient Medications  Medication Sig Dispense Refill   acetaminophen (TYLENOL) 500 MG tablet Take 1,000 mg by mouth every 6 (six) hours as needed for moderate pain or headache.     albuterol (VENTOLIN HFA) 108 (90 Base) MCG/ACT inhaler Inhale 2 puffs into the lungs every 6 (six) hours as needed for wheezing or shortness of breath.     Ascorbic Acid (VITAMIN C) 1000 MG tablet Take 1,000 mg by mouth daily.     aspirin EC 81 MG tablet Take 81 mg by mouth daily.     atenolol (TENORMIN) 50 MG tablet Take 1 tablet by mouth daily.     bismuth subsalicylate (PEPTO BISMOL) 262 MG/15ML suspension Take 30 mLs by mouth every 6 (six) hours as needed for diarrhea or loose stools.     cetirizine (ZYRTEC) 10 MG tablet Take 10 mg by mouth daily as needed for allergies.      cholecalciferol (VITAMIN D3) 25 MCG (1000 UNIT) tablet Take 1,000 Units by mouth daily.     diclofenac sodium (VOLTAREN) 1 % GEL Apply 2 g topically daily as needed (for knee  pain).   3   ipratropium (ATROVENT) 0.06 % nasal spray Place 2 sprays into both nostrils 4 (four) times daily.     montelukast (SINGULAIR) 10 MG tablet Take 10 mg by mouth daily.     Multiple Vitamin (MULTIVITAMIN WITH MINERALS) TABS tablet Take 1 tablet by mouth daily.     omeprazole (PRILOSEC) 40 MG capsule Take 40 mg by mouth daily.     Polyethyl Glycol-Propyl Glycol (SYSTANE ULTRA OP) Apply 1 drop to eye 2 (two) times daily as needed (dry eye relief).      potassium chloride (MICRO-K) 10 MEQ CR capsule  Take 10 mEq by mouth daily.     rosuvastatin (CRESTOR) 20 MG tablet Take 1 tablet (20 mg total) by mouth daily. 90 tablet 3   triamcinolone cream (KENALOG) 0.1 % Apply 1 Application topically 2 (two) times daily. 30 g 0   triamterene-hydrochlorothiazide (DYAZIDE) 37.5-25 MG capsule Take 1 capsule by mouth daily.     b complex vitamins capsule Take 1 capsule by mouth once a week. (Patient not taking: Reported on 08/01/2022)     disulfiram (ANTABUSE) 250 MG tablet Take 250 mg by mouth as needed (alcohol relapse).  (Patient not taking: Reported on 08/01/2022)     tobramycin-dexamethasone (TOBRADEX) ophthalmic ointment Place 1 Application into both eyes as needed. (Patient not taking: Reported on 10/22/2022)     No current facility-administered medications for this visit.    Past Medical History:  Diagnosis Date   Asthma    DDD (degenerative disc disease), lumbar    Hyperlipidemia    Hypertension    Osteoarthritis    Sleep apnea     Past Surgical History:  Procedure Laterality Date   BALLOON DILATION N/A 05/10/2020   Procedure: BALLOON DILATION;  Surgeon: Eloise Harman, DO;  [facility];  Service: Endoscopy;  Laterality: N/A;   BIOPSY  05/10/2020   Procedure: BIOPSY;  Surgeon: Eloise Harman, DO;  [facility];  Service: Endoscopy;;   BREAST BIOPSY Right    BUNIONECTOMY Bilateral 2015   CARPAL TUNNEL RELEASE Right 03/30/2017   COLONOSCOPY     COLONOSCOPY N/A 04/25/2017   Procedure: COLONOSCOPY;  Surgeon: Danie Binder, MD;  [facility];  Service: Endoscopy;  Laterality: N/A;  1200   DG THUMB LEFT HAND     ESOPHAGOGASTRODUODENOSCOPY N/A 04/25/2017   Procedure: ESOPHAGOGASTRODUODENOSCOPY (EGD);  Surgeon: Danie Binder, MD;  [facility];  Service: Endoscopy;  Laterality: N/A;   ESOPHAGOGASTRODUODENOSCOPY (EGD) WITH PROPOFOL N/A 05/10/2020   Procedure: ESOPHAGOGASTRODUODENOSCOPY (EGD) WITH PROPOFOL;  Surgeon: Eloise Harman, DO;   [facility];  Service: Endoscopy;  Laterality: N/A;  2:00pm   SAVORY DILATION N/A 04/25/2017   Procedure: SAVORY DILATION;  Surgeon: Danie Binder, MD;  [facility];  Service: Endoscopy;  Laterality: N/A;   tonsils removal      Family History  Problem Relation Age of Onset   Hypertension Mother    Breast cancer Maternal Grandmother    Colon cancer Brother 75       Half-brother related by her mother    Allergies as of 10/22/2022 - Review Complete 10/22/2022  Allergen Reaction Noted   Sulfa antibiotics Rash 01/11/2017    Social History   Socioeconomic History   Marital status: Divorced    Spouse name: Not on file   Number of children: Not on file   Years of education: Not on file   Highest  education level: Not on file  Occupational History   Not on file  Tobacco Use   Smoking status: Every Day    Packs/day: 0.25    Years: 40.00    Additional pack years: 0.00    Total pack years: 10.00    Types: Cigarettes   Smokeless tobacco: Never  Vaping Use   Vaping Use: Never used  Substance and Sexual Activity   Alcohol use: Yes    Comment: occasional   Drug use: No   Sexual activity: Not on file  Other Topics Concern   Not on file  Social History Narrative   Not on file   Social Determinants of Health   Financial Resource Strain: Not on file  Food Insecurity: Not on file  Transportation Needs: Not on file  Physical Activity: Not on file  Stress: Not on file  Social Connections: Not on file     Review of Systems   Gen: Denies fever, chills, anorexia. Denies fatigue, weakness, weight loss.  CV: Denies chest pain, palpitations, syncope, peripheral edema, and claudication. Resp: Denies dyspnea at rest, cough, wheezing, coughing up blood, and pleurisy. GI: See HPI Derm: Denies rash, itching, dry skin Psych: Denies depression, anxiety, memory loss, confusion. No homicidal or suicidal ideation.  Heme: Denies bruising, bleeding, and enlarged  lymph nodes.   Physical Exam   BP 138/77 (BP Location: Right Arm, Patient Position: Sitting, Cuff Size: Normal)   Pulse 66   Temp (!) 97.3 F (36.3 C) (Temporal)   Ht 5\' 5"  (1.651 m)   Wt 144 lb 9.6 oz (65.6 kg)   SpO2 100%   BMI 24.06 kg/m   General:   Alert and oriented. No distress noted. Pleasant and cooperative.  Head:  Normocephalic and atraumatic. Eyes:  Conjuctiva clear without scleral icterus. Abdomen:  +BS, soft, non-tender and non-distended. No rebound or guarding. No HSM or masses noted. Rectal: deferred Msk:  Symmetrical without gross deformities. Normal posture. Extremities:  Without edema. Neurologic:  Alert and  oriented x4 Psych:  Alert and cooperative. Normal mood and affect.   Assessment  Jasmine Rhodes is a 77 y.o. female with a history of asthma, HLD, HTN, sleep apnea presenting today for follow-up.  Screening for colon cancer, family history of colon cancer: Last colonoscopy and 2018 with sigmoid and descending diverticulosis and redundant left colon.  Originally advised no repeat due to age but after further consultation with Dr. Abbey Chatters he recommended high screening surveillance colonoscopy in 2025.  GERD: Not well-controlled.  Having fairly frequent symptoms.  Close previously taking famotidine once to twice daily and that was helping at times but currently not taking any medications.  She would like to consider restarting PPI however she would like to see about switching her insurances back to [place] [employer] for better coverage prior to sending in prescription.  Advised GERD diet.  Constipation, bloating, rectal bleeding: Constipation fairly well controlled.  Having about 1 bowel movement per day and not taking anything for this.  She has been avoiding her typical constipation triggers such as cheese.  Continues to have regular bloating and frequent gassiness however.  Just recently started back taking Gas-X and is having some relief.  Advised to  avoid, gas-producing foods, examples provided.  PLAN   Continue Gas-X 2 tablets with each dose or Phazyme. Avoid, gas-producing foods. Use famotidine as needed May use Tums as needed for severe symptoms, if needed on a daily basis we should consider PPI. Call back after changing insurances to  submit for PPI for uncontrolled GERD May be a candidate for TIF.  Will discuss at follow-up. Will discuss need for repeat colonoscopy next year. Follow-up in 3-4 months, sooner if needed    Venetia Night, MSN, FNP-BC, AGACNP-BC [facility]

## 2022-11-19 ENCOUNTER — Encounter: Payer: Self-pay | Admitting: Emergency Medicine

## 2022-11-26 ENCOUNTER — Other Ambulatory Visit: Payer: Self-pay | Admitting: Emergency Medicine

## 2022-11-26 DIAGNOSIS — E7800 Pure hypercholesterolemia, unspecified: Secondary | ICD-10-CM

## 2022-11-30 ENCOUNTER — Other Ambulatory Visit: Payer: Self-pay

## 2022-11-30 DIAGNOSIS — E7800 Pure hypercholesterolemia, unspecified: Secondary | ICD-10-CM

## 2022-11-30 LAB — LIPID PANEL
Chol/HDL Ratio: 1.7 ratio (ref 0.0–4.4)
Cholesterol, Total: 170 mg/dL (ref 100–199)
HDL: 100 mg/dL (ref >39)
LDL Chol Calc (NIH): 55 mg/dL (ref 0–99)
Triglycerides: 82 mg/dL (ref 0–149)
VLDL Cholesterol Cal: 15 mg/dL (ref 5–40)

## 2022-12-13 DIAGNOSIS — M25561 Pain in right knee: Secondary | ICD-10-CM | POA: Diagnosis not present

## 2023-01-04 ENCOUNTER — Encounter: Payer: Self-pay | Admitting: Internal Medicine

## 2023-01-17 DIAGNOSIS — L811 Chloasma: Secondary | ICD-10-CM | POA: Diagnosis not present

## 2023-01-18 DIAGNOSIS — I10 Essential (primary) hypertension: Secondary | ICD-10-CM | POA: Diagnosis not present

## 2023-01-18 DIAGNOSIS — E785 Hyperlipidemia, unspecified: Secondary | ICD-10-CM | POA: Diagnosis not present

## 2023-01-18 DIAGNOSIS — R7301 Impaired fasting glucose: Secondary | ICD-10-CM | POA: Diagnosis not present

## 2023-01-23 DIAGNOSIS — I10 Essential (primary) hypertension: Secondary | ICD-10-CM | POA: Diagnosis not present

## 2023-01-23 DIAGNOSIS — J302 Other seasonal allergic rhinitis: Secondary | ICD-10-CM | POA: Diagnosis not present

## 2023-01-23 DIAGNOSIS — Z0001 Encounter for general adult medical examination with abnormal findings: Secondary | ICD-10-CM | POA: Diagnosis not present

## 2023-01-23 DIAGNOSIS — Z0000 Encounter for general adult medical examination without abnormal findings: Secondary | ICD-10-CM | POA: Diagnosis not present

## 2023-01-23 DIAGNOSIS — M25561 Pain in right knee: Secondary | ICD-10-CM | POA: Diagnosis not present

## 2023-01-23 DIAGNOSIS — M545 Low back pain, unspecified: Secondary | ICD-10-CM | POA: Diagnosis not present

## 2023-01-23 DIAGNOSIS — E782 Mixed hyperlipidemia: Secondary | ICD-10-CM | POA: Diagnosis not present

## 2023-01-23 DIAGNOSIS — R600 Localized edema: Secondary | ICD-10-CM | POA: Diagnosis not present

## 2023-01-23 DIAGNOSIS — N3281 Overactive bladder: Secondary | ICD-10-CM | POA: Diagnosis not present

## 2023-01-23 DIAGNOSIS — K117 Disturbances of salivary secretion: Secondary | ICD-10-CM | POA: Diagnosis not present

## 2023-01-23 DIAGNOSIS — F17200 Nicotine dependence, unspecified, uncomplicated: Secondary | ICD-10-CM | POA: Diagnosis not present

## 2023-01-24 DIAGNOSIS — M1711 Unilateral primary osteoarthritis, right knee: Secondary | ICD-10-CM | POA: Diagnosis not present

## 2023-02-08 DIAGNOSIS — R600 Localized edema: Secondary | ICD-10-CM | POA: Diagnosis not present

## 2023-02-08 DIAGNOSIS — W57XXXD Bitten or stung by nonvenomous insect and other nonvenomous arthropods, subsequent encounter: Secondary | ICD-10-CM | POA: Diagnosis not present

## 2023-02-08 DIAGNOSIS — S61451D Open bite of right hand, subsequent encounter: Secondary | ICD-10-CM | POA: Diagnosis not present

## 2023-02-08 DIAGNOSIS — I10 Essential (primary) hypertension: Secondary | ICD-10-CM | POA: Diagnosis not present

## 2023-02-08 DIAGNOSIS — F17210 Nicotine dependence, cigarettes, uncomplicated: Secondary | ICD-10-CM | POA: Diagnosis not present

## 2023-02-08 DIAGNOSIS — Z79899 Other long term (current) drug therapy: Secondary | ICD-10-CM | POA: Diagnosis not present

## 2023-02-08 DIAGNOSIS — S20469D Insect bite (nonvenomous) of unspecified back wall of thorax, subsequent encounter: Secondary | ICD-10-CM | POA: Diagnosis not present

## 2023-02-08 DIAGNOSIS — W57XXXA Bitten or stung by nonvenomous insect and other nonvenomous arthropods, initial encounter: Secondary | ICD-10-CM | POA: Diagnosis not present

## 2023-02-08 DIAGNOSIS — Z713 Dietary counseling and surveillance: Secondary | ICD-10-CM | POA: Diagnosis not present

## 2023-02-08 DIAGNOSIS — G4733 Obstructive sleep apnea (adult) (pediatric): Secondary | ICD-10-CM | POA: Diagnosis not present

## 2023-02-18 DIAGNOSIS — G4730 Sleep apnea, unspecified: Secondary | ICD-10-CM | POA: Diagnosis not present

## 2023-03-28 ENCOUNTER — Other Ambulatory Visit: Payer: Self-pay | Admitting: Internal Medicine

## 2023-03-28 DIAGNOSIS — Z1231 Encounter for screening mammogram for malignant neoplasm of breast: Secondary | ICD-10-CM

## 2023-04-02 ENCOUNTER — Other Ambulatory Visit: Payer: Self-pay | Admitting: Cardiovascular Disease

## 2023-04-03 ENCOUNTER — Encounter: Payer: Self-pay | Admitting: Emergency Medicine

## 2023-04-03 ENCOUNTER — Other Ambulatory Visit: Payer: Self-pay

## 2023-04-03 ENCOUNTER — Ambulatory Visit
Admission: EM | Admit: 2023-04-03 | Discharge: 2023-04-03 | Disposition: A | Discharge status: Home / Self Care | Payer: Medicare Other | Attending: Nurse Practitioner | Admitting: Nurse Practitioner

## 2023-04-03 DIAGNOSIS — R0981 Nasal congestion: Secondary | ICD-10-CM | POA: Insufficient documentation

## 2023-04-03 DIAGNOSIS — Z8709 Personal history of other diseases of the respiratory system: Secondary | ICD-10-CM | POA: Diagnosis not present

## 2023-04-03 DIAGNOSIS — R059 Cough, unspecified: Secondary | ICD-10-CM | POA: Insufficient documentation

## 2023-04-03 DIAGNOSIS — Z1152 Encounter for screening for COVID-19: Secondary | ICD-10-CM | POA: Diagnosis not present

## 2023-04-03 DIAGNOSIS — B9789 Other viral agents as the cause of diseases classified elsewhere: Secondary | ICD-10-CM | POA: Diagnosis not present

## 2023-04-03 DIAGNOSIS — J069 Acute upper respiratory infection, unspecified: Secondary | ICD-10-CM | POA: Diagnosis not present

## 2023-04-03 DIAGNOSIS — F17210 Nicotine dependence, cigarettes, uncomplicated: Secondary | ICD-10-CM | POA: Insufficient documentation

## 2023-04-03 LAB — POCT INFLUENZA A/B
Influenza A, POC: NEGATIVE
Influenza B, POC: NEGATIVE

## 2023-04-03 MED ORDER — DEXAMETHASONE SODIUM PHOSPHATE 10 MG/ML IJ SOLN
10.0000 mg | INTRAMUSCULAR | Status: AC
  Administered 2023-04-03: 10 mg via INTRAMUSCULAR

## 2023-04-03 NOTE — ED Triage Notes (Addendum)
Pt reports runny nose, sneezing, nasal congestion, sore throat, body aches for last several days.

## 2023-04-03 NOTE — Discharge Instructions (Addendum)
You were given an injection of Decadron 10 mg. COVID test is pending.  You will be contacted if the pending test result is positive. Take medication as directed. Continue Singulair.  As discussed, would like for you to begin taking Zyrtec each morning daily.   You may pick up Coricidin HBP over-the-counter for your cough symptoms. Increase fluids and get plenty of rest. May take over-the-counter Tylenol as needed for pain, fever, or general discomfort. Recommend normal saline nasal spray to help with nasal congestion throughout the day. For your cough, it may be helpful to use a humidifier at bedtime during sleep. If symptoms do not improve over the next 7 to 10 days, or if they suddenly worsen, please follow-up in this clinic or with your primary care physician for further evaluation. Follow-up as needed.

## 2023-04-03 NOTE — ED Provider Notes (Addendum)
[facility]    CSN: [id number] Arrival date & time: 04/03/23  1750      History   Chief Complaint Chief Complaint  Patient presents with   Nasal Congestion    HPI Jasmine Rhodes is a 77 y.o. female.   The history is provided by the patient.   Patient presents with a 3-day history of bodyaches, nasal congestion, runny nose, sneezing, sore throat, and cough.  Patient denies fever, chills, headache, ear pain, wheezing, difficulty breathing, chest pain, abdominal pain, nausea, vomiting, or diarrhea.  Patient reports she has been taking her daily allergy medication.  She does have a history of chronic rhinitis and asthma per review of her chart. History of asthma and allergies. No known sick contacts recently.   Past Medical History:  Diagnosis Date   Asthma    DDD (degenerative disc disease), lumbar    Hyperlipidemia    Hypertension    Osteoarthritis    Sleep apnea     Patient Active Problem List   Diagnosis Date Noted   Coronary artery calcification seen on CT scan 08/03/2022   Centrilobular emphysema (HCC) 08/03/2022   Bleeding internal hemorrhoids 07/06/2020   GERD (gastroesophageal reflux disease) 04/20/2020   Abdominal bloating 04/20/2020   Gastritis and gastroduodenitis    History of colonic polyps 03/11/2017   Chronic RLQ pain 03/11/2017   Abdominal pain, epigastric 03/11/2017   Dysphagia 03/11/2017   Essential hypertension 01/11/2017   Overweight 01/11/2017   Hypercholesterolemia 01/11/2017   Allergic rhinitis 01/11/2017   Mild intermittent asthma 01/11/2017   Osteoarthritis of both knees 01/11/2017   Carpal tunnel syndrome, right 01/11/2017   Chest pain 01/11/2017   Stress 01/11/2017   Lumbago with sciatica, right side 01/11/2017    Past Surgical History:  Procedure Laterality Date   BALLOON DILATION N/A 05/10/2020   Procedure: BALLOON DILATION;  Surgeon: Lanelle Bal, DO;  [facility];  Service: Endoscopy;   Laterality: N/A;   BIOPSY  05/10/2020   Procedure: BIOPSY;  Surgeon: Lanelle Bal, DO;  [facility];  Service: Endoscopy;;   BREAST BIOPSY Right    BUNIONECTOMY Bilateral 2015   CARPAL TUNNEL RELEASE Right 03/30/2017   COLONOSCOPY     COLONOSCOPY N/A 04/25/2017   Procedure: COLONOSCOPY;  Surgeon: West Bali, MD;  [facility]: [facility];  Service: Endoscopy;  Laterality: N/A;  1200   DG THUMB LEFT HAND     ESOPHAGOGASTRODUODENOSCOPY N/A 04/25/2017   Procedure: ESOPHAGOGASTRODUODENOSCOPY (EGD);  Surgeon: West Bali, MD;  [facility];  Service: Endoscopy;  Laterality: N/A;   ESOPHAGOGASTRODUODENOSCOPY (EGD) WITH PROPOFOL N/A 05/10/2020   Procedure: ESOPHAGOGASTRODUODENOSCOPY (EGD) WITH PROPOFOL;  Surgeon: Lanelle Bal, DO;  [facility];  Service: Endoscopy;  Laterality: N/A;  2:00pm   SAVORY DILATION N/A 04/25/2017   Procedure: SAVORY DILATION;  Surgeon: West Bali, MD;  [facility];  Service: Endoscopy;  Laterality: N/A;   tonsils removal      OB History   No obstetric history on file.      Home Medications    Prior to Admission medications   Medication Sig Start Date End Date Taking? Authorizing Provider  acetaminophen (TYLENOL) 500 MG tablet Take 1,000 mg by mouth every 6 (six) hours as needed for moderate pain or headache.    [provider]  albuterol (VENTOLIN HFA) 108 (90 Base) MCG/ACT inhaler Inhale 2 puffs into the lungs every 6 (six) hours as needed  for wheezing or shortness of breath.    [provider]  Ascorbic Acid (VITAMIN C) 1000 MG tablet Take 1,000 mg by mouth daily.    [provider]  aspirin EC 81 MG tablet Take 81 mg by mouth daily.    [provider]  atenolol (TENORMIN) 50 MG tablet Take 1 tablet by mouth daily.    [provider]  b complex vitamins capsule Take 1 capsule by mouth once a week. Patient not taking: Reported on 08/01/2022     [provider]  bismuth subsalicylate (PEPTO BISMOL) 262 MG/15ML suspension Take 30 mLs by mouth every 6 (six) hours as needed for diarrhea or loose stools.    [provider]  cetirizine (ZYRTEC) 10 MG tablet Take 10 mg by mouth daily as needed for allergies.     [provider]  cholecalciferol (VITAMIN D3) 25 MCG (1000 UNIT) tablet Take 1,000 Units by mouth daily.    [provider]  diclofenac sodium (VOLTAREN) 1 % GEL Apply 2 g topically daily as needed (for knee pain).  03/19/18   [provider]  disulfiram (ANTABUSE) 250 MG tablet Take 250 mg by mouth as needed (alcohol relapse).  Patient not taking: Reported on 08/01/2022 11/16/19   [provider]  ipratropium (ATROVENT) 0.06 % nasal spray Place 2 sprays into both nostrils 4 (four) times daily. 07/10/22   [provider]  montelukast (SINGULAIR) 10 MG tablet Take 10 mg by mouth daily. 10/30/21   [provider]  Multiple Vitamin (MULTIVITAMIN WITH MINERALS) TABS tablet Take 1 tablet by mouth daily.    [provider]  omeprazole (PRILOSEC) 40 MG capsule Take 40 mg by mouth daily.    [provider]  Polyethyl Glycol-Propyl Glycol (SYSTANE ULTRA OP) Apply 1 drop to eye 2 (two) times daily as needed (dry eye relief).     [provider]  potassium chloride (MICRO-K) 10 MEQ CR capsule Take 10 mEq by mouth daily. 05/11/22   [provider]  rosuvastatin (CRESTOR) 20 MG tablet TAKE 1 TABLET(20 MG) BY MOUTH DAILY 04/02/23   Croitoru, Mihai, MD  tobramycin-dexamethasone (TOBRADEX) ophthalmic ointment Place 1 Application into both eyes as needed. Patient not taking: Reported on 10/22/2022 10/12/13   [provider]  triamcinolone cream (KENALOG) 0.1 % Apply 1 Application topically 2 (two) times daily. 05/11/22   Reianna Batdorf-Warren, Sadie Haber, NP  triamterene-hydrochlorothiazide (DYAZIDE) 37.5-25 MG capsule Take 1 capsule by mouth daily.     [provider]    Family History Family History  Problem Relation Age of Onset   Hypertension Mother    Breast cancer Maternal Grandmother    Colon cancer Brother 68       Half-brother related by her mother    Social History Social History   Tobacco Use   Smoking status: Every Day    Current packs/day: 0.25    Average packs/day: 0.3 packs/day for 40.0 years (10.0 ttl pk-yrs)    Types: Cigarettes   Smokeless tobacco: Never  Vaping Use   Vaping status: Never Used  Substance Use Topics   Alcohol use: Yes    Comment: occasional   Drug use: No     Allergies   Sulfa antibiotics   Review of Systems Review of Systems Per HPI  Physical Exam Triage Vital Signs ED Triage Vitals [04/03/23 1848]  Encounter Vitals Group     BP 136/86     Systolic BP Percentile      Diastolic  BP Percentile      Pulse Rate 78     Resp 20     Temp 98.8 F (37.1 C)     Temp Source Oral     SpO2 97 %     Weight      Height      Head Circumference      Peak Flow      Pain Score 5     Pain Loc      Pain Education      Exclude from Growth Chart    No data found.  Updated Vital Signs BP 136/86 (BP Location: Right Arm)   Pulse 78   Temp 98.8 F (37.1 C) (Oral)   Resp 20   SpO2 97%   Visual Acuity Right Eye Distance:   Left Eye Distance:   Bilateral Distance:    Right Eye Near:   Left Eye Near:    Bilateral Near:     Physical Exam Vitals and nursing note reviewed.  Constitutional:      General: She is not in acute distress.    Appearance: Normal appearance.  HENT:     Head: Normocephalic.     Right Ear: Tympanic membrane, ear canal and external ear normal.     Left Ear: Tympanic membrane, ear canal and external ear normal.     Nose: Congestion present.     Mouth/Throat:     Mouth: Mucous membranes are moist.     Pharynx: Posterior oropharyngeal erythema present.     Comments: Cobblestoning present to posterior oropharynx Eyes:     Extraocular Movements:  Extraocular movements intact.     Conjunctiva/sclera: Conjunctivae normal.     Pupils: Pupils are equal, round, and reactive to light.  Cardiovascular:     Rate and Rhythm: Normal rate.     Pulses: Normal pulses.     Heart sounds: Normal heart sounds.  Pulmonary:     Effort: Pulmonary effort is normal.     Breath sounds: Normal breath sounds.  Abdominal:     General: Bowel sounds are normal.     Palpations: Abdomen is soft.  Musculoskeletal:     Cervical back: Normal range of motion.  Lymphadenopathy:     Cervical: No cervical adenopathy.  Skin:    General: Skin is warm and dry.  Neurological:     General: No focal deficit present.     Mental Status: She is alert and oriented to person, place, and time.  Psychiatric:        Mood and Affect: Mood normal.        Behavior: Behavior normal.      UC Treatments / Results  Labs (all labs ordered are listed, but only abnormal results are displayed) Labs Reviewed  SARS CORONAVIRUS 2 (TAT 6-24 HRS)  POCT INFLUENZA A/B    EKG   Radiology No results found.  Procedures Procedures (including critical care time)  Medications Ordered in UC Medications  dexamethasone (DECADRON) injection 10 mg (10 mg Intramuscular Given 04/03/23 1938)    Initial Impression / Assessment and Plan / UC Course  I have reviewed the triage vital signs and the nursing notes.  Pertinent labs & imaging results that were available during my care of the patient were reviewed by me and considered in my medical decision making (see chart for details).  The patient is well-appearing, she is in no acute distress, vital signs are stable.  Influenza test is negative, COVID test is pending.  Patient  is able to receive Paxlovid if her COVID test is positive (patient will need to hold Crestor for 2 weeks if she takes Paxlovid).  Decadron 10 mg IM administered for history of chronic rhinitis.  Fluticasone 50 mcg nasal spray prescribed for nasal congestion and runny  nose.  Patient will continue Zyrtec and Singulair as discussed.  Did not supportive care recommendations were provided and discussed with the patient to include use of normal saline nasal spray for nasal congestion and runny nose throughout the day, over-the-counter Tylenol for pain or discomfort, and use of over-the-counter Coricidin HBP to help with cough.  Patient was given strict follow-up precautions.  Patient is in agreement with this plan of care and verbalizes understanding.  All questions were answered.  Patient stable for discharge.  Final Clinical Impressions(s) / UC Diagnoses   Final diagnoses:  Nasal congestion  Viral upper respiratory tract infection with cough  History of chronic rhinitis     Discharge Instructions      You were given an injection of Decadron 10 mg. COVID test is pending.  You will be contacted if the pending test result is positive. Take medication as directed. Continue Singulair.  As discussed, would like for you to begin taking Zyrtec each morning daily.   You may pick up Coricidin HBP over-the-counter for your cough symptoms. Increase fluids and get plenty of rest. May take over-the-counter Tylenol as needed for pain, fever, or general discomfort. Recommend normal saline nasal spray to help with nasal congestion throughout the day. For your cough, it may be helpful to use a humidifier at bedtime during sleep. If symptoms do not improve over the next 7 to 10 days, or if they suddenly worsen, please follow-up in this clinic or with your primary care physician for further evaluation. Follow-up as needed.      ED Prescriptions   None    PDMP not reviewed this encounter.   Abran Cantor, NP 04/03/23 1953    Abran Cantor, NP 04/03/23 1953

## 2023-04-04 LAB — SARS CORONAVIRUS 2 (TAT 6-24 HRS): SARS Coronavirus 2: NEGATIVE

## 2023-04-19 ENCOUNTER — Ambulatory Visit
Admission: RE | Admit: 2023-04-19 | Discharge: 2023-04-19 | Disposition: A | Discharge status: Home / Self Care | Payer: Medicare Other | Source: Ambulatory Visit | Attending: Internal Medicine | Admitting: Internal Medicine

## 2023-04-19 DIAGNOSIS — Z1231 Encounter for screening mammogram for malignant neoplasm of breast: Secondary | ICD-10-CM | POA: Diagnosis not present

## 2023-04-25 DIAGNOSIS — L570 Actinic keratosis: Secondary | ICD-10-CM | POA: Diagnosis not present

## 2023-04-25 DIAGNOSIS — L811 Chloasma: Secondary | ICD-10-CM | POA: Diagnosis not present

## 2023-05-07 ENCOUNTER — Ambulatory Visit: Payer: Medicare Other | Admitting: Gastroenterology

## 2023-05-19 NOTE — Progress Notes (Deleted)
GI Office Note    Referring Provider: Benita Stabile, MD Primary Care Physician:  Benita Stabile, MD Primary Gastroenterologist: Hennie Duos. Marletta Lor, DO  Date:  05/19/2023  ID:  Jasmine Rhodes, Jasmine Rhodes 1945/08/25, MRN [id number]   Chief Complaint   No chief complaint on file.   History of Present Illness  Jasmine Rhodes is a 77 y.o. female with a history of *** presenting today with complaint of   Last EGD October 2021:  -Benign-appearing esophageal stenosis s/p dilation -Gastritis s/p biopsy.  Duodenal polyposis s/p biopsy -Duodenal biopsies with nodular peptic duodenitis -Gastric biopsies with mild chronic gastritis and mild reactive gastropathy negative H. pylori.   Last colonoscopy in 2018 - internal hemorrhoids -diverticulosis in the sigmoid and descending colon -Redundant left colon. -Advise high-fiber diet and no repeat colonoscopy due to age.   Office visit 04/23/2022.  Patient presented with intermittent toilet tissue hematochezia as well as some bloating without sharp abdominal pain.  Does have some intermittent cramping.  No unintentional weight loss or early satiety.  History of reflux but not taking omeprazole and having daily symptoms.  Using a tablespoon of vinegar daily and does not feel that is helping much.  Denied lack of appetite since the passing of her mother earlier last year.  Taking Pepto-Bismol as needed for reflux.  Had a recent spider bite and she was placed on antibiotics.  Family history of colon cancer and diverticulitis in her brother in his 16s.  Simethicone or Gas-X as needed and to stop using rectal.  She was advised to start Imodium 20 mg once daily prior to breakfast with consideration to restart omeprazole if famotidine not effective.  Hemorrhoid precautions discussed.  Due for repeat colonoscopy in 2025.  Last office visit 10/22/22. ***  Today:   Current Outpatient Medications  Medication Sig Dispense Refill   acetaminophen (TYLENOL)  500 MG tablet Take 1,000 mg by mouth every 6 (six) hours as needed for moderate pain or headache.     albuterol (VENTOLIN HFA) 108 (90 Base) MCG/ACT inhaler Inhale 2 puffs into the lungs every 6 (six) hours as needed for wheezing or shortness of breath.     Ascorbic Acid (VITAMIN C) 1000 MG tablet Take 1,000 mg by mouth daily.     aspirin EC 81 MG tablet Take 81 mg by mouth daily.     atenolol (TENORMIN) 50 MG tablet Take 1 tablet by mouth daily.     b complex vitamins capsule Take 1 capsule by mouth once a week. (Patient not taking: Reported on 08/01/2022)     bismuth subsalicylate (PEPTO BISMOL) 262 MG/15ML suspension Take 30 mLs by mouth every 6 (six) hours as needed for diarrhea or loose stools.     cetirizine (ZYRTEC) 10 MG tablet Take 10 mg by mouth daily as needed for allergies.      cholecalciferol (VITAMIN D3) 25 MCG (1000 UNIT) tablet Take 1,000 Units by mouth daily.     diclofenac sodium (VOLTAREN) 1 % GEL Apply 2 g topically daily as needed (for knee pain).   3   disulfiram (ANTABUSE) 250 MG tablet Take 250 mg by mouth as needed (alcohol relapse).  (Patient not taking: Reported on 08/01/2022)     ipratropium (ATROVENT) 0.06 % nasal spray Place 2 sprays into both nostrils 4 (four) times daily.     montelukast (SINGULAIR) 10 MG tablet Take 10 mg by mouth daily.     Multiple Vitamin (MULTIVITAMIN WITH MINERALS) TABS tablet Take 1  tablet by mouth daily.     omeprazole (PRILOSEC) 40 MG capsule Take 40 mg by mouth daily.     Polyethyl Glycol-Propyl Glycol (SYSTANE ULTRA OP) Apply 1 drop to eye 2 (two) times daily as needed (dry eye relief).      potassium chloride (MICRO-K) 10 MEQ CR capsule Take 10 mEq by mouth daily.     rosuvastatin (CRESTOR) 20 MG tablet TAKE 1 TABLET(20 MG) BY MOUTH DAILY 90 tablet 3   tobramycin-dexamethasone (TOBRADEX) ophthalmic ointment Place 1 Application into both eyes as needed. (Patient not taking: Reported on 10/22/2022)     triamcinolone cream (KENALOG) 0.1 % Apply  1 Application topically 2 (two) times daily. 30 g 0   triamterene-hydrochlorothiazide (DYAZIDE) 37.5-25 MG capsule Take 1 capsule by mouth daily.     No current facility-administered medications for this visit.    Past Medical History:  Diagnosis Date   Asthma    DDD (degenerative disc disease), lumbar    Hyperlipidemia    Hypertension    Osteoarthritis    Sleep apnea     Past Surgical History:  Procedure Laterality Date   BALLOON DILATION N/A 05/10/2020   Procedure: BALLOON DILATION;  Surgeon: Lanelle Bal, DO;  [facility];  Service: Endoscopy;  Laterality: N/A;   BIOPSY  05/10/2020   Procedure: BIOPSY;  Surgeon: Lanelle Bal, DO;  [facility];  Service: Endoscopy;;   BREAST BIOPSY Right    BUNIONECTOMY Bilateral 2015   CARPAL TUNNEL RELEASE Right 03/30/2017   COLONOSCOPY     COLONOSCOPY N/A 04/25/2017   Procedure: COLONOSCOPY;  Surgeon: West Bali, MD;  [facility];  Service: Endoscopy;  Laterality: N/A;  1200   DG THUMB LEFT HAND     ESOPHAGOGASTRODUODENOSCOPY N/A 04/25/2017   Procedure: ESOPHAGOGASTRODUODENOSCOPY (EGD);  Surgeon: West Bali, MD;  [facility];  Service: Endoscopy;  Laterality: N/A;   ESOPHAGOGASTRODUODENOSCOPY (EGD) WITH PROPOFOL N/A 05/10/2020   Procedure: ESOPHAGOGASTRODUODENOSCOPY (EGD) WITH PROPOFOL;  Surgeon: Lanelle Bal, DO;  [facility];  Service: Endoscopy;  Laterality: N/A;  2:00pm   SAVORY DILATION N/A 04/25/2017   Procedure: SAVORY DILATION;  Surgeon: West Bali, MD;  [facility];  Service: Endoscopy;  Laterality: N/A;   tonsils removal      Family History  Problem Relation Age of Onset   Hypertension Mother    Breast cancer Maternal Grandmother    Colon cancer Brother 18       Half-brother related by her mother    Allergies as of 05/20/2023 - Review Complete 04/03/2023  Allergen Reaction Noted   Sulfa antibiotics Rash 01/11/2017     Social History   Socioeconomic History   Marital status: Divorced    Spouse name: Not on file   Number of children: Not on file   Years of education: Not on file   Highest education level: Not on file  Occupational History   Not on file  Tobacco Use   Smoking status: Every Day    Current packs/day: 0.25    Average packs/day: 0.3 packs/day for 40.0 years (10.0 ttl pk-yrs)    Types: Cigarettes   Smokeless tobacco: Never  Vaping Use   Vaping status: Never Used  Substance and Sexual Activity   Alcohol use: Yes    Comment: occasional   Drug use: No   Sexual activity: Not on file  Other Topics Concern   Not on file  Social History  Narrative   Not on file   Social Determinants of Health   Financial Resource Strain: Not on file  Food Insecurity: Not on file  Transportation Needs: Not on file  Physical Activity: Not on file  Stress: Not on file  Social Connections: Not on file     Review of Systems   Gen: Denies fever, chills, anorexia. Denies fatigue, weakness, weight loss.  CV: Denies chest pain, palpitations, syncope, peripheral edema, and claudication. Resp: Denies dyspnea at rest, cough, wheezing, coughing up blood, and pleurisy. GI: See HPI Derm: Denies rash, itching, dry skin Psych: Denies depression, anxiety, memory loss, confusion. No homicidal or suicidal ideation.  Heme: Denies bruising, bleeding, and enlarged lymph nodes.   Physical Exam   There were no vitals taken for this visit.  General:   Alert and oriented. No distress noted. Pleasant and cooperative.  Head:  Normocephalic and atraumatic. Eyes:  Conjuctiva clear without scleral icterus. Mouth:  Oral mucosa pink and moist. Good dentition. No lesions. Lungs:  Clear to auscultation bilaterally. No wheezes, rales, or rhonchi. No distress.  Heart:  S1, S2 present without murmurs appreciated.  Abdomen:  +BS, soft, non-tender and non-distended. No rebound or guarding. No HSM or masses  noted. Rectal: *** Msk:  Symmetrical without gross deformities. Normal posture. Extremities:  Without edema. Neurologic:  Alert and  oriented x4 Psych:  Alert and cooperative. Normal mood and affect.   Assessment  Crista Deakin is a 77 y.o. female with a history of *** presenting today with   GERD:  Constipation, rectal bleeding, bloating:   PLAN   ***     Brooke Bonito, MSN, FNP-BC, AGACNP-BC [facility]

## 2023-05-20 ENCOUNTER — Ambulatory Visit: Payer: Medicare Other | Admitting: Gastroenterology

## 2023-05-20 ENCOUNTER — Encounter: Payer: Self-pay | Admitting: Gastroenterology

## 2023-06-06 ENCOUNTER — Ambulatory Visit: Payer: Medicare Other | Admitting: Gastroenterology

## 2023-06-06 ENCOUNTER — Encounter: Payer: Self-pay | Admitting: Gastroenterology

## 2023-06-06 VITALS — BP 127/77 | HR 86 | Temp 98.4°F | Ht 65.0 in | Wt 139.8 lb

## 2023-06-06 DIAGNOSIS — K219 Gastro-esophageal reflux disease without esophagitis: Secondary | ICD-10-CM

## 2023-06-06 DIAGNOSIS — R140 Abdominal distension (gaseous): Secondary | ICD-10-CM | POA: Diagnosis not present

## 2023-06-06 DIAGNOSIS — R142 Eructation: Secondary | ICD-10-CM | POA: Diagnosis not present

## 2023-06-06 DIAGNOSIS — R143 Flatulence: Secondary | ICD-10-CM

## 2023-06-06 DIAGNOSIS — K5900 Constipation, unspecified: Secondary | ICD-10-CM

## 2023-06-06 MED ORDER — OMEPRAZOLE 40 MG PO CPDR
40.0000 mg | DELAYED_RELEASE_CAPSULE | Freq: Two times a day (BID) | ORAL | 3 refills | Status: AC
Start: 2023-06-06 — End: ?

## 2023-06-06 NOTE — Patient Instructions (Addendum)
Start taking omeprazole 40 mg twice daily.  Will do this for 4 weeks and then reduce to once daily.  I will send in a 90-day prescription.  As we discussed please call me with a progress report in about 4 to 6 weeks and if you are continuing to have a lot of burping or passing gas from below then we can submit for the breath test to assess for any imbalance of enzymes or bacteria within the small bowel.  You may continue to use Gas-X, Beano, or Phazyme as needed.  I would like for you to start a daily fiber supplement such as Benefiber daily for your constipation.  I would recommend 2-3 teaspoons daily, and you can increase to twice daily after 3-4 weeks if you are able to tolerate it.  Follow-up 4 months, sooner if needed.  I hope you have a wonderful Thanksgiving and Christmas!  It was a pleasure to see you today. I want to create trusting relationships with patients. If you receive a survey regarding your visit,  I greatly appreciate you taking time to fill this out on paper or through your MyChart. I value your feedback.  Brooke Bonito, MSN, FNP-BC, AGACNP-BC [facility]

## 2023-06-06 NOTE — Progress Notes (Signed)
GI Office Note    Referring Provider: Benita Stabile, MD Primary Care Physician:  Benita Stabile, MD Primary Gastroenterologist: Hennie Duos. Marletta Lor, DO  Date:  06/06/2023  ID:  Jasmine Rhodes, Jasmine Rhodes 22-Jan-1946, MRN [id number]   Chief Complaint   Chief Complaint  Patient presents with   Follow-up    Still having a lot of gas and bloating. Also having issues with reflux.   History of Present Illness  Jasmine Rhodes is a 77 y.o. female with a history of GERD, asthma, HLD, HTN, sleep apnea presenting today with complaint of acid reflux, burping, and flatulence.  Last EGD October 2021:  -Benign-appearing esophageal stenosis s/p dilation -Gastritis s/p biopsy.  Duodenal polyposis s/p biopsy -Duodenal biopsies with nodular peptic duodenitis -Gastric biopsies with mild chronic gastritis and mild reactive gastropathy negative H. pylori.   Last colonoscopy in 2018 - internal hemorrhoids -diverticulosis in the sigmoid and descending colon -Redundant left colon. -Advise high-fiber diet and no repeat colonoscopy due to age.   Office visit 04/23/2022.  Patient presented with intermittent toilet tissue hematochezia as well as some bloating without sharp abdominal pain.  Does have some intermittent cramping.  No unintentional weight loss or early satiety.  History of reflux but not taking omeprazole and having daily symptoms.  Using a tablespoon of vinegar daily and does not feel that is helping much.  Denied lack of appetite since the passing of her mother earlier last year.  Taking Pepto-Bismol as needed for reflux.  Had a recent spider bite and she was placed on antibiotics.  Family history of colon cancer and diverticulitis in her brother in his 79s.  Simethicone or Gas-X as needed and to stop using rectal.  She was advised to start Imodium 20 mg once daily prior to breakfast with consideration to restart omeprazole if famotidine not effective.  Hemorrhoid precautions discussed.  Due for  repeat colonoscopy in 2025.   Last office visit 10/22/2022.  Constipation fairly well-controlled.  Reflux not controlled but was switching from at [facility] and would prefer to wait to start any medication until this occurs.  Advised her to manage with over-the-counter famotidine and Tums as needed and let me know when insurance to switch therefore we can start PPI.  Advised to continue Gas-X 2 tablets when needed or Phazyme.  Advised to avoid gas producing foods, use famotidine as needed and Tums for severe symptoms.  Advised if she needed this on a more frequent basis then we need to consider PPI, she was advised to let us know when insurance is switched so we could submit for PPI.  Unsure if she would be a TIPS candidate but we could discuss this at next visit.   Today:  Having daily reflux symptoms - frequent burping and burning. No abdominal pain. No real distention. No nausea or vomiting. No dysphagia. Appetite could be better. No melena to brbpr. Has tried otc prevacid before and has taken some otc gas medications (gas ex) as well.   Drinks coffee about 1 cup of coffee daily. Does smoke - about 0.5 pack per day.   Has a lot of stress in her life over the last several months since her mother passing and redoing her mothers house.   About a coulpe weeks ago she had some constipation and reports she had eaten a fair amount of cheese. Since then she took a dose of mag citrate and that helped but not tacking anything on regular basis since then.  Stools were small balls when she was constipated. For example today she had some short pieces.    Wt Readings from Last 3 Encounters:  06/06/23 139 lb 12.8 oz (63.4 kg)  10/22/22 144 lb 9.6 oz (65.6 kg)  08/01/22 146 lb (66.2 kg)    Current Outpatient Medications  Medication Sig Dispense Refill   acetaminophen (TYLENOL) 500 MG tablet Take 1,000 mg by mouth every 6 (six) hours as needed for moderate pain or headache.     albuterol  (VENTOLIN HFA) 108 (90 Base) MCG/ACT inhaler Inhale 2 puffs into the lungs every 6 (six) hours as needed for wheezing or shortness of breath.     Ascorbic Acid (VITAMIN C) 1000 MG tablet Take 1,000 mg by mouth daily.     aspirin EC 81 MG tablet Take 81 mg by mouth daily.     atenolol (TENORMIN) 50 MG tablet Take 1 tablet by mouth daily.     b complex vitamins capsule Take 1 capsule by mouth once a week.     bismuth subsalicylate (PEPTO BISMOL) 262 MG/15ML suspension Take 30 mLs by mouth every 6 (six) hours as needed for diarrhea or loose stools.     cetirizine (ZYRTEC) 10 MG tablet Take 10 mg by mouth daily as needed for allergies.      cholecalciferol (VITAMIN D3) 25 MCG (1000 UNIT) tablet Take 1,000 Units by mouth daily.     diclofenac sodium (VOLTAREN) 1 % GEL Apply 2 g topically daily as needed (for knee pain).   3   montelukast (SINGULAIR) 10 MG tablet Take 10 mg by mouth daily.     Multiple Vitamin (MULTIVITAMIN WITH MINERALS) TABS tablet Take 1 tablet by mouth daily.     potassium chloride (MICRO-K) 10 MEQ CR capsule Take 10 mEq by mouth daily.     RESTASIS 0.05 % ophthalmic emulsion 1 drop 2 (two) times daily.     rosuvastatin (CRESTOR) 20 MG tablet TAKE 1 TABLET(20 MG) BY MOUTH DAILY 90 tablet 3   triamcinolone cream (KENALOG) 0.1 % Apply 1 Application topically 2 (two) times daily. 30 g 0   triamterene-hydrochlorothiazide (DYAZIDE) 37.5-25 MG capsule Take 1 capsule by mouth daily.     No current facility-administered medications for this visit.    Past Medical History:  Diagnosis Date   Asthma    DDD (degenerative disc disease), lumbar    Hyperlipidemia    Hypertension    Osteoarthritis    Sleep apnea     Past Surgical History:  Procedure Laterality Date   BALLOON DILATION N/A 05/10/2020   Procedure: BALLOON DILATION;  Surgeon: Lanelle Bal, DO;  [facility]: [facility];  Service: Endoscopy;  Laterality: N/A;   BIOPSY  05/10/2020   Procedure: BIOPSY;  Surgeon:  Lanelle Bal, DO;  Location: [facility];  Service: Endoscopy;;   BREAST BIOPSY Right    BUNIONECTOMY Bilateral 2015   CARPAL TUNNEL RELEASE Right 03/30/2017   COLONOSCOPY     COLONOSCOPY N/A 04/25/2017   Procedure: COLONOSCOPY;  Surgeon: West Bali, MD;  Location: [facility];  Service: Endoscopy;  Laterality: N/A;  1200   DG THUMB LEFT HAND     ESOPHAGOGASTRODUODENOSCOPY N/A 04/25/2017   Procedure: ESOPHAGOGASTRODUODENOSCOPY (EGD);  Surgeon: West Bali, MD;  Location: [facility];  Service: Endoscopy;  Laterality: N/A;   ESOPHAGOGASTRODUODENOSCOPY (EGD) WITH PROPOFOL N/A 05/10/2020   Procedure: ESOPHAGOGASTRODUODENOSCOPY (EGD) WITH PROPOFOL;  Surgeon: Lanelle Bal, DO;  Location: [facility];  Service:  Endoscopy;  Laterality: N/A;  2:00pm   SAVORY DILATION N/A 04/25/2017   Procedure: SAVORY DILATION;  Surgeon: West Bali, MD;  Location: [facility];  Service: Endoscopy;  Laterality: N/A;   tonsils removal      Family History  Problem Relation Age of Onset   Hypertension Mother    Breast cancer Maternal Grandmother    Colon cancer Brother 61       Half-brother related by her mother    Allergies as of 06/06/2023 - Review Complete 06/06/2023  Allergen Reaction Noted   Sulfa antibiotics Rash 01/11/2017    Social History   Socioeconomic History   Marital status: Divorced    Spouse name: Not on file   Number of children: Not on file   Years of education: Not on file   Highest education level: Not on file  Occupational History   Not on file  Tobacco Use   Smoking status: Every Day    Current packs/day: 0.25    Average packs/day: 0.3 packs/day for 40.0 years (10.0 ttl pk-yrs)    Types: Cigarettes   Smokeless tobacco: Never  Vaping Use   Vaping status: Never Used  Substance and Sexual Activity   Alcohol use: Yes    Comment: occasional   Drug use: No   Sexual activity: Not on file  Other Topics Concern   Not on file  Social History  Narrative   Not on file   Social Determinants of Health   Financial Resource Strain: Not on file  Food Insecurity: Not on file  Transportation Needs: Not on file  Physical Activity: Not on file  Stress: Not on file  Social Connections: Not on file     Review of Systems   Gen: Denies fever, chills, anorexia. Denies fatigue, weakness, weight loss.  CV: Denies chest pain, palpitations, syncope, peripheral edema, and claudication. Resp: Denies dyspnea at rest, cough, wheezing, coughing up blood, and pleurisy. GI: See HPI Derm: Denies rash, itching, dry skin Psych: Denies depression, anxiety, memory loss, confusion. No homicidal or suicidal ideation.  Heme: Denies bruising, bleeding, and enlarged lymph nodes.  Physical Exam   BP 127/77 (BP Location: Right Arm, Patient Position: Sitting, Cuff Size: Normal)   Pulse 86   Temp 98.4 F (36.9 C) (Oral)   Ht 5\' 5"  (1.651 m)   Wt 139 lb 12.8 oz (63.4 kg)   BMI 23.26 kg/m   General:   Alert and oriented. No distress noted. Pleasant and cooperative.  Head:  Normocephalic and atraumatic. Eyes:  Conjuctiva clear without scleral icterus. Mouth:  Oral mucosa pink and moist. Good dentition. No lesions. Abdomen:  +BS, soft, non-tender and non-distended. No rebound or guarding. No HSM or masses noted. Rectal: deferred Msk:  Symmetrical without gross deformities. Normal posture. Extremities:  Without edema. Neurologic:  Alert and  oriented x4 Psych:  Alert and cooperative. Normal mood and affect.  Assessment  Jasmine Rhodes is a 77 y.o. female with a history of GERD, asthma, HLD, HTN, sleep apnea presenting today with complaint of acid reflux, burping, and flatulence.  Constipation, bloating: History of intermittent constipation, cheese as a dietary trigger.  At her last visit she reported it was fairly well-controlled however recently has been having some more frequent issues with constipation.  We discussed starting fiber  supplementation today.  If no improvement with this we may need to consider stool softener and/or gentle laxative like MiraLAX.  She has tried magnesium citrate in the past for relief which  has been effective.  Suspect some of her bloating and gassiness is related to constipation however could also be secondary to uncontrolled acid reflux.  We can plan to assess for bacterial overgrowth with Trio smart breath test if ongoing gassiness/bloating.  GERD: Still not well-controlled and having almost daily symptoms with frequent belching and burning into the esophagus.  Last EGD in October 2021 with gastritis and benign esophageal stenosis.  Currently not having any complaints of dysphagia.  We will resume PPI now that she has switched back her insurance.  Will treat briefly with twice daily dosing for 4 weeks and then reduce back to once daily.   PLAN   Resume PPI - Omeprazole 40 mg BID for 4 weeks, then reduce to once daily. TCS in 2025 Trio Smart breath test if no improvement with PPI GERD diet Daily fiber supplement (Benefiber) Follow up in 4 months   Brooke Bonito, MSN, FNP-BC, AGACNP-BC [facility]

## 2023-07-08 ENCOUNTER — Encounter (HOSPITAL_COMMUNITY): Payer: Self-pay | Admitting: *Deleted

## 2023-07-08 ENCOUNTER — Emergency Department (HOSPITAL_COMMUNITY): Payer: Medicare Other

## 2023-07-08 ENCOUNTER — Other Ambulatory Visit: Payer: Self-pay

## 2023-07-08 ENCOUNTER — Emergency Department (HOSPITAL_COMMUNITY)
Admission: EM | Admit: 2023-07-08 | Discharge: 2023-07-08 | Disposition: A | Discharge status: Home / Self Care | Payer: Medicare Other | Attending: Emergency Medicine | Admitting: Emergency Medicine

## 2023-07-08 DIAGNOSIS — E876 Hypokalemia: Secondary | ICD-10-CM | POA: Insufficient documentation

## 2023-07-08 DIAGNOSIS — I10 Essential (primary) hypertension: Secondary | ICD-10-CM | POA: Diagnosis not present

## 2023-07-08 DIAGNOSIS — M79645 Pain in left finger(s): Secondary | ICD-10-CM | POA: Diagnosis not present

## 2023-07-08 DIAGNOSIS — J45909 Unspecified asthma, uncomplicated: Secondary | ICD-10-CM | POA: Diagnosis not present

## 2023-07-08 DIAGNOSIS — J3489 Other specified disorders of nose and nasal sinuses: Secondary | ICD-10-CM | POA: Diagnosis not present

## 2023-07-08 DIAGNOSIS — Z7951 Long term (current) use of inhaled steroids: Secondary | ICD-10-CM | POA: Diagnosis not present

## 2023-07-08 DIAGNOSIS — J069 Acute upper respiratory infection, unspecified: Secondary | ICD-10-CM | POA: Diagnosis not present

## 2023-07-08 DIAGNOSIS — R109 Unspecified abdominal pain: Secondary | ICD-10-CM | POA: Insufficient documentation

## 2023-07-08 DIAGNOSIS — S62637D Displaced fracture of distal phalanx of left little finger, subsequent encounter for fracture with routine healing: Secondary | ICD-10-CM | POA: Diagnosis not present

## 2023-07-08 DIAGNOSIS — Z1152 Encounter for screening for COVID-19: Secondary | ICD-10-CM | POA: Diagnosis not present

## 2023-07-08 DIAGNOSIS — Z7982 Long term (current) use of aspirin: Secondary | ICD-10-CM | POA: Insufficient documentation

## 2023-07-08 DIAGNOSIS — R0981 Nasal congestion: Secondary | ICD-10-CM | POA: Diagnosis present

## 2023-07-08 DIAGNOSIS — M19042 Primary osteoarthritis, left hand: Secondary | ICD-10-CM | POA: Diagnosis not present

## 2023-07-08 LAB — COMPREHENSIVE METABOLIC PANEL
ALT: 20 U/L (ref 0–44)
AST: 25 U/L (ref 15–41)
Albumin: 3.9 g/dL (ref 3.5–5.0)
Alkaline Phosphatase: 57 U/L (ref 38–126)
Anion gap: 7 (ref 5–15)
BUN: 15 mg/dL (ref 8–23)
CO2: 29 mmol/L (ref 22–32)
Calcium: 9.3 mg/dL (ref 8.9–10.3)
Chloride: 98 mmol/L (ref 98–111)
Creatinine, Ser: 0.61 mg/dL (ref 0.44–1.00)
GFR, Estimated: >60 mL/min (ref >60)
Glucose, Bld: 90 mg/dL (ref 70–99)
Potassium: 3.4 mmol/L — ABNORMAL LOW (ref 3.5–5.1)
Sodium: 134 mmol/L — ABNORMAL LOW (ref 135–145)
Total Bilirubin: 0.7 mg/dL (ref <1.2)
Total Protein: 6.9 g/dL (ref 6.5–8.1)

## 2023-07-08 LAB — URINALYSIS, ROUTINE W REFLEX MICROSCOPIC
Bilirubin Urine: NEGATIVE
Glucose, UA: NEGATIVE mg/dL
Hgb urine dipstick: NEGATIVE
Ketones, ur: NEGATIVE mg/dL
Leukocytes,Ua: NEGATIVE
Nitrite: NEGATIVE
Protein, ur: NEGATIVE mg/dL
Specific Gravity, Urine: 1.005 (ref 1.005–1.030)
pH: 7 (ref 5.0–8.0)

## 2023-07-08 LAB — CBC
HCT: 40.3 % (ref 36.0–46.0)
Hemoglobin: 13.8 g/dL (ref 12.0–15.0)
MCH: 32.3 pg (ref 26.0–34.0)
MCHC: 34.2 g/dL (ref 30.0–36.0)
MCV: 94.4 fL (ref 80.0–100.0)
Platelets: 226 10*3/uL (ref 150–400)
RBC: 4.27 MIL/uL (ref 3.87–5.11)
RDW: 11.9 % (ref 11.5–15.5)
WBC: 7.1 10*3/uL (ref 4.0–10.5)
nRBC: 0 % (ref 0.0–0.2)

## 2023-07-08 LAB — RESP PANEL BY RT-PCR (RSV, FLU A&B, COVID)  RVPGX2
Influenza A by PCR: NEGATIVE
Influenza B by PCR: NEGATIVE
Resp Syncytial Virus by PCR: NEGATIVE
SARS Coronavirus 2 by RT PCR: NEGATIVE

## 2023-07-08 LAB — LIPASE, BLOOD: Lipase: 32 U/L (ref 11–51)

## 2023-07-08 MED ORDER — BENZONATATE 100 MG PO CAPS: 100.0000 mg | ORAL_CAPSULE | Freq: Three times a day (TID) | ORAL | 0 refills | Status: DC | PRN

## 2023-07-08 NOTE — ED Provider Notes (Signed)
[place] EMERGENCY DEPARTMENT AT [facility] Provider Note   CSN: [id number] Arrival date & time: 07/08/23  1306     History  Chief Complaint  Patient presents with   Nasal Congestion   Flank Pain    Jasmine Rhodes is a 77 y.o. female.  HPI   77 year old female presents emergency department with complaints of a few different complaints.  Patient complaining of left fifth finger pain.  States she had mechanical fall in mid October when she braced her fall with her hands.  States that she felt like her pinky went and read way and has been causing her pain ever since then.  Reporting wanting an x-ray to see if anything was broken.  Denies any weakness or sensory deficits in affected hand.  Patient also reporting right-sided low back/flank pain.  States the pain has been present intermittently for the past month or so but does notice it more constantly over the past several days.  Reports some associated malodorous urine the past few days but denies any dysuria, hematuria, urinary frequency/urgency.  Is a pain is somewhat worsened with movement.  Denies any weakness or sensory deficits lower extremities, saddle anesthesia, bowel/bladder dysfunction, history of IV drug use, prolonged corticosteroid use, no malignancy.  Patient also states for the past 2 to 3 days or so, has had nasal congestion, cough.  Denies any fever, chills, chest pain, shortness of breath, nausea, vomiting.  Has been taking Zyrtec as well as montelukast which has been helping with her symptoms some.  Presents emergency department for further assessment/evaluation.  Medical history significant for asthma, hyperlipidemia, hypertension, OA, sleep apnea, GERD  Home Medications Prior to Admission medications   Medication Sig Start Date End Date Taking? Authorizing Provider  benzonatate (TESSALON) 100 MG capsule Take 1 capsule (100 mg total) by mouth 3 (three) times daily as needed for cough. 07/08/23  Yes  Sherian Maroon A, PA  acetaminophen (TYLENOL) 500 MG tablet Take 1,000 mg by mouth every 6 (six) hours as needed for moderate pain or headache.    [provider]  albuterol (VENTOLIN HFA) 108 (90 Base) MCG/ACT inhaler Inhale 2 puffs into the lungs every 6 (six) hours as needed for wheezing or shortness of breath.    [provider]  Ascorbic Acid (VITAMIN C) 1000 MG tablet Take 1,000 mg by mouth daily.    [provider]  aspirin EC 81 MG tablet Take 81 mg by mouth daily.    [provider]  atenolol (TENORMIN) 50 MG tablet Take 1 tablet by mouth daily.    [provider]  b complex vitamins capsule Take 1 capsule by mouth once a week.    [provider]  bismuth subsalicylate (PEPTO BISMOL) 262 MG/15ML suspension Take 30 mLs by mouth every 6 (six) hours as needed for diarrhea or loose stools.    [provider]  cetirizine (ZYRTEC) 10 MG tablet Take 10 mg by mouth daily as needed for allergies.     [provider]  cholecalciferol (VITAMIN D3) 25 MCG (1000 UNIT) tablet Take 1,000 Units by mouth daily.    [provider]  diclofenac sodium (VOLTAREN) 1 % GEL Apply 2 g topically daily as needed (for knee pain).  03/19/18   [provider]  montelukast (SINGULAIR) 10 MG tablet Take 10 mg by mouth daily. 10/30/21   [provider]  Multiple Vitamin (MULTIVITAMIN WITH MINERALS) TABS tablet Take 1 tablet by mouth daily.  [provider]  omeprazole (PRILOSEC) 40 MG capsule Take 1 capsule (40 mg total) by mouth 2 (two) times daily before a meal. Take twice daily for 4 weeks and then reduce to once daily. 06/06/23   Aida Raider, NP  potassium chloride (MICRO-K) 10 MEQ CR capsule Take 10 mEq by mouth daily. 05/11/22   [provider]  RESTASIS 0.05 % ophthalmic emulsion 1 drop 2 (two) times daily. 04/04/23   [provider]  rosuvastatin (CRESTOR) 20 MG tablet TAKE 1 TABLET(20 MG)  BY MOUTH DAILY 04/02/23   Croitoru, Mihai, MD  triamcinolone cream (KENALOG) 0.1 % Apply 1 Application topically 2 (two) times daily. 05/11/22   Leath-Warren, Sadie Haber, NP  triamterene-hydrochlorothiazide (DYAZIDE) 37.5-25 MG capsule Take 1 capsule by mouth daily.    [provider]      Allergies    Sulfa antibiotics    Review of Systems   Review of Systems  All other systems reviewed and are negative.   Physical Exam Updated Vital Signs BP 113/82   Pulse 64   Temp 98.4 F (36.9 C) (Oral)   Resp 20   Ht 5\' 5"  (1.651 m)   Wt 63.5 kg   SpO2 99%   BMI 23.30 kg/m  Physical Exam Vitals and nursing note reviewed.  Constitutional:      General: She is not in acute distress.    Appearance: She is well-developed.  HENT:     Head: Normocephalic and atraumatic.     Right Ear: Tympanic membrane, ear canal and external ear normal.     Left Ear: Tympanic membrane, ear canal and external ear normal.     Nose: Congestion and rhinorrhea present.     Mouth/Throat:     Comments: Mild posterior pharyngeal erythema.  Uvula midline and symmetric with phonation.  No sublingual similar swelling.  Tonsils 1+ bilaterally without obvious exudate. Eyes:     Conjunctiva/sclera: Conjunctivae normal.  Cardiovascular:     Rate and Rhythm: Normal rate and regular rhythm.  Pulmonary:     Effort: Pulmonary effort is normal. No respiratory distress.     Breath sounds: Normal breath sounds. No wheezing, rhonchi or rales.  Abdominal:     Palpations: Abdomen is soft.     Tenderness: There is no abdominal tenderness. There is no guarding.     Comments: No anterior abdominal tenderness.  Right-sided CVA versus paraspinal stenosis of lumbar region.  Musculoskeletal:        General: No swelling.     Cervical back: Neck supple.     Right lower leg: No edema.     Left lower leg: No edema.     Comments: Full range of motion bilateral hips, knees, ankles, digits.  Muscular strength 5-5 lower  extremities.  No sensory deficits on major nerve distributions of lower extremities.  Pedal pulses 2+ bilaterally.  DTR symmetric at patella. Tender to palpation left distal phalanx of fifth digit of hand.  Skin:    General: Skin is warm and dry.     Capillary Refill: Capillary refill takes less than 2 seconds.  Neurological:     Mental Status: She is alert.  Psychiatric:        Mood and Affect: Mood normal.     ED Results / Procedures / Treatments   Labs (all labs ordered are listed, but only abnormal results are displayed) Labs Reviewed  COMPREHENSIVE METABOLIC PANEL - Abnormal; Notable for the following components:      Result  Value   Sodium 134 (*)    Potassium 3.4 (*)    All other components within normal limits  URINALYSIS, ROUTINE W REFLEX MICROSCOPIC - Abnormal; Notable for the following components:   Color, Urine STRAW (*)    All other components within normal limits  RESP PANEL BY RT-PCR (RSV, FLU A&B, COVID)  RVPGX2  CBC  LIPASE, BLOOD    EKG None  Radiology DG Finger Little Left  Result Date: 07/08/2023 CLINICAL DATA:  s/p fall a month ago. EXAM: LEFT FINGER(S) - 2+ VIEW COMPARISON:  None Available. FINDINGS: There is slightly displaced fracture of the proximal portion of the distal phalanx of left little finger. The fracture margins are not sharp, compatible with provided history of fall a month back. No other acute fracture or dislocation. No aggressive osseous lesion. Mild there are mild degenerative changes of imaged joints. No radiopaque foreign bodies. Soft tissues are within normal limits. IMPRESSION: *Subacute fracture of the proximal portion of the distal phalanx of left little finger. Electronically Signed   By: Jules Schick M.D.   On: 07/08/2023 15:44    Procedures Procedures    Medications Ordered in ED Medications - No data to display  ED Course/ Medical Decision Making/ A&P                                 Medical Decision Making Amount  and/or Complexity of Data Reviewed Labs: ordered. Radiology: ordered.   This patient presents to the ED for concern of nasal congestion, flank pain, this involves an extensive number of treatment options, and is a complaint that carries with it a high risk of complications and morbidity.  The differential diagnosis includes viral URI, pneumonia, nephrolithiasis, pyelonephritis, aortic dissection, herpes zoster, other   Co morbidities that complicate the patient evaluation  See HPI   Additional history obtained:  Additional history obtained from EMR External records from outside source obtained and reviewed including hospital records   Lab Tests:  I Ordered, and personally interpreted labs.  The pertinent results include: No leukocytosis.  No evidence of anemia.  Platelets within range.  Electra abnormalities of hyponatremia 134, hypokalemia 3.4   Imaging Studies ordered:  I ordered imaging studies including left lower finger x-ray I independently visualized and interpreted imaging which showed subacute fracture of proximal portion of the distal phalanx. I agree with the radiologist interpretation   Cardiac Monitoring: / EKG:  The patient was maintained on a cardiac monitor.  I personally viewed and interpreted the cardiac monitored which showed an underlying rhythm of: sinus rhythm   Consultations Obtained:  N/a   Problem List / ED Course / Critical interventions / Medication management  Viral URI, right-sided flank pain Reevaluation of the patient showed that the patient stayed the same I have reviewed the patients home medicines and have made adjustments as needed   Social Determinants of Health:  Chronic use.  Denies illicit drug use.   Test / Admission - Considered:  Viral URI, right-sided flank pain Vitals signs within normal range and stable throughout visit. Laboratory studies significant for: See above 77 year old female presents emergency department  with multiple complaints.  Regarding left fifth finger pain, mechanical fall 2 months ago with continued pain.  On exam, patient with tenderness most distal phalanx.  X-ray shows subacute fracture of area.  Placed in finger splint and will recommend follow-up with orthopedics.  Patient neurovasc intact distally.  Regarding  nasal congestion, cough, symptom onset 2 days ago.  No clinical evidence of pneumonia.  Respiratory viral panel negative.  Suspect that symptoms are likely secondary to viral URI.  Recommend symptomatic therapy as described in AVS.  Regarding right-sided flank pain, onset a month or 2 ago.  On exam, CVA versus paraspinal tenderness noted in the lumbar region.  No red flag signs for back pain HPI/PE; low suspicion for cauda equina, spinal epidural abscess, other spinal cord compression/impingement.  Patient without abdominal pain rating to back, pulse deficits, hypertension, neurologic deficits; low suspicion for aortic dissection.  No overlying rash concerning for herpes zoster.  Patient's UA without evidence of infection/RBCs; low suspicion for pyelonephritis/nephrolithiasis.  Suspect that patient's symptoms likely secondary to musculoskeletal etiology.  Will recommend symptomatic therapy as described in AVS.  Close follow-up with primary care recommended for reevaluation.  Treatment plan discussed at length with patient and she acknowledged understanding was agreeable to said plan.  Patient overall well-appearing, afebrile in no acute distress. Worrisome signs and symptoms were discussed with the patient, and the patient acknowledged understanding to return to the ED if noticed. Patient was stable upon discharge.          Final Clinical Impression(s) / ED Diagnoses Final diagnoses:  Upper respiratory tract infection, unspecified type    Rx / DC Orders ED Discharge Orders          Ordered    benzonatate (TESSALON) 100 MG capsule  3 times daily PRN        07/08/23 1600               Peter Garter, [place] 07/08/23 1640    Royanne Foots, DO 07/14/23 2018

## 2023-07-08 NOTE — ED Notes (Signed)
PA at bedside.

## 2023-07-08 NOTE — Discharge Instructions (Addendum)
As discussed, labs are reassuring.  You tested negative for COVID, flu, RSV.  Urine is without signs of infection/blood cells concerning for kidney infection, kidney stone.  X-ray of your pinky finger did not show evidence of fracture.  Recommend wearing splint until follow-up with primary care/orthopedics in the outpatient setting.  Regarding nasal congestion and slight cough, recommend continued use of allergy medicine as needed along with nasal steroid spray such as Nasacort/Flonase.  Will send you in a cough suppressant to use as needed.  Please do not hesitate to return to the emergency department if there are worrisome signs and symptoms we discussed become apparent.

## 2023-07-08 NOTE — ED Notes (Signed)
Pt walked to and from restroom for UA Attached pt to partial monitor IV access established, labs drawn COVID collected  Pt stated she has nasal congestion, cough and RIGHT flank and side pain Pain 8/10 Pt also complains of LEFT hand pinky swelling

## 2023-07-08 NOTE — ED Notes (Signed)
Pt ambulated to restroom for UA.

## 2023-07-08 NOTE — ED Triage Notes (Signed)
Pt with nasal congestion, itchy eyes.  Pt fell and injured left pinky finger a month ago, swelling still.  Pain to right side for "awhile".

## 2023-07-22 DIAGNOSIS — R7301 Impaired fasting glucose: Secondary | ICD-10-CM | POA: Diagnosis not present

## 2023-07-22 DIAGNOSIS — E782 Mixed hyperlipidemia: Secondary | ICD-10-CM | POA: Diagnosis not present

## 2023-07-22 DIAGNOSIS — I10 Essential (primary) hypertension: Secondary | ICD-10-CM | POA: Diagnosis not present

## 2023-07-29 ENCOUNTER — Other Ambulatory Visit (HOSPITAL_COMMUNITY): Payer: Self-pay | Admitting: Internal Medicine

## 2023-07-29 DIAGNOSIS — R600 Localized edema: Secondary | ICD-10-CM | POA: Diagnosis not present

## 2023-07-29 DIAGNOSIS — M25561 Pain in right knee: Secondary | ICD-10-CM | POA: Diagnosis not present

## 2023-07-29 DIAGNOSIS — I10 Essential (primary) hypertension: Secondary | ICD-10-CM | POA: Diagnosis not present

## 2023-07-29 DIAGNOSIS — R7301 Impaired fasting glucose: Secondary | ICD-10-CM | POA: Diagnosis not present

## 2023-07-29 DIAGNOSIS — G4733 Obstructive sleep apnea (adult) (pediatric): Secondary | ICD-10-CM | POA: Diagnosis not present

## 2023-07-29 DIAGNOSIS — E782 Mixed hyperlipidemia: Secondary | ICD-10-CM | POA: Diagnosis not present

## 2023-07-29 DIAGNOSIS — F17210 Nicotine dependence, cigarettes, uncomplicated: Secondary | ICD-10-CM | POA: Diagnosis not present

## 2023-07-29 DIAGNOSIS — K117 Disturbances of salivary secretion: Secondary | ICD-10-CM | POA: Diagnosis not present

## 2023-07-29 DIAGNOSIS — N3281 Overactive bladder: Secondary | ICD-10-CM | POA: Diagnosis not present

## 2023-07-29 DIAGNOSIS — J302 Other seasonal allergic rhinitis: Secondary | ICD-10-CM | POA: Diagnosis not present

## 2023-07-29 DIAGNOSIS — F17200 Nicotine dependence, unspecified, uncomplicated: Secondary | ICD-10-CM

## 2023-07-29 DIAGNOSIS — M545 Low back pain, unspecified: Secondary | ICD-10-CM | POA: Diagnosis not present

## 2023-08-03 ENCOUNTER — Ambulatory Visit (HOSPITAL_COMMUNITY)
Admission: RE | Admit: 2023-08-03 | Discharge: 2023-08-03 | Disposition: A | Discharge status: Home / Self Care | Payer: Medicare Other | Source: Ambulatory Visit | Attending: Internal Medicine | Admitting: Internal Medicine

## 2023-08-03 DIAGNOSIS — I700 Atherosclerosis of aorta: Secondary | ICD-10-CM | POA: Insufficient documentation

## 2023-08-03 DIAGNOSIS — F17210 Nicotine dependence, cigarettes, uncomplicated: Secondary | ICD-10-CM | POA: Diagnosis not present

## 2023-08-03 DIAGNOSIS — J439 Emphysema, unspecified: Secondary | ICD-10-CM | POA: Diagnosis not present

## 2023-08-03 DIAGNOSIS — Z122 Encounter for screening for malignant neoplasm of respiratory organs: Secondary | ICD-10-CM | POA: Diagnosis not present

## 2023-08-03 DIAGNOSIS — F17200 Nicotine dependence, unspecified, uncomplicated: Secondary | ICD-10-CM

## 2023-08-08 DIAGNOSIS — S62637A Displaced fracture of distal phalanx of left little finger, initial encounter for closed fracture: Secondary | ICD-10-CM | POA: Diagnosis not present

## 2023-08-08 DIAGNOSIS — M79645 Pain in left finger(s): Secondary | ICD-10-CM | POA: Diagnosis not present

## 2023-09-27 ENCOUNTER — Encounter: Payer: Self-pay | Admitting: Gastroenterology

## 2023-11-07 DIAGNOSIS — L811 Chloasma: Secondary | ICD-10-CM | POA: Diagnosis not present

## 2023-11-18 DIAGNOSIS — M5442 Lumbago with sciatica, left side: Secondary | ICD-10-CM | POA: Diagnosis not present

## 2023-11-18 DIAGNOSIS — G8929 Other chronic pain: Secondary | ICD-10-CM | POA: Diagnosis not present

## 2023-11-18 DIAGNOSIS — R519 Headache, unspecified: Secondary | ICD-10-CM | POA: Diagnosis not present

## 2023-11-18 DIAGNOSIS — R600 Localized edema: Secondary | ICD-10-CM | POA: Diagnosis not present

## 2023-11-18 DIAGNOSIS — Z79899 Other long term (current) drug therapy: Secondary | ICD-10-CM | POA: Diagnosis not present

## 2023-11-18 DIAGNOSIS — M5441 Lumbago with sciatica, right side: Secondary | ICD-10-CM | POA: Diagnosis not present

## 2023-11-18 DIAGNOSIS — N3281 Overactive bladder: Secondary | ICD-10-CM | POA: Diagnosis not present

## 2023-11-19 ENCOUNTER — Telehealth: Payer: Self-pay

## 2023-11-19 NOTE — Progress Notes (Signed)
   11/19/2023  Patient ID: Jasmine Rhodes, female   DOB: [birth year], 78 y.o.   MRN: [id number]   Medication Access:  Patient was prescribed Myrbetriq, this was too costly for her. Discussed options with PCP and he would prefer Myrbetriq and Gemtesa. Unfortunately neither medication has a patient assistance program.  I talked with Jasmine Rhodes today and researched her plan information. It looks like Jasmine Rhodes is tier 4 ($100 per 30 days), Myrbetric is tier 3 ($47 per 30 days) and generic vesicare would be around $18 per 30 days. She has a deductible of $255 and is aware she'd have to cover that first before her co-pays return to their typical tier pricing. Plan to check on samples first for a trial so she can decide whether covering the deductible with her first fill would be worth it to her. She's willing to do so if she knows the drug will benefit her. If no samples she will consider purchasing at the pharmacy. Would like to avoid solifenacin but can revisit this if she decides Myrbetriq is too expensive.  Flint Hummer, PharmD

## 2023-12-09 ENCOUNTER — Ambulatory Visit
Admission: EM | Admit: 2023-12-09 | Discharge: 2023-12-09 | Disposition: A | Discharge status: Home / Self Care | Attending: Family Medicine | Admitting: Family Medicine

## 2023-12-09 DIAGNOSIS — B9789 Other viral agents as the cause of diseases classified elsewhere: Secondary | ICD-10-CM

## 2023-12-09 DIAGNOSIS — J329 Chronic sinusitis, unspecified: Secondary | ICD-10-CM

## 2023-12-09 LAB — POC COVID19/FLU A&B COMBO
Covid Antigen, POC: NEGATIVE
Influenza A Antigen, POC: NEGATIVE
Influenza B Antigen, POC: NEGATIVE

## 2023-12-09 MED ORDER — BENZONATATE 200 MG PO CAPS: 200.0000 mg | ORAL_CAPSULE | Freq: Three times a day (TID) | ORAL | 0 refills | Status: DC | PRN

## 2023-12-09 MED ORDER — AZELASTINE HCL 0.1 % NA SOLN: 1.0000 | Freq: Two times a day (BID) | NASAL | 0 refills | Status: DC

## 2023-12-09 NOTE — Discharge Instructions (Signed)
 Your COVID and flu testing was negative today.  In addition to the prescribed medications, you may use Flonase nasal spray, Coricidin HBP, saline sinus rinses, humidifiers

## 2023-12-09 NOTE — ED Triage Notes (Signed)
 Pt presents to UC for c/o runny nose, sneezing, eyes itchy, throat and ears scratchy x 2 days. Pt taking allergy pills and corcidin

## 2023-12-10 NOTE — ED Provider Notes (Signed)
 [facility]    CSN: [id number] Arrival date & time: 12/09/23  1659      History   Chief Complaint No chief complaint on file.   HPI Jasmine Rhodes is a 78 y.o. female.   Patient presenting today with 2-day history of runny nose, sneezing, itchy eyes, sore throat, ear pressure, mild cough.  Denies chest pain, shortness of breath, abdominal pain, vomiting, diarrhea.  Trying antihistamines and Coricidin HBP with mild temporary benefit.  No known sick contacts recently.  History of asthma, seasonal allergies on as needed regimen for both.    Past Medical History:  Diagnosis Date   Asthma    DDD (degenerative disc disease), lumbar    Hyperlipidemia    Hypertension    Osteoarthritis    Sleep apnea     Patient Active Problem List   Diagnosis Date Noted   Coronary artery calcification seen on CT scan 08/03/2022   Centrilobular emphysema (HCC) 08/03/2022   Bleeding internal hemorrhoids 07/06/2020   GERD (gastroesophageal reflux disease) 04/20/2020   Abdominal bloating 04/20/2020   Gastritis and gastroduodenitis    History of colonic polyps 03/11/2017   Chronic RLQ pain 03/11/2017   Abdominal pain, epigastric 03/11/2017   Dysphagia 03/11/2017   Essential hypertension 01/11/2017   Overweight 01/11/2017   Hypercholesterolemia 01/11/2017   Allergic rhinitis 01/11/2017   Mild intermittent asthma 01/11/2017   Osteoarthritis of both knees 01/11/2017   Carpal tunnel syndrome, right 01/11/2017   Chest pain 01/11/2017   Stress 01/11/2017   Lumbago with sciatica, right side 01/11/2017    Past Surgical History:  Procedure Laterality Date   BALLOON DILATION N/A 05/10/2020   Procedure: BALLOON DILATION;  Surgeon: Vinetta Greening, DO;  [facility];  Service: Endoscopy;  Laterality: N/A;   BIOPSY  05/10/2020   Procedure: BIOPSY;  Surgeon: Vinetta Greening, DO;  [facility];  Service: Endoscopy;;   BREAST BIOPSY Right    BUNIONECTOMY  Bilateral 2015   CARPAL TUNNEL RELEASE Right 03/30/2017   COLONOSCOPY     COLONOSCOPY N/A 04/25/2017   Procedure: COLONOSCOPY;  Surgeon: Alyce Jubilee, MD;  [facility];  Service: Endoscopy;  Laterality: N/A;  1200   DG THUMB LEFT HAND     ESOPHAGOGASTRODUODENOSCOPY N/A 04/25/2017   Procedure: ESOPHAGOGASTRODUODENOSCOPY (EGD);  Surgeon: Alyce Jubilee, MD;  [facility];  Service: Endoscopy;  Laterality: N/A;   ESOPHAGOGASTRODUODENOSCOPY (EGD) WITH PROPOFOL  N/A 05/10/2020   Procedure: ESOPHAGOGASTRODUODENOSCOPY (EGD) WITH PROPOFOL ;  Surgeon: Vinetta Greening, DO;  [facility];  Service: Endoscopy;  Laterality: N/A;  2:00pm   SAVORY DILATION N/A 04/25/2017   Procedure: SAVORY DILATION;  Surgeon: Alyce Jubilee, MD;  [facility];  Service: Endoscopy;  Laterality: N/A;   tonsils removal      OB History   No obstetric history on file.      Home Medications    Prior to Admission medications   Medication Sig Start Date End Date Taking? Authorizing Provider  azelastine (ASTELIN) 0.1 % nasal spray Place 1 spray into both nostrils 2 (two) times daily. Use in each nostril as directed 12/09/23  Yes Corbin Dess, PA-C  benzonatate  (TESSALON ) 200 MG capsule Take 1 capsule (200 mg total) by mouth 3 (three) times daily as needed for cough. 12/09/23  Yes Corbin Dess, PA-C  acetaminophen (TYLENOL) 500 MG tablet Take 1,000 mg by mouth every 6 (six) hours as needed for moderate pain  or headache.    [provider]  albuterol  (VENTOLIN  HFA) 108 (90 Base) MCG/ACT inhaler Inhale 2 puffs into the lungs every 6 (six) hours as needed for wheezing or shortness of breath.    [provider]  Ascorbic Acid (VITAMIN C) 1000 MG tablet Take 1,000 mg by mouth daily.    [provider]  aspirin EC 81 MG tablet Take 81 mg by mouth daily.    [provider]  atenolol (TENORMIN) 50 MG tablet Take 1 tablet by mouth  daily.    [provider]  b complex vitamins capsule Take 1 capsule by mouth once a week.    [provider]  benzonatate  (TESSALON ) 100 MG capsule Take 1 capsule (100 mg total) by mouth 3 (three) times daily as needed for cough. 07/08/23   [place] Butter, PA  bismuth subsalicylate (PEPTO BISMOL) 262 MG/15ML suspension Take 30 mLs by mouth every 6 (six) hours as needed for diarrhea or loose stools.    [provider]  cetirizine (ZYRTEC) 10 MG tablet Take 10 mg by mouth daily as needed for allergies.     [provider]  cholecalciferol (VITAMIN D3) 25 MCG (1000 UNIT) tablet Take 1,000 Units by mouth daily.    [provider]  diclofenac  sodium (VOLTAREN ) 1 % GEL Apply 2 g topically daily as needed (for knee pain).  03/19/18   [provider]  montelukast (SINGULAIR) 10 MG tablet Take 10 mg by mouth daily. 10/30/21   [provider]  Multiple Vitamin (MULTIVITAMIN WITH MINERALS) TABS tablet Take 1 tablet by mouth daily.    [provider]  omeprazole  (PRILOSEC) 40 MG capsule Take 1 capsule (40 mg total) by mouth 2 (two) times daily before a meal. Take twice daily for 4 weeks and then reduce to once daily. 06/06/23   Eustacio Highman, NP  potassium chloride (MICRO-K) 10 MEQ CR capsule Take 10 mEq by mouth daily. 05/11/22   [provider]  RESTASIS 0.05 % ophthalmic emulsion 1 drop 2 (two) times daily. 04/04/23   [provider]  rosuvastatin  (CRESTOR ) 20 MG tablet TAKE 1 TABLET(20 MG) BY MOUTH DAILY 04/02/23   Croitoru, Mihai, MD  triamcinolone  cream (KENALOG ) 0.1 % Apply 1 Application topically 2 (two) times daily. 05/11/22   Leath-Warren, Belen Bowers, NP  triamterene-hydrochlorothiazide (DYAZIDE) 37.5-25 MG capsule Take 1 capsule by mouth daily.    [provider]    Family History Family History  Problem Relation Age of Onset   Hypertension Mother    Breast cancer Maternal Grandmother    Colon  cancer Brother 37       Half-brother related by her mother    Social History Social History   Tobacco Use   Smoking status: Every Day    Current packs/day: 0.25    Average packs/day: 0.3 packs/day for 40.0 years (10.0 ttl pk-yrs)    Types: Cigarettes   Smokeless tobacco: Never  Vaping Use   Vaping status: Never Used  Substance Use Topics   Alcohol use: Yes    Comment: occasional   Drug use: No     Allergies   Sulfa antibiotics   Review of Systems Review of Systems Per HPI  Physical Exam Triage Vital Signs ED Triage Vitals  Encounter Vitals Group     BP 12/09/23 1704 136/85     Systolic BP Percentile --      Diastolic BP Percentile --      Pulse Rate 12/09/23  1704 75     Resp 12/09/23 1704 16     Temp 12/09/23 1704 98.1 F (36.7 C)     Temp Source 12/09/23 1704 Oral     SpO2 12/09/23 1704 99 %     Weight --      Height --      Head Circumference --      Peak Flow --      Pain Score 12/09/23 1702 0     Pain Loc --      Pain Education --      Exclude from Growth Chart --    No data found.  Updated Vital Signs BP 136/85 (BP Location: Right Arm)   Pulse 75   Temp 98.1 F (36.7 C) (Oral)   Resp 16   SpO2 99%   Visual Acuity Right Eye Distance:   Left Eye Distance:   Bilateral Distance:    Right Eye Near:   Left Eye Near:    Bilateral Near:     Physical Exam Vitals and nursing note reviewed.  Constitutional:      Appearance: Normal appearance.  HENT:     Head: Atraumatic.     Right Ear: Tympanic membrane and external ear normal.     Left Ear: Tympanic membrane and external ear normal.     Nose: Rhinorrhea present.     Mouth/Throat:     Mouth: Mucous membranes are moist.     Pharynx: Posterior oropharyngeal erythema present.  Eyes:     Extraocular Movements: Extraocular movements intact.     Conjunctiva/sclera: Conjunctivae normal.  Cardiovascular:     Rate and Rhythm: Normal rate and regular rhythm.     Heart sounds: Normal heart  sounds.  Pulmonary:     Effort: Pulmonary effort is normal.     Breath sounds: Normal breath sounds. No wheezing or rales.  Musculoskeletal:        General: Normal range of motion.     Cervical back: Normal range of motion and neck supple.  Skin:    General: Skin is warm and dry.  Neurological:     Mental Status: She is alert and oriented to person, place, and time.  Psychiatric:        Mood and Affect: Mood normal.        Thought Content: Thought content normal.      UC Treatments / Results  Labs (all labs ordered are listed, but only abnormal results are displayed) Labs Reviewed  POC COVID19/FLU A&B COMBO    EKG   Radiology No results found.  Procedures Procedures (including critical care time)  Medications Ordered in UC Medications - No data to display  Initial Impression / Assessment and Plan / UC Course  I have reviewed the triage vital signs and the nursing notes.  Pertinent labs & imaging results that were available during my care of the patient were reviewed by me and considered in my medical decision making (see chart for details).     Rapid flu and COVID-negative today, suspect viral respiratory infection.  Treat with Astelin, Tessalon , supportive over-the-counter medications and home care.  Return for worsening symptoms.  Final Clinical Impressions(s) / UC Diagnoses   Final diagnoses:  Viral sinusitis     Discharge Instructions      Your COVID and flu testing was negative today.  In addition to the prescribed medications, you may use Flonase nasal spray, Coricidin HBP, saline sinus rinses, humidifiers  ED Prescriptions     Medication  Sig Dispense Auth. Provider   azelastine (ASTELIN) 0.1 % nasal spray Place 1 spray into both nostrils 2 (two) times daily. Use in each nostril as directed 30 mL Corbin Dess, PA-C   benzonatate  (TESSALON ) 200 MG capsule Take 1 capsule (200 mg total) by mouth 3 (three) times daily as needed for cough. 21  capsule Corbin Dess, [place]      PDMP not reviewed this encounter.   Corbin Dess, [place] 12/10/23 [phone]

## 2023-12-16 DIAGNOSIS — I700 Atherosclerosis of aorta: Secondary | ICD-10-CM | POA: Diagnosis not present

## 2023-12-16 DIAGNOSIS — J441 Chronic obstructive pulmonary disease with (acute) exacerbation: Secondary | ICD-10-CM | POA: Diagnosis not present

## 2023-12-16 DIAGNOSIS — J309 Allergic rhinitis, unspecified: Secondary | ICD-10-CM | POA: Diagnosis not present

## 2023-12-16 DIAGNOSIS — F17210 Nicotine dependence, cigarettes, uncomplicated: Secondary | ICD-10-CM | POA: Diagnosis not present

## 2023-12-16 DIAGNOSIS — I10 Essential (primary) hypertension: Secondary | ICD-10-CM | POA: Diagnosis not present

## 2023-12-16 DIAGNOSIS — Z79899 Other long term (current) drug therapy: Secondary | ICD-10-CM | POA: Diagnosis not present

## 2023-12-16 DIAGNOSIS — Z713 Dietary counseling and surveillance: Secondary | ICD-10-CM | POA: Diagnosis not present

## 2023-12-16 DIAGNOSIS — R600 Localized edema: Secondary | ICD-10-CM | POA: Diagnosis not present

## 2023-12-16 DIAGNOSIS — J449 Chronic obstructive pulmonary disease, unspecified: Secondary | ICD-10-CM | POA: Diagnosis not present

## 2024-01-21 DIAGNOSIS — R7301 Impaired fasting glucose: Secondary | ICD-10-CM | POA: Diagnosis not present

## 2024-01-21 DIAGNOSIS — E782 Mixed hyperlipidemia: Secondary | ICD-10-CM | POA: Diagnosis not present

## 2024-01-27 ENCOUNTER — Other Ambulatory Visit (HOSPITAL_COMMUNITY): Payer: Self-pay | Admitting: Internal Medicine

## 2024-01-27 DIAGNOSIS — M8580 Other specified disorders of bone density and structure, unspecified site: Secondary | ICD-10-CM

## 2024-01-27 DIAGNOSIS — I10 Essential (primary) hypertension: Secondary | ICD-10-CM | POA: Diagnosis not present

## 2024-01-27 DIAGNOSIS — I700 Atherosclerosis of aorta: Secondary | ICD-10-CM | POA: Diagnosis not present

## 2024-01-27 DIAGNOSIS — I2510 Atherosclerotic heart disease of native coronary artery without angina pectoris: Secondary | ICD-10-CM | POA: Diagnosis not present

## 2024-01-27 DIAGNOSIS — R600 Localized edema: Secondary | ICD-10-CM | POA: Diagnosis not present

## 2024-01-27 DIAGNOSIS — T148XXA Other injury of unspecified body region, initial encounter: Secondary | ICD-10-CM | POA: Diagnosis not present

## 2024-01-27 DIAGNOSIS — M25561 Pain in right knee: Secondary | ICD-10-CM | POA: Diagnosis not present

## 2024-01-27 DIAGNOSIS — E782 Mixed hyperlipidemia: Secondary | ICD-10-CM | POA: Diagnosis not present

## 2024-01-27 DIAGNOSIS — G4733 Obstructive sleep apnea (adult) (pediatric): Secondary | ICD-10-CM | POA: Diagnosis not present

## 2024-01-27 DIAGNOSIS — F17210 Nicotine dependence, cigarettes, uncomplicated: Secondary | ICD-10-CM | POA: Diagnosis not present

## 2024-01-27 DIAGNOSIS — J449 Chronic obstructive pulmonary disease, unspecified: Secondary | ICD-10-CM | POA: Diagnosis not present

## 2024-01-27 DIAGNOSIS — K117 Disturbances of salivary secretion: Secondary | ICD-10-CM | POA: Diagnosis not present

## 2024-01-27 DIAGNOSIS — Z1231 Encounter for screening mammogram for malignant neoplasm of breast: Secondary | ICD-10-CM

## 2024-02-16 NOTE — Progress Notes (Unsigned)
 GI Office Note    Referring Provider: Shona Norleen PEDLAR, MD Primary Care Physician:  Shona Norleen PEDLAR, MD Primary Gastroenterologist: Carlin POUR. Cindie, DO  Date:  02/18/2024  ID:  Ayaan, Ringle 1946/01/31, MRN [id number]  Chief Complaint   Chief Complaint  Patient presents with   Follow-up    Follow up. Bloating, gas, constipation every now and then     History of Present Illness  Cashmere Dingley is a 78 y.o. female with a history of GERD, asthma, HLD, HTN, and sleep apnea presenting today with complaint of constipation, gassiness, and bloating.   EGD October 2021:  -Benign-appearing esophageal stenosis s/p dilation -Gastritis s/p biopsy.  Duodenal polyposis s/p biopsy -Duodenal biopsies with nodular peptic duodenitis -Gastric biopsies with mild chronic gastritis and mild reactive gastropathy negative H. pylori.   Colonoscopy in 2018 - internal hemorrhoids -diverticulosis in the sigmoid and descending colon -Redundant left colon. -Advised high-fiber diet and no repeat colonoscopy due to age.   Office visit 04/23/2022.  Patient presented with intermittent toilet tissue hematochezia as well as some bloating without sharp abdominal pain.  Does have some intermittent cramping.  No unintentional weight loss or early satiety.  History of reflux but not taking omeprazole  and having daily symptoms.  Using a tablespoon of vinegar daily and does not feel that is helping much.  Denied lack of appetite since the passing of her mother earlier last year.  Taking Pepto-Bismol as needed for reflux.  Had a recent spider bite and she was placed on antibiotics.  Family history of colon cancer and diverticulitis in her brother in his 6s.  Simethicone  or Gas-X as needed and to stop using rectal.  She was advised to start Imodium 20 mg once daily prior to breakfast with consideration to restart omeprazole  if famotidine not effective.  Hemorrhoid precautions discussed.  Due for repeat  colonoscopy in 2025.   OV 10/22/2022.  Constipation fairly well-controlled.  Reflux not controlled but was switching from at [facility] and would prefer to wait to start any medication until this occurs.  Advised her to manage with over-the-counter famotidine and Tums as needed and let me know when insurance to switch therefore we can start PPI.  Advised to continue Gas-X 2 tablets when needed or Phazyme.  Advised to avoid gas producing foods, use famotidine as needed and Tums for severe symptoms.  Advised if she needed this on a more frequent basis then we need to consider PPI, she was advised to let us  know when insurance is switched so we could submit for PPI.  Unsure if she would be a TIPS candidate but we could discuss this at next visit.  Last office visit 06/06/23.Having daily reflux symptoms with frequent burning and belching.  Denies any abdominal pain or distention.  Also no nausea or vomiting.  Stated she had tried Prevacid and taking over-the-counter Gas-X as well.  Drinks 1 cup of coffee per day.  Still smokes about half pack per day.  Has reported lots of stress in her life.  Has been dealing with some constipation but she had eaten a fair amount of cheese, had taken a dose of mag citrate which helped but not taking anything on a [place] basis.  During period of constipation she had Bristol 1 stools. Advised PPI BID for 4 weeks then daily. Possible trio smart breath test if no improvement with PPI. GERD diet. Daily fiber.    Today:  Tried sea moss but not fo  her - too gassy with it. Has been having intermittent constipation. Has been using benefiber. She states when she doesn't eat her leafy veggies she gets a build up and then may have bristol 1 stools and then she will take something (generic dulcolax 2 tablets). Sometimes decreases her water  intake as well which may be causing bloating Has been going everyday but everyday is not a full BM. Most of the time bristol 3-4. When  constipated she will having some abdominal cramping.   No melena or brbpr.   Reflux has good days and bad days and depends on what she eats. Using  omeprazole  as needed.   Does report history of colon polyps on prior colonoscopies years ago.  Wt Readings from Last 5 Encounters:  02/18/24 143 lb 9.6 oz (65.1 kg)  07/08/23 140 lb (63.5 kg)  06/06/23 139 lb 12.8 oz (63.4 kg)  10/22/22 144 lb 9.6 oz (65.6 kg)  08/01/22 146 lb (66.2 kg)    Current Outpatient Medications  Medication Sig Dispense Refill   acetaminophen (TYLENOL) 500 MG tablet Take 1,000 mg by mouth every 6 (six) hours as needed for moderate pain or headache.     albuterol  (VENTOLIN  HFA) 108 (90 Base) MCG/ACT inhaler Inhale 2 puffs into the lungs every 6 (six) hours as needed for wheezing or shortness of breath.     Ascorbic Acid (VITAMIN C) 1000 MG tablet Take 1,000 mg by mouth daily.     aspirin EC 81 MG tablet Take 81 mg by mouth daily.     atenolol (TENORMIN) 50 MG tablet Take 1 tablet by mouth daily.     azelastine  (ASTELIN ) 0.1 % nasal spray Place 1 spray into both nostrils 2 (two) times daily. Use in each nostril as directed 30 mL 0   bismuth subsalicylate (PEPTO BISMOL) 262 MG/15ML suspension Take 30 mLs by mouth every 6 (six) hours as needed for diarrhea or loose stools.     cetirizine (ZYRTEC) 10 MG tablet Take 10 mg by mouth daily as needed for allergies.      cholecalciferol (VITAMIN D3) 25 MCG (1000 UNIT) tablet Take 1,000 Units by mouth daily.     clobetasol cream (TEMOVATE) 0.05 % SMARTSIG:sparingly Topical Twice Daily     diclofenac  sodium (VOLTAREN ) 1 % GEL Apply 2 g topically daily as needed (for knee pain).   3   furosemide (LASIX) 20 MG tablet Take 20 mg by mouth daily.     montelukast (SINGULAIR) 10 MG tablet Take 10 mg by mouth daily.     Multiple Vitamin (MULTIVITAMIN WITH MINERALS) TABS tablet Take 1 tablet by mouth daily.     omeprazole  (PRILOSEC) 40 MG capsule Take 1 capsule (40 mg total) by mouth 2  (two) times daily before a meal. Take twice daily for 4 weeks and then reduce to once daily. 90 capsule 3   potassium chloride (MICRO-K) 10 MEQ CR capsule Take 10 mEq by mouth daily.     RESTASIS 0.05 % ophthalmic emulsion 1 drop 2 (two) times daily.     rosuvastatin  (CRESTOR ) 20 MG tablet TAKE 1 TABLET(20 MG) BY MOUTH DAILY 90 tablet 3   triamcinolone  cream (KENALOG ) 0.1 % Apply 1 Application topically 2 (two) times daily. 30 g 0   triamterene-hydrochlorothiazide (DYAZIDE) 37.5-25 MG capsule Take 1 capsule by mouth daily.     b complex vitamins capsule Take 1 capsule by mouth once a week. (Patient not taking: Reported on 02/18/2024)     No current facility-administered medications  for this visit.    Past Medical History:  Diagnosis Date   Asthma    DDD (degenerative disc disease), lumbar    Hyperlipidemia    Hypertension    Osteoarthritis    Sleep apnea     Past Surgical History:  Procedure Laterality Date   BALLOON DILATION N/A 05/10/2020   Procedure: BALLOON DILATION;  Surgeon: Cindie Carlin POUR, DO;  [facility];  Service: Endoscopy;  Laterality: N/A;   BIOPSY  05/10/2020   Procedure: BIOPSY;  Surgeon: Cindie Carlin POUR, DO;  [facility];  Service: Endoscopy;;   BREAST BIOPSY Right    BUNIONECTOMY Bilateral 2015   CARPAL TUNNEL RELEASE Right 03/30/2017   COLONOSCOPY     COLONOSCOPY N/A 04/25/2017   Procedure: COLONOSCOPY;  Surgeon: Harvey Margo CROME, MD;  [facility];  Service: Endoscopy;  Laterality: N/A;  1200   DG THUMB LEFT HAND     ESOPHAGOGASTRODUODENOSCOPY N/A 04/25/2017   Procedure: ESOPHAGOGASTRODUODENOSCOPY (EGD);  Surgeon: Harvey Margo CROME, MD;  [facility];  Service: Endoscopy;  Laterality: N/A;   ESOPHAGOGASTRODUODENOSCOPY (EGD) WITH PROPOFOL  N/A 05/10/2020   Procedure: ESOPHAGOGASTRODUODENOSCOPY (EGD) WITH PROPOFOL ;  Surgeon: Cindie Carlin POUR, DO;  [facility];  Service: Endoscopy;  Laterality: N/A;   2:00pm   SAVORY DILATION N/A 04/25/2017   Procedure: SAVORY DILATION;  Surgeon: Harvey Margo CROME, MD;  [facility];  Service: Endoscopy;  Laterality: N/A;   tonsils removal      Family History  Problem Relation Age of Onset   Hypertension Mother    Breast cancer Maternal Grandmother    Colon cancer Brother 45       Half-brother related by her mother    Allergies as of 02/18/2024 - Review Complete 02/18/2024  Allergen Reaction Noted   Sulfa antibiotics Rash 01/11/2017    Social History   Socioeconomic History   Marital status: Divorced    Spouse name: Not on file   Number of children: Not on file   Years of education: Not on file   Highest education level: Not on file  Occupational History   Not on file  Tobacco Use   Smoking status: Every Day    Current packs/day: 0.25    Average packs/day: 0.3 packs/day for 40.0 years (10.0 ttl pk-yrs)    Types: Cigarettes   Smokeless tobacco: Never  Vaping Use   Vaping status: Never Used  Substance and Sexual Activity   Alcohol use: Yes    Comment: occasional   Drug use: No   Sexual activity: Not on file  Other Topics Concern   Not on file  Social History Narrative   Not on file   Social Drivers of Health   Financial Resource Strain: Not on file  Food Insecurity: Not on file  Transportation Needs: Not on file  Physical Activity: Not on file  Stress: Not on file  Social Connections: Not on file   Review of Systems   Gen: Denies fever, chills, anorexia. Denies fatigue, weakness, weight loss.  CV: Denies chest pain, palpitations, syncope, peripheral edema, and claudication. Resp: Denies dyspnea at rest, cough, wheezing, coughing up blood, and pleurisy. GI: See HPI Derm: Denies rash, itching, dry skin Psych: Denies depression, anxiety, memory loss, confusion. No homicidal or suicidal ideation.  Heme: Denies bruising, bleeding, and enlarged lymph nodes.  Physical Exam   BP 124/71 (BP Location: Right Arm,  Patient Position: Sitting, Cuff Size: Normal)   Pulse 65  Temp 97.9 F (36.6 C) (Temporal)   Ht 5' 5 (1.651 m)   Wt 143 lb 9.6 oz (65.1 kg)   BMI 23.90 kg/m   General:   Alert and oriented. No distress noted. Pleasant and cooperative.  Head:  Normocephalic and atraumatic. Eyes:  Conjuctiva clear without scleral icterus. Mouth:  Oral mucosa pink and moist. Good dentition. No lesions. Abdomen:  +BS, soft, non-tender and non-distended. No rebound or guarding. No HSM or masses noted. Rectal: deferred Msk:  Symmetrical without gross deformities. Normal posture. Extremities:  Without edema. Neurologic:  Alert and  oriented x4 Psych:  Alert and cooperative. Normal mood and affect.  Assessment  Alexica Schlossberg is a 78 y.o. female presenting today with constipation, bloating, gassiness.   Constipation: Given prior gassiness, she attempted consumption of sea moss which she was unable to tolerate.  For constipation she has continued to take her Benefiber and as long as she continues her intake of green leafy vegetables she has good stools.  If she does not stay with this she may have Bristol 1 stools at times and then will take 2 tablets of Dulcolax as needed.  Lately when she has been experiencing constipation she likely also is not getting enough water  intake which is contributing.  Denies any melena or BRBPR.  Advise MiraLAX 1 capful as needed and that she can use this in place of Dulcolax given it is gentler.  GERD: Continues to use omeprazole  as needed.  States her good and bad days usually depend on what she eats.  If she eats a well-balanced diet she does not have much issue.  Discussed that if she is having more frequent symptoms that she could do daily PPI for 2 weeks and then back to as needed or the other option of using Pepcid as needed.  History of colon polyps, family history colon cancer: Has family history of colon cancer in her half-brother.  Her last colonoscopy was in 2018  with evidence of diverticulosis and redundant left colon.  She reports history of polyps on colonoscopy prior to this.  Given her family history she would like to consider repeating colonoscopy despite previous recommendation of it not being needed given her age.  Given she has overall fairly healthy, I believe it would be safe to pursue surveillance colonoscopy therefore we will plan for September of this year.  No alarm symptoms present at this time.  PLAN   Proceed with colonoscopy with propofol  by Dr. Cindie  in near future: the risks, benefits, and alternatives have been discussed with the patient in detail. The patient states understanding and desires to proceed. ASA 3 (ok room 1/2) - September BMP pre-op Omeprazole  40 mg daily as needed or Pepcid as needed.  GERD diet MiraLAX 17g as needed Continue benefiber daily + high fiber diet Follow up 6 months.     Charmaine Melia, MSN, FNP-BC, AGACNP-BC [facility]

## 2024-02-18 ENCOUNTER — Encounter: Payer: Self-pay | Admitting: Gastroenterology

## 2024-02-18 ENCOUNTER — Ambulatory Visit: Admitting: Gastroenterology

## 2024-02-18 VITALS — BP 124/71 | HR 65 | Temp 97.9°F | Ht 65.0 in | Wt 143.6 lb

## 2024-02-18 DIAGNOSIS — R140 Abdominal distension (gaseous): Secondary | ICD-10-CM

## 2024-02-18 DIAGNOSIS — Z860100 Personal history of colon polyps, unspecified: Secondary | ICD-10-CM

## 2024-02-18 DIAGNOSIS — Z8719 Personal history of other diseases of the digestive system: Secondary | ICD-10-CM

## 2024-02-18 DIAGNOSIS — K5900 Constipation, unspecified: Secondary | ICD-10-CM

## 2024-02-18 DIAGNOSIS — K219 Gastro-esophageal reflux disease without esophagitis: Secondary | ICD-10-CM

## 2024-02-18 DIAGNOSIS — Z800 Family history of malignant neoplasm of digestive organs: Secondary | ICD-10-CM | POA: Diagnosis not present

## 2024-02-18 NOTE — Patient Instructions (Signed)
 We will get you scheduled for colonoscopy in September.  If you change Rehman please let me know, we have the option to do Cologuard and can then pursue colonoscopy if positive.  Please continue omeprazole  40 mg as needed.  When you are having frequent flares I do want you to take for about 2 weeks on a daily basis.  If you do not want to take omeprazole , you have the option of taking Pepcid daily or as needed as well which can also help control reflux.  For your constipation continue your high-fiber diet and Benefiber daily.  If having multiple days of hard balls of stool or difficulty with straining you may take MiraLAX as needed as well.  Sometimes it takes more than 1 dose to be effective.  It would be okay to take for a week at a time and then use as needed.  If you have any questions regarding how to use this please let me know.  Will follow-up in 6 months but please do not hesitate to reach out if you are having any worsening symptoms.  It was a pleasure to see you today. I want to create trusting relationships with patients. If you receive a survey regarding your visit,  I greatly appreciate you taking time to fill this out on paper or through your MyChart. I value your feedback.  Charmaine Melia, MSN, FNP-BC, AGACNP-BC [facility]

## 2024-03-06 ENCOUNTER — Other Ambulatory Visit: Payer: Self-pay | Admitting: Cardiovascular Disease

## 2024-03-06 ENCOUNTER — Telehealth: Payer: Self-pay | Admitting: *Deleted

## 2024-03-06 DIAGNOSIS — Z1211 Encounter for screening for malignant neoplasm of colon: Secondary | ICD-10-CM

## 2024-03-06 DIAGNOSIS — Z800 Family history of malignant neoplasm of digestive organs: Secondary | ICD-10-CM

## 2024-03-06 NOTE — Telephone Encounter (Signed)
 LMOVM to call back to schedule TCS with Dr. Cindie ASA 3 (OK RM 1-2). BMET PRIOR in september

## 2024-03-12 MED ORDER — PEG 3350-KCL-NA BICARB-NACL 420 G PO SOLR: 4000.0000 mL | Freq: Once | ORAL | 0 refills | Status: AC

## 2024-03-12 NOTE — Telephone Encounter (Signed)
 Spoke with pt. She has been scheduled for 9/16. Aware will mail instructions and send rx for prep to pharmacy.

## 2024-03-12 NOTE — Addendum Note (Signed)
 Addended by: JEANELL GRAEME RAMAN on: 03/12/2024 02:14 PM   Modules accepted: Orders

## 2024-04-08 ENCOUNTER — Other Ambulatory Visit (HOSPITAL_COMMUNITY)
Admission: RE | Admit: 2024-04-08 | Discharge: 2024-04-08 | Disposition: A | Discharge status: Home / Self Care | Source: Ambulatory Visit | Attending: Internal Medicine | Admitting: Internal Medicine

## 2024-04-08 DIAGNOSIS — Z1211 Encounter for screening for malignant neoplasm of colon: Secondary | ICD-10-CM | POA: Diagnosis not present

## 2024-04-08 DIAGNOSIS — Z800 Family history of malignant neoplasm of digestive organs: Secondary | ICD-10-CM | POA: Insufficient documentation

## 2024-04-08 LAB — BASIC METABOLIC PANEL WITH GFR
Anion gap: 14 (ref 5–15)
BUN: 13 mg/dL (ref 8–23)
CO2: 25 mmol/L (ref 22–32)
Calcium: 9.2 mg/dL (ref 8.9–10.3)
Chloride: 96 mmol/L — ABNORMAL LOW (ref 98–111)
Creatinine, Ser: 0.62 mg/dL (ref 0.44–1.00)
GFR, Estimated: >60 mL/min (ref >60)
Glucose, Bld: 109 mg/dL — ABNORMAL HIGH (ref 70–99)
Potassium: 3.3 mmol/L — ABNORMAL LOW (ref 3.5–5.1)
Sodium: 135 mmol/L (ref 135–145)

## 2024-04-09 ENCOUNTER — Telehealth: Payer: Self-pay | Admitting: *Deleted

## 2024-04-09 NOTE — Telephone Encounter (Signed)
 Received message from endo patient did not have prep and pharmacy told her they didn't either.  Called [facility] as this was sent in and receipt confirmed by pharmacy. Was advised it was on file and they will get ready for pt.  Called pt, LMOVM making aware

## 2024-04-14 ENCOUNTER — Ambulatory Visit (HOSPITAL_COMMUNITY)
Admission: RE | Admit: 2024-04-14 | Discharge: 2024-04-14 | Disposition: A | Discharge status: Home / Self Care | Attending: Internal Medicine | Admitting: Internal Medicine

## 2024-04-14 ENCOUNTER — Other Ambulatory Visit: Payer: Self-pay

## 2024-04-14 ENCOUNTER — Ambulatory Visit (HOSPITAL_COMMUNITY): Admitting: Anesthesiology

## 2024-04-14 ENCOUNTER — Encounter (HOSPITAL_COMMUNITY): Payer: Self-pay | Admitting: Internal Medicine

## 2024-04-14 ENCOUNTER — Encounter (HOSPITAL_COMMUNITY)
Admission: RE | Disposition: A | Discharge status: Home / Self Care | Payer: Self-pay | Source: Home / Self Care | Attending: Internal Medicine

## 2024-04-14 ENCOUNTER — Ambulatory Visit (HOSPITAL_BASED_OUTPATIENT_CLINIC_OR_DEPARTMENT_OTHER): Admitting: Anesthesiology

## 2024-04-14 DIAGNOSIS — K635 Polyp of colon: Secondary | ICD-10-CM

## 2024-04-14 DIAGNOSIS — Z860101 Personal history of adenomatous and serrated colon polyps: Secondary | ICD-10-CM | POA: Diagnosis not present

## 2024-04-14 DIAGNOSIS — D123 Benign neoplasm of transverse colon: Secondary | ICD-10-CM

## 2024-04-14 DIAGNOSIS — Z1211 Encounter for screening for malignant neoplasm of colon: Secondary | ICD-10-CM | POA: Diagnosis not present

## 2024-04-14 DIAGNOSIS — J449 Chronic obstructive pulmonary disease, unspecified: Secondary | ICD-10-CM | POA: Insufficient documentation

## 2024-04-14 DIAGNOSIS — F17200 Nicotine dependence, unspecified, uncomplicated: Secondary | ICD-10-CM | POA: Diagnosis not present

## 2024-04-14 DIAGNOSIS — I2510 Atherosclerotic heart disease of native coronary artery without angina pectoris: Secondary | ICD-10-CM | POA: Insufficient documentation

## 2024-04-14 DIAGNOSIS — I10 Essential (primary) hypertension: Secondary | ICD-10-CM | POA: Diagnosis not present

## 2024-04-14 DIAGNOSIS — G4730 Sleep apnea, unspecified: Secondary | ICD-10-CM | POA: Diagnosis not present

## 2024-04-14 DIAGNOSIS — K648 Other hemorrhoids: Secondary | ICD-10-CM | POA: Insufficient documentation

## 2024-04-14 DIAGNOSIS — K5730 Diverticulosis of large intestine without perforation or abscess without bleeding: Secondary | ICD-10-CM

## 2024-04-14 DIAGNOSIS — D127 Benign neoplasm of rectosigmoid junction: Secondary | ICD-10-CM | POA: Diagnosis not present

## 2024-04-14 DIAGNOSIS — F17210 Nicotine dependence, cigarettes, uncomplicated: Secondary | ICD-10-CM | POA: Diagnosis not present

## 2024-04-14 HISTORY — PX: COLONOSCOPY: SHX5424

## 2024-04-14 SURGERY — COLONOSCOPY
Anesthesia: General

## 2024-04-14 MED ORDER — PROPOFOL 10 MG/ML IV BOLUS
INTRAVENOUS | Status: DC | PRN
  Administered 2024-04-14: 30 mg via INTRAVENOUS
  Administered 2024-04-14 (×2): 20 mg via INTRAVENOUS
  Administered 2024-04-14: 50 mg via INTRAVENOUS
  Administered 2024-04-14: 100 mg via INTRAVENOUS

## 2024-04-14 MED ORDER — LACTATED RINGERS IV SOLN
INTRAVENOUS | Status: DC
Start: 2024-04-14 — End: 2024-04-14

## 2024-04-14 MED ORDER — LIDOCAINE HCL (CARDIAC) PF 100 MG/5ML IV SOSY
PREFILLED_SYRINGE | INTRAVENOUS | Status: DC | PRN
  Administered 2024-04-14: 50 mg via INTRAVENOUS

## 2024-04-14 NOTE — Transfer of Care (Signed)
 Immediate Anesthesia Transfer of Care Note  Patient: Jasmine Rhodes  Procedure(s) Performed: COLONOSCOPY  Patient Location: Endoscopy Unit  Anesthesia Type:General  Level of Consciousness: awake, alert , oriented, and patient cooperative  Airway & Oxygen Therapy: Patient Spontanous Breathing  Post-op Assessment: Report given to RN and Post -op Vital signs reviewed and stable  Post vital signs: Reviewed and stable  Last Vitals:  Vitals Value Taken Time  BP 115/79 04/14/24 09:26  Temp 36.9 C 04/14/24 09:26  Pulse 75 04/14/24 09:26  Resp 14 04/14/24 09:26  SpO2 100 % 04/14/24 09:26    Last Pain:  Vitals:   04/14/24 0926  TempSrc: Oral  PainSc: 0-No pain      Patients Stated Pain Goal: 10 (04/14/24 9177)  Complications: No notable events documented.

## 2024-04-14 NOTE — Anesthesia Preprocedure Evaluation (Signed)
 Anesthesia Evaluation  Patient identified by MRN, date of birth, ID band Patient awake    Reviewed: Allergy & Precautions, H&P , NPO status , Patient's Chart, lab work & pertinent test results, reviewed documented beta blocker date and time   Airway Mallampati: II  TM Distance: >3 FB Neck ROM: full    Dental no notable dental hx.    Pulmonary asthma , sleep apnea , COPD, Current Smoker   Pulmonary exam normal breath sounds clear to auscultation       Cardiovascular Exercise Tolerance: Good hypertension, + CAD   Rhythm:regular Rate:Normal     Neuro/Psych  Neuromuscular disease  negative psych ROS   GI/Hepatic Neg liver ROS,GERD  ,,  Endo/Other  negative endocrine ROS    Renal/GU negative Renal ROS  negative genitourinary   Musculoskeletal   Abdominal   Peds  Hematology negative hematology ROS (+)   Anesthesia Other Findings   Reproductive/Obstetrics negative OB ROS                              Anesthesia Physical Anesthesia Plan  ASA: 2  Anesthesia Plan: General   Post-op Pain Management:    Induction:   PONV Risk Score and Plan: Propofol  infusion  Airway Management Planned:   Additional Equipment:   Intra-op Plan:   Post-operative Plan:   Informed Consent: I have reviewed the patients History and Physical, chart, labs and discussed the procedure including the risks, benefits and alternatives for the proposed anesthesia with the patient or authorized representative who has indicated his/her understanding and acceptance.     Dental Advisory Given  Plan Discussed with: CRNA  Anesthesia Plan Comments:         Anesthesia Quick Evaluation

## 2024-04-14 NOTE — Discharge Instructions (Addendum)
  Colonoscopy Discharge Instructions  Read the instructions outlined below and refer to this sheet in the next few weeks. These discharge instructions provide you with general information on caring for yourself after you leave the hospital. Your doctor may also give you specific instructions. While your treatment has been planned according to the most current medical practices available, unavoidable complications occasionally occur.   ACTIVITY You may resume your regular activity, but move at a slower pace for the next 24 hours.  Take frequent rest periods for the next 24 hours.  Walking will help get rid of the air and reduce the bloated feeling in your belly (abdomen).  No driving for 24 hours (because of the medicine (anesthesia) used during the test).   Do not sign any important legal documents or operate any machinery for 24 hours (because of the anesthesia used during the test).  NUTRITION Drink plenty of fluids.  You may resume your normal diet as instructed by your doctor.  Begin with a light meal and progress to your normal diet. Heavy or fried foods are harder to digest and may make you feel sick to your stomach (nauseated).  Avoid alcoholic beverages for 24 hours or as instructed.  MEDICATIONS You may resume your normal medications unless your doctor tells you otherwise.  WHAT YOU CAN EXPECT TODAY Some feelings of bloating in the abdomen.  Passage of more gas than usual.  Spotting of blood in your stool or on the toilet paper.  IF YOU HAD POLYPS REMOVED DURING THE COLONOSCOPY: No aspirin products for 7 days or as instructed.  No alcohol for 7 days or as instructed.  Eat a soft diet for the next 24 hours.  FINDING OUT THE RESULTS OF YOUR TEST Not all test results are available during your visit. If your test results are not back during the visit, make an appointment with your caregiver to find out the results. Do not assume everything is normal if you have not heard from your  caregiver or the medical facility. It is important for you to follow up on all of your test results.  SEEK IMMEDIATE MEDICAL ATTENTION IF: You have more than a spotting of blood in your stool.  Your belly is swollen (abdominal distention).  You are nauseated or vomiting.  You have a temperature over 101.  You have abdominal pain or discomfort that is severe or gets worse throughout the day.   Your colonoscopy revealed 5 small polyp(s) which I removed successfully. Most of these are likely benign hyperplastic polyps. Await pathology results, my office will contact you.Given your age, I do not think further colonoscopies for polyp surveillance purposes are warranted.    You also have diverticulosis and internal hemorrhoids. I would recommend increasing fiber in your diet or adding OTC Benefiber/Metamucil. Be sure to drink at least 4 to 6 glasses of water  daily. Follow-up with GI in 6 months   I hope you have a great rest of your week!  Carlin POUR. Cindie, D.O. Gastroenterology and Hepatology [facility]

## 2024-04-14 NOTE — H&P (Signed)
 Primary Care Physician:  Shona Norleen PEDLAR, MD Primary Gastroenterologist:  Dr. Cindie  Pre-Procedure History & Physical: HPI:  Jasmine Rhodes is a 78 y.o. female is here for a colonoscopy to be performed for surveillance purposes, personal history of adenomatous colon polyps   Past Medical History:  Diagnosis Date   Asthma    DDD (degenerative disc disease), lumbar    Hyperlipidemia    Hypertension    Osteoarthritis    Sleep apnea     Past Surgical History:  Procedure Laterality Date   BALLOON DILATION N/A 05/10/2020   Procedure: BALLOON DILATION;  Surgeon: Cindie Carlin POUR, DO;  [facility];  Service: Endoscopy;  Laterality: N/A;   BIOPSY  05/10/2020   Procedure: BIOPSY;  Surgeon: Cindie Carlin POUR, DO;  [facility];  Service: Endoscopy;;   BREAST BIOPSY Right    BUNIONECTOMY Bilateral 2015   CARPAL TUNNEL RELEASE Right 03/30/2017   COLONOSCOPY     COLONOSCOPY N/A 04/25/2017   Procedure: COLONOSCOPY;  Surgeon: Harvey Margo CROME, MD;  Location: [facility];  Service: Endoscopy;  Laterality: N/A;  1200   DG THUMB LEFT HAND     ESOPHAGOGASTRODUODENOSCOPY N/A 04/25/2017   Procedure: ESOPHAGOGASTRODUODENOSCOPY (EGD);  Surgeon: Harvey Margo CROME, MD;  [facility];  Service: Endoscopy;  Laterality: N/A;   ESOPHAGOGASTRODUODENOSCOPY (EGD) WITH PROPOFOL  N/A 05/10/2020   Procedure: ESOPHAGOGASTRODUODENOSCOPY (EGD) WITH PROPOFOL ;  Surgeon: Cindie Carlin POUR, DO;  [facility];  Service: Endoscopy;  Laterality: N/A;  2:00pm   SAVORY DILATION N/A 04/25/2017   Procedure: SAVORY DILATION;  Surgeon: Harvey Margo CROME, MD;  [facility];  Service: Endoscopy;  Laterality: N/A;   tonsils removal      Prior to Admission medications   Medication Sig Start Date End Date Taking? Authorizing Provider  Ascorbic Acid (VITAMIN C) 1000 MG tablet Take 1,000 mg by mouth daily.   Yes [provider]  aspirin EC 81 MG tablet Take 81 mg by  mouth daily.   Yes [provider]  atenolol (TENORMIN) 50 MG tablet Take 1 tablet by mouth daily.   Yes [provider]  azelastine  (ASTELIN ) 0.1 % nasal spray Place 1 spray into both nostrils 2 (two) times daily. Use in each nostril as directed 12/09/23  Yes Stuart Vernell Norris, PA-C  cetirizine (ZYRTEC) 10 MG tablet Take 10 mg by mouth daily as needed for allergies.    Yes [provider]  cholecalciferol (VITAMIN D3) 25 MCG (1000 UNIT) tablet Take 1,000 Units by mouth daily.   Yes [provider]  clobetasol cream (TEMOVATE) 0.05 % SMARTSIG:sparingly Topical Twice Daily 01/27/24  Yes [provider]  diclofenac  sodium (VOLTAREN ) 1 % GEL Apply 2 g topically daily as needed (for knee pain).  03/19/18  Yes [provider]  furosemide (LASIX) 20 MG tablet Take 20 mg by mouth daily. 11/18/23  Yes [provider]  montelukast (SINGULAIR) 10 MG tablet Take 10 mg by mouth daily. 10/30/21  Yes [provider]  Multiple Vitamin (MULTIVITAMIN WITH MINERALS) TABS tablet Take 1 tablet by mouth daily.   Yes [provider]  omeprazole  (PRILOSEC) 40 MG capsule Take 1 capsule (40 mg total) by mouth 2 (two) times daily before a meal. Take twice daily for 4 weeks and then reduce to once daily. 06/06/23  Yes Mahon, Courtney L, NP  potassium chloride (MICRO-K) 10 MEQ CR capsule Take 10 mEq by mouth daily. 05/11/22  Yes [provider]  RESTASIS 0.05 % ophthalmic emulsion 1 drop 2 (two) times daily. 04/04/23  Yes [provider]  rosuvastatin  (CRESTOR ) 20 MG tablet TAKE 1 TABLET(20 MG) BY MOUTH DAILY 03/06/24  Yes Croitoru, Mihai, MD  triamcinolone  cream (KENALOG ) 0.1 % Apply 1 Application topically 2 (two) times daily. 05/11/22  Yes Leath-Warren, Etta PARAS, NP  triamterene-hydrochlorothiazide (DYAZIDE) 37.5-25 MG capsule Take 1 capsule by mouth daily.   Yes [provider]  acetaminophen (TYLENOL) 500 MG tablet Take  1,000 mg by mouth every 6 (six) hours as needed for moderate pain or headache.    [provider]  albuterol  (VENTOLIN  HFA) 108 (90 Base) MCG/ACT inhaler Inhale 2 puffs into the lungs every 6 (six) hours as needed for wheezing or shortness of breath.    [provider]  b complex vitamins capsule Take 1 capsule by mouth once a week. Patient not taking: Reported on 02/18/2024    [provider]  bismuth subsalicylate (PEPTO BISMOL) 262 MG/15ML suspension Take 30 mLs by mouth every 6 (six) hours as needed for diarrhea or loose stools.    [provider]    Allergies as of 03/12/2024 - Review Complete 02/18/2024  Allergen Reaction Noted   Sulfa antibiotics Rash 01/11/2017    Family History  Problem Relation Age of Onset   Hypertension Mother    Breast cancer Maternal Grandmother    Colon cancer Brother 62       Half-brother related by her mother    Social History   Socioeconomic History   Marital status: Divorced    Spouse name: Not on file   Number of children: Not on file   Years of education: Not on file   Highest education level: Not on file  Occupational History   Not on file  Tobacco Use   Smoking status: Every Day    Current packs/day: 0.25    Average packs/day: 0.3 packs/day for 40.0 years (10.0 ttl pk-yrs)    Types: Cigarettes   Smokeless tobacco: Never  Vaping Use   Vaping status: Never Used  Substance and Sexual Activity   Alcohol use: Yes    Comment: occasional   Drug use: No   Sexual activity: Not on file  Other Topics Concern   Not on file  Social History Narrative   Not on file   Social Drivers of Health   Financial Resource Strain: Not on file  Food Insecurity: Not on file  Transportation Needs: Not on file  Physical Activity: Not on file  Stress: Not on file  Social Connections: Not on file  Intimate Partner Violence: Not on file    Review of Systems: See HPI, otherwise negative ROS  Physical Exam: Vital  signs in last 24 hours: Temp:  [97.7 F (36.5 C)] 97.7 F (36.5 C) (09/16 0822) Pulse Rate:  [79] 79 (09/16 0822) Resp:  [16] 16 (09/16 0822) BP: (135)/(78) 135/78 (09/16 0822) SpO2:  [100 %] 100 % (09/16 0822) Weight:  [65.3 kg] 65.3 kg (09/16 0822)   General:   Alert,  Well-developed, well-nourished, pleasant and cooperative in NAD Head:  Normocephalic and atraumatic. Eyes:  Sclera clear, no icterus.   Conjunctiva pink. Ears:  Normal auditory acuity. Nose:  No deformity, discharge,  or lesions. Msk:  Symmetrical without gross deformities. Normal posture. Extremities:  Without clubbing or edema. Neurologic:  Alert and  oriented x4;  grossly normal neurologically. Skin:  Intact without significant lesions or rashes. Psych:  Alert and cooperative. Normal  mood and affect.  Impression/Plan: Jasmine Rhodes is here for a colonoscopy to be performed for surveillance purposes, personal history of adenomatous colon polyps   The risks of the procedure including infection, bleed, or perforation as well as benefits, limitations, alternatives and imponderables have been reviewed with the patient. Questions have been answered. All parties agreeable.

## 2024-04-14 NOTE — Op Note (Signed)
 [facility] Patient Name: Jasmine Rhodes Procedure Date: 04/14/2024 8:11 AM MRN: [id number] Date of Birth: [birth year] Attending MD: Carlin POUR. Cindie , [place], [id number] CSN: [id number] Age: 78 Admit Type: Outpatient Procedure:                Colonoscopy Indications:              High risk colon cancer surveillance: Personal                            history of colonic polyps Providers:                Carlin POUR. Cindie, DO, Jessica Boudreaux, Jon Loge, Angela A. Gerome RN, RN Referring MD:              Medicines:                See the Anesthesia note for documentation of the                            administered medications Complications:            No immediate complications. Estimated Blood Loss:     Estimated blood loss was minimal. Procedure:                Pre-Anesthesia Assessment:                           - The anesthesia plan was to use monitored                            anesthesia care (MAC).                           After obtaining informed consent, the colonoscope                            was passed under direct vision. Throughout the                            procedure, the patient's blood pressure, pulse, and                            oxygen saturations were monitored continuously. The                            [id number]-[id number] ([id number]) was introduced through the                            anus and advanced to the the cecum, identified by                            appendiceal orifice and ileocecal valve. The                            colonoscopy was performed without  difficulty. The                            patient tolerated the procedure well. The quality                            of the bowel preparation was evaluated using the                            BBPS [facility] Bowel Preparation Scale) with scores                            of: Right Colon = 3, Transverse Colon = 3 and Left                            Colon = 3  (entire mucosa seen well with no residual                            staining, small fragments of stool or opaque                            liquid). The total BBPS score equals 9. Scope In: 9:08:52 AM Scope Out: 9:23:03 AM Scope Withdrawal Time: 0 hours 12 minutes 24 seconds  Total Procedure Duration: 0 hours 14 minutes 11 seconds  Findings:      Non-bleeding internal hemorrhoids were found.      Scattered small-mouthed diverticula were found in the sigmoid colon,       descending colon and transverse colon.      A 5 mm polyp was found in the transverse colon. The polyp was sessile.       The polyp was removed with a cold snare. Resection and retrieval were       complete.      Four sessile polyps were found in the recto-sigmoid colon. The polyps       were 3 to 5 mm in size. These polyps were removed with a cold snare.       Resection and retrieval were complete.      The exam was otherwise without abnormality. Impression:               - Non-bleeding internal hemorrhoids.                           - Diverticulosis in the sigmoid colon, in the                            descending colon and in the transverse colon.                           - One 5 mm polyp in the transverse colon, removed                            with a cold snare. Resected and retrieved.                           -  Four 3 to 5 mm polyps at the recto-sigmoid colon,                            removed with a cold snare. Resected and retrieved.                           - The examination was otherwise normal. Moderate Sedation:      Per Anesthesia Care Recommendation:           - Patient has a contact number available for                            emergencies. The signs and symptoms of potential                            delayed complications were discussed with the                            patient. Return to normal activities tomorrow.                            Written discharge instructions were provided to  the                            patient.                           - Resume previous diet.                           - Continue present medications.                           - Await pathology results.                           - No repeat colonoscopy due to age.                           - Return to GI clinic in 6 months. Procedure Code(s):        --- Professional ---                           [phone], Colonoscopy, flexible; with removal of                            tumor(s), polyp(s), or other lesion(s) by snare                            technique Diagnosis Code(s):        --- Professional ---                           Z86.010, Personal history of colonic polyps  K64.8, Other hemorrhoids                           D12.3, Benign neoplasm of transverse colon (hepatic                            flexure or splenic flexure)                           D12.7, Benign neoplasm of rectosigmoid junction                           K57.30, Diverticulosis of large intestine without                            perforation or abscess without bleeding CPT copyright 2022 American Medical Association. All rights reserved. The codes documented in this report are preliminary and upon coder review may  be revised to meet current compliance requirements. Carlin POUR. Cindie, DO Carlin POUR. Cindie, DO 04/14/2024 9:29:17 AM This report has been signed electronically. Number of Addenda: 0

## 2024-04-14 NOTE — Anesthesia Postprocedure Evaluation (Signed)
 Anesthesia Post Note  Patient: Jasmine Rhodes  Procedure(s) Performed: COLONOSCOPY  Patient location during evaluation: Phase II Anesthesia Type: General Level of consciousness: awake Pain management: pain level controlled Vital Signs Assessment: post-procedure vital signs reviewed and stable Respiratory status: spontaneous breathing and respiratory function stable Cardiovascular status: blood pressure returned to baseline and stable Postop Assessment: no headache and no apparent nausea or vomiting Anesthetic complications: no Comments: Late entry   No notable events documented.   Last Vitals:  Vitals:   04/14/24 0822 04/14/24 0926  BP: 135/78 115/79  Pulse: 79 75  Resp: 16 14  Temp: 36.5 C 36.9 C  SpO2: 100% 100%    Last Pain:  Vitals:   04/14/24 0926  TempSrc: Oral  PainSc: 0-No pain                 Yvonna JINNY Bosworth

## 2024-04-15 ENCOUNTER — Ambulatory Visit: Payer: Self-pay | Admitting: Internal Medicine

## 2024-04-15 LAB — SURGICAL PATHOLOGY

## 2024-04-16 ENCOUNTER — Encounter (HOSPITAL_COMMUNITY): Payer: Self-pay | Admitting: Internal Medicine

## 2024-04-20 ENCOUNTER — Ambulatory Visit (HOSPITAL_COMMUNITY)
Admission: RE | Admit: 2024-04-20 | Discharge: 2024-04-20 | Disposition: A | Discharge status: Home / Self Care | Source: Ambulatory Visit | Attending: Internal Medicine | Admitting: Internal Medicine

## 2024-04-20 DIAGNOSIS — M85852 Other specified disorders of bone density and structure, left thigh: Secondary | ICD-10-CM | POA: Diagnosis not present

## 2024-04-20 DIAGNOSIS — Z13820 Encounter for screening for osteoporosis: Secondary | ICD-10-CM | POA: Diagnosis not present

## 2024-04-20 DIAGNOSIS — Z1231 Encounter for screening mammogram for malignant neoplasm of breast: Secondary | ICD-10-CM | POA: Insufficient documentation

## 2024-04-20 DIAGNOSIS — Z780 Asymptomatic menopausal state: Secondary | ICD-10-CM | POA: Diagnosis not present

## 2024-04-20 DIAGNOSIS — M8589 Other specified disorders of bone density and structure, multiple sites: Secondary | ICD-10-CM | POA: Diagnosis not present

## 2024-04-20 DIAGNOSIS — M8580 Other specified disorders of bone density and structure, unspecified site: Secondary | ICD-10-CM

## 2024-04-20 DIAGNOSIS — M85851 Other specified disorders of bone density and structure, right thigh: Secondary | ICD-10-CM | POA: Diagnosis not present

## 2024-04-29 ENCOUNTER — Ambulatory Visit: Payer: Self-pay | Admitting: Allergy & Immunology

## 2024-04-29 ENCOUNTER — Other Ambulatory Visit: Payer: Self-pay

## 2024-04-29 ENCOUNTER — Encounter: Payer: Self-pay | Admitting: Allergy & Immunology

## 2024-04-29 VITALS — BP 100/62 | HR 73 | Temp 97.6°F | Resp 18 | Ht 62.99 in | Wt 146.0 lb

## 2024-04-29 DIAGNOSIS — G4733 Obstructive sleep apnea (adult) (pediatric): Secondary | ICD-10-CM

## 2024-04-29 DIAGNOSIS — J4540 Moderate persistent asthma, uncomplicated: Secondary | ICD-10-CM

## 2024-04-29 DIAGNOSIS — J310 Chronic rhinitis: Secondary | ICD-10-CM

## 2024-04-29 MED ORDER — ALBUTEROL SULFATE HFA 108 (90 BASE) MCG/ACT IN AERS: 2.0000 | INHALATION_SPRAY | Freq: Four times a day (QID) | RESPIRATORY_TRACT | 3 refills | Status: AC | PRN

## 2024-04-29 NOTE — Patient Instructions (Addendum)
 1. OSA (obstructive sleep apnea) - Continue to use your CPAP as tolerated.  2. Moderate persistent asthma, uncomplicated - already hooked into [place] and [place]  - Lung testing looks excellent today. - We are not going to make any medication changes.  - Breztri is an excellent medication. - Refill sent in for albuterol . - Spacer use reviewed. - Daily controller medication(s): Breztri 160/9/4.53mcg two puffs twice daily - Prior to physical activity: albuterol  2 puffs 10-15 minutes before physical activity. - Rescue medications: albuterol  4 puffs every 4-6 hours as needed - Asthma control goals:  * Full participation in all desired activities (may need albuterol  before activity) * Albuterol  use two time or less a week on average (not counting use with activity) * Cough interfering with sleep two time or less a month * Oral steroids no more than once a year * No hospitalizations  3. Chronic rhinitis - with exquisite sensitivity to the anticholinergic side effects of antihistamines (dry mouth etc).  - Because of insurance stipulations, we cannot do skin testing on the same day as your first visit. - We are all working to fight this, but for now we need to do two separate visits.  - We will know more after we do testing at the next visit.  - The skin testing visit can be squeezed in at your convenience.  - Then we can make a more full plan to address all of your symptoms. - Be sure to stop your antihistamines for 3 days before this appointment (Zyrtec). - You can remain on the montelukast even with the skin.   4. Return in about 1 week (around 05/06/2024) for ALLERGY TESTING. You can have the follow up appointment with Dr. Iva or a Nurse Practicioner (our Nurse Practitioners are excellent and always have Physician oversight!).    Please inform us  of any Emergency Department visits, hospitalizations, or changes in symptoms. Call us  before going to the ED for breathing or allergy symptoms since  we might be able to fit you in for a sick visit. Feel free to contact us  anytime with any questions, problems, or concerns.  It was a pleasure to meet you today! You are an absolute delight!   Websites that have reliable patient information: 1. American Academy of Asthma, Allergy, and Immunology: www.aaaai.org 2. Food Allergy Research and Education (FARE): foodallergy.org 3. Mothers of Asthmatics: http://www.asthmacommunitynetwork.org 4. American College of Allergy, Asthma, and Immunology: www.acaai.org      "Like" us  on Facebook and Instagram for our latest updates!      A healthy democracy works best when [employer] participate! Make sure you are registered to vote! If you have moved or changed any of your contact information, you will need to get this updated before voting! Scan the QR codes below to learn more!

## 2024-04-29 NOTE — Progress Notes (Signed)
 NEW PATIENT  Date of Service/Encounter:  04/29/24  Consult requested by: Shona Norleen PEDLAR, MD   Assessment:   Chronic rhinitis - planning for skin testing at the next visit  OSA (obstructive sleep apnea)  Moderate persistent asthma, uncomplicated   Plan/Recommendations:   1. OSA (obstructive sleep apnea) - Continue to use your CPAP as tolerated.  2. Moderate persistent asthma, uncomplicated - already hooked into [facility]  - Lung testing looks excellent today. - We are not going to make any medication changes.  - Breztri is an excellent medication. - Refill sent in for albuterol . - Spacer use reviewed. - Daily controller medication(s): Breztri 160/9/4.7mcg two puffs twice daily - Prior to physical activity: albuterol  2 puffs 10-15 minutes before physical activity. - Rescue medications: albuterol  4 puffs every 4-6 hours as needed - Asthma control goals:  * Full participation in all desired activities (may need albuterol  before activity) * Albuterol  use two time or less a week on average (not counting use with activity) * Cough interfering with sleep two time or less a month * Oral steroids no more than once a year * No hospitalizations  3. Chronic rhinitis - with exquisite sensitivity to the anticholinergic side effects of antihistamines (dry mouth etc).  - Because of insurance stipulations, we cannot do skin testing on the same day as your first visit. - We are all working to fight this, but for now we need to do two separate visits.  - We will know more after we do testing at the next visit.  - The skin testing visit can be squeezed in at your convenience.  - Then we can make a more full plan to address all of your symptoms. - Be sure to stop your antihistamines for 3 days before this appointment (Zyrtec). - You can remain on the montelukast even with the skin.   4. Return in about 1 week (around 05/06/2024) for ALLERGY TESTING. You can have the follow up appointment with  Dr. Iva or a Nurse Practicioner (our Nurse Practitioners are excellent and always have Physician oversight!).    This note in its entirety was forwarded to the Provider who requested this consultation.  Subjective:   Jasmine Rhodes is a 78 y.o. female presenting today for evaluation of  Chief Complaint  Patient presents with   Allergies    Sinus issue, itchy eyes, runny nose     Jasmine Rhodes has a history of the following: Patient Active Problem List   Diagnosis Date Noted   Coronary artery calcification seen on CT scan 08/03/2022   Centrilobular emphysema (HCC) 08/03/2022   Bleeding internal hemorrhoids 07/06/2020   GERD (gastroesophageal reflux disease) 04/20/2020   Abdominal bloating 04/20/2020   Gastritis and gastroduodenitis    History of colonic polyps 03/11/2017   Chronic RLQ pain 03/11/2017   Abdominal pain, epigastric 03/11/2017   Dysphagia 03/11/2017   Essential hypertension 01/11/2017   Overweight 01/11/2017   Hypercholesterolemia 01/11/2017   Allergic rhinitis 01/11/2017   Mild intermittent asthma 01/11/2017   Osteoarthritis of both knees 01/11/2017   Carpal tunnel syndrome, right 01/11/2017   Chest pain 01/11/2017   Stress 01/11/2017   Lumbago with sciatica, right side 01/11/2017    History obtained from: chart review and patient.  Discussed the use of AI scribe software for clinical note transcription with the patient and/or guardian, who gave verbal consent to proceed.  Jasmine Rhodes was referred by Shona Norleen PEDLAR, MD.  Jasmine Rhodes is a 78 y.o. female presenting for an evaluation of environmental allergies and asthma.   Asthma/Respiratory Symptom History: She uses Breztri, taking two puffs in the morning and two puffs at night. Previously, she used albuterol  as needed but has not been using it regularly since starting Breztri. Breztri is provided through a program due to its high cost, and she receives it by mail every three  months. She experiences dry mouth but no frequent coughing at night. She avoids using antihistamines daily due to their drying effects. She takes montelukast and Zyrtec, although she tries to minimize daily medication use.  She has a long-standing history of sleep apnea, diagnosed over thirty years ago in [place] . Initially, she used a CPAP machine but faced difficulties adjusting it after relocating to [place] . Recently, she acquired a new machine and has been using it nightly.  Allergic Rhinitis Symptom History: She has a history of sinus issues and was previously prescribed azelastine , which she discontinued due to nasal dryness. She now uses an over-the-counter nasal spray. She recalls having allergy testing done in [place]  in her early 30s, but not since moving to  . She has not had recent skin testing for allergies.  Skin Symptom History: She has a history of using prednisone  for sinus issues and insect bites, including a spider bite that caused significant swelling and blistering.  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 She is currently seeking employment. She has two grown children and one granddaughter, with one daughter living nearby and the other daughter and granddaughter in [place] .   Otherwise, there is no history of other atopic diseases, including drug allergies, stinging insect allergies, or contact dermatitis. There is no significant infectious history. Vaccinations are up to date.    Past Medical History: Patient Active Problem List   Diagnosis Date Noted   Coronary artery calcification seen on CT scan 08/03/2022   Centrilobular emphysema (HCC) 08/03/2022   Bleeding internal hemorrhoids 07/06/2020   GERD (gastroesophageal reflux disease) 04/20/2020   Abdominal bloating 04/20/2020   Gastritis and gastroduodenitis    History of colonic polyps 03/11/2017   Chronic RLQ pain 03/11/2017    Abdominal pain, epigastric 03/11/2017   Dysphagia 03/11/2017   Essential hypertension 01/11/2017   Overweight 01/11/2017   Hypercholesterolemia 01/11/2017   Allergic rhinitis 01/11/2017   Mild intermittent asthma 01/11/2017   Osteoarthritis of both knees 01/11/2017   Carpal tunnel syndrome, right 01/11/2017   Chest pain 01/11/2017   Stress 01/11/2017   Lumbago with sciatica, right side 01/11/2017    Medication List:  Allergies as of 04/29/2024       Reactions   Sulfa Antibiotics Rash        Medication List        Accurate as of April 29, 2024 11:56 AM. If you have any questions, ask your nurse or doctor.          STOP taking these medications    azelastine  0.1 % nasal spray Commonly known as: ASTELIN  Stopped by: Marty Morton Shaggy   b complex vitamins capsule Stopped by: Marty Morton Shaggy   furosemide 20 MG tablet Commonly known as: LASIX Stopped by: Marty Morton Shaggy       TAKE these medications    acetaminophen 500 MG tablet Commonly known as: TYLENOL Take 1,000 mg by mouth every 6 (six) hours as needed for moderate pain or headache.   albuterol  108 (90 Base) MCG/ACT inhaler  Commonly known as: VENTOLIN  HFA Inhale 2 puffs into the lungs every 6 (six) hours as needed for wheezing or shortness of breath.   aspirin EC 81 MG tablet Take 81 mg by mouth daily.   atenolol 50 MG tablet Commonly known as: TENORMIN Take 1 tablet by mouth daily.   bismuth subsalicylate 262 MG/15ML suspension Commonly known as: PEPTO BISMOL Take 30 mLs by mouth every 6 (six) hours as needed for diarrhea or loose stools.   budesonide-glycopyrrolate -formoterol 160-9-4.8 MCG/ACT Aero inhaler Commonly known as: BREZTRI Inhale 2 puffs into the lungs 2 (two) times daily.   cetirizine 10 MG tablet Commonly known as: ZYRTEC Take 10 mg by mouth daily as needed for allergies.   cholecalciferol 25 MCG (1000 UNIT) tablet Commonly known as: VITAMIN D3 Take 1,000 Units  by mouth daily.   clobetasol cream 0.05 % Commonly known as: TEMOVATE SMARTSIG:sparingly Topical Twice Daily   diclofenac  sodium 1 % Gel Commonly known as: VOLTAREN  Apply 2 g topically daily as needed (for knee pain).   montelukast 10 MG tablet Commonly known as: SINGULAIR Take 10 mg by mouth daily.   multivitamin with minerals Tabs tablet Take 1 tablet by mouth daily.   omeprazole  40 MG capsule Commonly known as: PRILOSEC Take 1 capsule (40 mg total) by mouth 2 (two) times daily before a meal. Take twice daily for 4 weeks and then reduce to once daily.   potassium chloride 10 MEQ CR capsule Commonly known as: MICRO-K Take 10 mEq by mouth daily.   Restasis 0.05 % ophthalmic emulsion Generic drug: cycloSPORINE 1 drop 2 (two) times daily.   rosuvastatin  20 MG tablet Commonly known as: CRESTOR  TAKE 1 TABLET(20 MG) BY MOUTH DAILY   triamcinolone  cream 0.1 % Commonly known as: KENALOG  Apply 1 Application topically 2 (two) times daily.   triamterene-hydrochlorothiazide 37.5-25 MG capsule Commonly known as: DYAZIDE Take 1 capsule by mouth daily.   vitamin C 1000 MG tablet Take 1,000 mg by mouth daily.        Birth History: non-contributory  Developmental History: non-contributory  Past Surgical History: Past Surgical History:  Procedure Laterality Date   BALLOON DILATION N/A 05/10/2020   Procedure: BALLOON DILATION;  Surgeon: Cindie Carlin POUR, DO;  [facility];  Service: Endoscopy;  Laterality: N/A;   BIOPSY  05/10/2020   Procedure: BIOPSY;  Surgeon: Cindie Carlin POUR, DO;  [facility];  Service: Endoscopy;;   BREAST BIOPSY Right    BUNIONECTOMY Bilateral 2015   CARPAL TUNNEL RELEASE Right 03/30/2017   COLONOSCOPY     COLONOSCOPY N/A 04/25/2017   Procedure: COLONOSCOPY;  Surgeon: Harvey Margo CROME, MD;  [facility];  Service: Endoscopy;  Laterality: N/A;  1200   COLONOSCOPY N/A 04/14/2024   Procedure: COLONOSCOPY;  Surgeon:  Cindie Carlin POUR, DO;  [facility];  Service: Endoscopy;  Laterality: N/A;  930AM, OK RM 1-2   DG THUMB LEFT HAND     ESOPHAGOGASTRODUODENOSCOPY N/A 04/25/2017   Procedure: ESOPHAGOGASTRODUODENOSCOPY (EGD);  Surgeon: Harvey Margo CROME, MD;  [facility];  Service: Endoscopy;  Laterality: N/A;   ESOPHAGOGASTRODUODENOSCOPY (EGD) WITH PROPOFOL  N/A 05/10/2020   Procedure: ESOPHAGOGASTRODUODENOSCOPY (EGD) WITH PROPOFOL ;  Surgeon: Cindie Carlin POUR, DO;  [facility];  Service: Endoscopy;  Laterality: N/A;  2:00pm   SAVORY DILATION N/A 04/25/2017   Procedure: SAVORY DILATION;  Surgeon: Harvey Margo CROME, MD;  [facility];  Service: Endoscopy;  Laterality: N/A;   TONSILLECTOMY N/A  1953   tonsils removal       Family History: Family History  Problem Relation Age of Onset   Hypertension Mother    Breast cancer Maternal Grandmother    Colon cancer Brother 28       Half-brother related by her mother     Social History: Yomaris lives at home with her family.  She lives in a house of unknown age.  There is wood throughout the home.  They have gas heating and central cooling.  There is 1 cat inside of the home.  There are no dust mite covers in the bedding.  There is no tobacco exposure.  There is no fume, chemical, or dust exposure.  There is a HEPA filter in the home.  She does not [place] near an interstate or industrial area.   Review of systems otherwise negative other than that mentioned in the HPI.    Objective:   Blood pressure 100/62, pulse 73, temperature 97.6 F (36.4 C), temperature source Temporal, resp. rate 18, height 5' 2.99 (1.6 m), weight 146 lb (66.2 kg), SpO2 96%. Body mass index is 25.87 kg/m.     Physical Exam Vitals reviewed.  Constitutional:      Appearance: She is well-developed.  HENT:     Head: Normocephalic and atraumatic.     Right Ear: Tympanic membrane, ear canal and external ear normal. No drainage, swelling or  tenderness. Tympanic membrane is not injected, scarred, erythematous, retracted or bulging.     Left Ear: Tympanic membrane, ear canal and external ear normal. No drainage, swelling or tenderness. Tympanic membrane is not injected, scarred, erythematous, retracted or bulging.     Nose: No nasal deformity, septal deviation, mucosal edema or rhinorrhea.     Right Sinus: No maxillary sinus tenderness or frontal sinus tenderness.     Left Sinus: No maxillary sinus tenderness or frontal sinus tenderness.     Mouth/Throat:     Mouth: Mucous membranes are not pale and not dry.     Pharynx: Uvula midline.  Eyes:     General:        Right eye: No discharge.        Left eye: No discharge.     Conjunctiva/sclera: Conjunctivae normal.     Right eye: Right conjunctiva is not injected. No chemosis.    Left eye: Left conjunctiva is not injected. No chemosis.    Pupils: Pupils are equal, round, and reactive to light.  Cardiovascular:     Rate and Rhythm: Normal rate and regular rhythm.     Heart sounds: Normal heart sounds.  Pulmonary:     Effort: Pulmonary effort is normal. No tachypnea, accessory muscle usage or respiratory distress.     Breath sounds: Normal breath sounds. No wheezing, rhonchi or rales.  Chest:     Chest wall: No tenderness.  Abdominal:     Tenderness: There is no abdominal tenderness. There is no guarding or rebound.  Lymphadenopathy:     Head:     Right side of head: No submandibular, tonsillar or occipital adenopathy.     Left side of head: No submandibular, tonsillar or occipital adenopathy.     Cervical: No cervical adenopathy.  Skin:    Coloration: Skin is not pale.     Findings: No abrasion, erythema, petechiae or rash. Rash is not papular, urticarial or vesicular.  Neurological:     Mental Status: She is alert.      Diagnostic studies:    Spirometry:  results normal (FEV1: 1.59/95%, FVC: 1.99/92%, FEV1/FVC: 80%).    Spirometry consistent with normal pattern.    Allergy Studies: deferred due to insurance stipulations that require a separate visit for testing          Marty Shaggy, MD [facility] 

## 2024-05-13 ENCOUNTER — Encounter: Payer: Self-pay | Admitting: Allergy & Immunology

## 2024-05-13 ENCOUNTER — Ambulatory Visit: Admitting: Allergy & Immunology

## 2024-05-13 DIAGNOSIS — J3089 Other allergic rhinitis: Secondary | ICD-10-CM

## 2024-05-13 DIAGNOSIS — J302 Other seasonal allergic rhinitis: Secondary | ICD-10-CM | POA: Diagnosis not present

## 2024-05-13 MED ORDER — MONTELUKAST SODIUM 10 MG PO TABS: 10.0000 mg | ORAL_TABLET | Freq: Every day | ORAL | 1 refills | Status: AC

## 2024-05-13 NOTE — Patient Instructions (Addendum)
 1. OSA (obstructive sleep apnea) - Continue to use your CPAP as tolerated.  2. Moderate persistent asthma, uncomplicated - already hooked into [place] [facility]  - Lung testing looked excellent at the last visit.  - Spacer use reviewed. - Daily controller medication(s): Breztri 160/9/4.73mcg two puffs twice daily - Prior to physical activity: albuterol  2 puffs 10-15 minutes before physical activity. - Rescue medications: albuterol  4 puffs every 4-6 hours as needed - Asthma control goals:  * Full participation in all desired activities (may need albuterol  before activity) * Albuterol  use two time or less a week on average (not counting use with activity) * Cough interfering with sleep two time or less a month * Oral steroids no more than once a year * No hospitalizations  3. Chronic rhinitis - with exquisite sensitivity to the anticholinergic side effects of antihistamines (dry mouth etc).  - Testing today showed: grasses, ragweed, weeds, trees, indoor molds, outdoor molds, dust mites, and cat - Copy of test results provided.  - Avoidance measures provided. - We are going to try to avoid antihistamines since they cause the dry mouth.  - Continue with: Singulair (montelukast) 10mg  daily and Flonase (fluticasone) one spray per nostril daily (AIM FOR EAR ON EACH SIDE) - You can use an extra dose of the antihistamine, if needed, for breakthrough symptoms.  - Consider nasal saline rinses 1-2 times daily to remove allergens from the nasal cavities as well as help with mucous clearance (this is especially helpful to do before the nasal sprays are given) - Strongly consider allergy shots as a means of long-term control. - Allergy shots re-train and reset the immune system to ignore environmental allergens and decrease the resulting immune response to those allergens (sneezing, itchy watery eyes, runny nose, nasal congestion, etc).    - Allergy shots improve symptoms in 75-85% of patients.  - We can  discuss more at the next appointment if the medications are not working for you.  4. Return in about 3 months (around 08/13/2024). You can have the follow up appointment with Dr. Iva or a Nurse Practicioner (our Nurse Practitioners are excellent and always have Physician oversight!).    Please inform us  of any Emergency Department visits, hospitalizations, or changes in symptoms. Call us  before going to the ED for breathing or allergy symptoms since we might be able to fit you in for a sick visit. Feel free to contact us  anytime with any questions, problems, or concerns.  It was a pleasure to meet you today! You are an absolute delight!   Websites that have reliable patient information: 1. American Academy of Asthma, Allergy, and Immunology: www.aaaai.org 2. Food Allergy Research and Education (FARE): foodallergy.org 3. Mothers of Asthmatics: http://www.asthmacommunitynetwork.org 4. American College of Allergy, Asthma, and Immunology: www.acaai.org      "Like" us  on Facebook and Instagram for our latest updates!      A healthy democracy works best when [employer] participate! Make sure you are registered to vote! If you have moved or changed any of your contact information, you will need to get this updated before voting! Scan the QR codes below to learn more!         Airborne Adult Perc - 05/13/24 0957     Time Antigen Placed 9041    Allergen Manufacturer Jestine    Location Back    Number of Test 55    1. Control-Buffer 50% Glycerol Negative    2. Control-Histamine 2+    3. Bahia Negative  4. [place] Negative    5. Johnson Negative    6. [place]  Blue 2+    7. Meadow Fescue 2+    8. Perennial Rye 2+    9. Timothy 2+    10. Ragweed Mix Negative    11. Cocklebur Negative    12. Plantain,  English Negative    13. Baccharis Negative    14. Dog Fennel Negative    15. Russian Thistle Negative    16. Lamb's Quarters Negative    17. Sheep Sorrell Negative    18.  Rough Pigweed Negative    19. Marsh Elder, Rough Negative    20. Mugwort, Common Negative    21. Box, Elder Negative    22. Cedar, red Negative    23. Sweet Gum Negative    24. Pecan Pollen 3+    25. Pine Mix --   +/-   26. Walnut, Black Pollen 3+    27. Red Mulberry Negative    28. Ash Mix Negative    29. Birch Mix Negative    30. Beech American Negative    31. Cottonwood, [place] Negative    32. Hickory, White 3+    33. Maple Mix Negative    34. Oak, [place] Mix 3+    35. Sycamore Eastern Negative    36. Alternaria Alternata Negative    37. Cladosporium Herbarum Negative    38. Aspergillus Mix Negative    39. Penicillium Mix Negative    40. Bipolaris Sorokiniana (Helminthosporium) 2+    41. Drechslera Spicifera (Curvularia) Negative    42. Mucor Plumbeus Negative    43. Fusarium Moniliforme Negative    44. Aureobasidium Pullulans (pullulara) Negative    45. Rhizopus Oryzae Negative    46. Botrytis Cinera Negative    47. Epicoccum Nigrum Negative    48. Phoma Betae Negative    49. Dust Mite Mix 3+    50. Cat Hair 10,000 BAU/ml 3+    51.  Dog Epithelia Negative    52. Mixed Feathers Negative    53. Horse Epithelia Negative    54. Cockroach, German Negative    55. Tobacco Leaf Negative          Intradermal - 05/13/24 1022     Time Antigen Placed 1030    Location Arm    Number of Test 11    Control Negative    Bahia Negative    [place] Negative    Johnson Negative    Ragweed Mix 4+    Weed Mix Negative    Mold 1 Negative    Mold 2 Negative    Mold 4 3+    Dog Negative    Cockroach Negative          Reducing Pollen Exposure  The American Academy of Allergy, Asthma and Immunology suggests the following steps to reduce your exposure to pollen during allergy seasons.    Do not hang sheets or clothing out to dry; pollen may collect on these items. Do not mow lawns or spend time around freshly cut grass; mowing stirs up pollen. Keep windows closed at  night.  Keep car windows closed while driving. Minimize morning activities outdoors, a time when pollen counts are usually at their highest. Stay indoors as much as possible when pollen counts or humidity is high and on windy days when pollen tends to remain in the air longer. Use air conditioning when possible.  Many air conditioners have filters that trap the pollen spores. Use  a HEPA room air filter to remove pollen form the indoor air you breathe.  Control of Mold Allergen   Mold and fungi can grow on a variety of surfaces provided certain temperature and moisture conditions exist.  Outdoor molds grow on plants, decaying vegetation and soil.  The major outdoor mold, Alternaria and Cladosporium, are found in very high numbers during hot and dry conditions.  Generally, a late Summer - Fall peak is seen for common outdoor fungal spores.  Rain will temporarily lower outdoor mold spore count, but counts rise rapidly when the rainy period ends.  The most important indoor molds are Aspergillus and Penicillium.  Dark, humid and poorly ventilated basements are ideal sites for mold growth.  The next most common sites of mold growth are the bathroom and the kitchen.  Outdoor (Seasonal) Mold Control  Positive outdoor molds via skin testing: Bipolaris (Helminthsporium)  Use air conditioning and keep windows closed Avoid exposure to decaying vegetation. Avoid leaf raking. Avoid grain handling. Consider wearing a face mask if working in moldy areas.   Indoor (Perennial) Mold Control   Positive indoor molds via skin testing: Fusarium, Aureobasidium (Pullulara), and Rhizopus  Maintain humidity below 50%. Clean washable surfaces with 5% bleach solution. Remove sources e.g. contaminated carpets.    Control of Dust Mite Allergen    Dust mites play a major role in allergic asthma and rhinitis.  They occur in environments with high humidity wherever human skin is found.  Dust mites absorb humidity  from the atmosphere (ie, they do not drink) and feed on organic matter (including shed human and animal skin).  Dust mites are a microscopic type of insect that you cannot see with the naked eye.  High levels of dust mites have been detected from mattresses, pillows, carpets, upholstered furniture, bed covers, clothes, soft toys and any woven material.  The principal allergen of the dust mite is found in its feces.  A gram of dust may contain 1,000 mites and 250,000 fecal particles.  Mite antigen is easily measured in the air during house cleaning activities.  Dust mites do not bite and do not cause harm to humans, other than by triggering allergies/asthma.    Ways to decrease your exposure to dust mites in your home:  Encase mattresses, box springs and pillows with a mite-impermeable barrier or cover   Wash sheets, blankets and drapes weekly in hot water  (130 F) with detergent and dry them in a dryer on the hot setting.  Have the room cleaned frequently with a vacuum cleaner and a damp dust-mop.  For carpeting or rugs, vacuuming with a vacuum cleaner equipped with a high-efficiency particulate air (HEPA) filter.  The dust mite allergic individual should not be in a room which is being cleaned and should wait 1 hour after cleaning before going into the room. Do not sleep on upholstered furniture (eg, couches).   If possible removing carpeting, upholstered furniture and drapery from the home is ideal.  Horizontal blinds should be eliminated in the rooms where the person spends the most time (bedroom, study, television room).  Washable vinyl, roller-type shades are optimal. Remove all non-washable stuffed toys from the bedroom.  Wash stuffed toys weekly like sheets and blankets above.   Reduce indoor humidity to less than 50%.  Inexpensive humidity monitors can be purchased at most hardware stores.  Do not use a humidifier as can make the problem worse and are not recommended.  Control of Dog or Cat  Allergen  Avoidance is the best way to manage a dog or cat allergy. If you have a dog or cat and are allergic to dog or cats, consider removing the dog or cat from the home. If you have a dog or cat but don't want to find it a new home, or if your family wants a pet even though someone in the household is allergic, here are some strategies that may help keep symptoms at bay:  Keep the pet out of your bedroom and restrict it to only a few rooms. Be advised that keeping the dog or cat in only one room will not limit the allergens to that room. Don't pet, hug or kiss the dog or cat; if you do, wash your hands with soap and water . High-efficiency particulate air (HEPA) cleaners run continuously in a bedroom or living room can reduce allergen levels over time. Regular use of a high-efficiency vacuum cleaner or a central vacuum can reduce allergen levels. Giving your dog or cat a bath at least once a week can reduce airborne allergen.  Allergy Shots  Allergies are the result of a chain reaction that starts in the immune system. Your immune system controls how your body defends itself. For instance, if you have an allergy to pollen, your immune system identifies pollen as an invader or allergen. Your immune system overreacts by producing antibodies called Immunoglobulin E (IgE). These antibodies travel to cells that release chemicals, causing an allergic reaction.  The concept behind allergy immunotherapy, whether it is received in the form of shots or tablets, is that the immune system can be desensitized to specific allergens that trigger allergy symptoms. Although it requires time and patience, the payback can be long-term relief. Allergy injections contain a dilute solution of those substances that you are allergic to based upon your skin testing and allergy history.   How Do Allergy Shots Work?  Allergy shots work much like a vaccine. Your body responds to injected amounts of a particular allergen  given in increasing doses, eventually developing a resistance and tolerance to it. Allergy shots can lead to decreased, minimal or no allergy symptoms.  There generally are two phases: build-up and maintenance. Build-up often ranges from three to six months and involves receiving injections with increasing amounts of the allergens. The shots are typically given once or twice a week, though more rapid build-up schedules are sometimes used.  The maintenance phase begins when the most effective dose is reached. This dose is different for each person, depending on how allergic you are and your response to the build-up injections. Once the maintenance dose is reached, there are longer periods between injections, typically two to four weeks.  Occasionally doctors give cortisone-type shots that can temporarily reduce allergy symptoms. These types of shots are different and should not be confused with allergy immunotherapy shots.  Who Can Be Treated with Allergy Shots?  Allergy shots may be a good treatment approach for people with allergic rhinitis (hay fever), allergic asthma, conjunctivitis (eye allergy) or stinging insect allergy.   Before deciding to begin allergy shots, you should consider:   The length of allergy season and the severity of your symptoms  Whether medications and/or changes to your environment can control your symptoms  Your desire to avoid long-term medication use  Time: allergy immunotherapy requires a major time commitment  Cost: may vary depending on your insurance coverage  Allergy shots for children age 82 and older are effective and often well tolerated. They might prevent  the onset of new allergen sensitivities or the progression to asthma.  Allergy shots are not started on patients who are pregnant but can be continued on patients who become pregnant while receiving them. In some patients with other medical conditions or who take certain common medications, allergy  shots may be of risk. It is important to mention other medications you talk to your allergist.   What are the two types of build-ups offered:   RUSH or Rapid Desensitization -- one day of injections lasting from 8:30-4:30pm, injections every 1 hour.  Approximately half of the build-up process is completed in that one day.  The following week, normal build-up is resumed, and this entails ~16 visits either weekly or twice weekly, until reaching your "maintenance dose" which is continued weekly until eventually getting spaced out to every month for a duration of 3 to 5 years. The regular build-up appointments are nurse visits where the injections are administered, followed by required monitoring for 30 minutes.    Traditional build-up -- weekly visits for 6 -12 months until reaching "maintenance dose", then continue weekly until eventually spacing out to every 4 weeks as above. At these appointments, the injections are administered, followed by required monitoring for 30 minutes.     Either way is acceptable, and both are equally effective. With the rush protocol, the advantage is that less time is spent here for injections overall AND you would also reach maintenance dosing faster (which is when the clinical benefit starts to become more apparent). Not everyone is a candidate for rapid desensitization.   IF we proceed with the RUSH protocol, there are premedications which must be taken the day before and the day after the rush only (this includes antihistamines, steroids, and Singulair).  After the rush day, no prednisone  or Singulair is required, and we just recommend antihistamines taken on your injection day.  What Is An Estimate of the Costs?  If you are interested in starting allergy injections, please check with your insurance company about your coverage for both allergy vial sets and allergy injections.  Please do so prior to making the appointment to start injections.  The following are CPT  codes to give to your insurance company. These are the amounts we BILL to the insurance company, but the amount YOU WILL PAY and WE RECEIVE IS SUBSTANTIALLY LESS and depends on the contracts we have with different insurance companies.   Amount Billed to Insurance Two allergy vial set  CPT 95165   $ 2400     Two injections   CPT 95117   $ 40  Regarding the allergy injections, your co-pay may or may not apply with each injection, so please confirm this with your insurance company. When you start allergy injections, 1 or 2 sets of vials are made based on your allergies.  Not all patients can be on one set of vials. A set of vials lasts 6 months to a year depending on how quickly you can proceed with your build-up of your allergy injections. Vials are personalized for each patient depending on their specific allergens.  How often are allergy injection given during the build-up period?   Injections are given at least weekly during the build-up period until your maintenance dose is achieved. Per the doctor's discretion, you may have the option of getting allergy injections two times per week during the build-up period. However, there must be at least 48 hours between injections. The build-up period is usually completed within 6-12 months depending  on your ability to schedule injections and for adjustments for reactions. When maintenance dose is reached, your injection schedule is gradually changed to every two weeks and later to every three weeks. Injections will then continue every 4 weeks. Usually, injections are continued for a total of 3-5 years.   When Will I Feel Better?  Some may experience decreased allergy symptoms during the build-up phase. For others, it may take as long as 12 months on the maintenance dose. If there is no improvement after a year of maintenance, your allergist will discuss other treatment options with you.  If you aren't responding to allergy shots, it may be because there is  not enough dose of the allergen in your vaccine or there are missing allergens that were not identified during your allergy testing. Other reasons could be that there are high levels of the allergen in your environment or major exposure to non-allergic triggers like tobacco smoke.  What Is the Length of Treatment?  Once the maintenance dose is reached, allergy shots are generally continued for three to five years. The decision to stop should be discussed with your allergist at that time. Some people may experience a permanent reduction of allergy symptoms. Others may relapse and a longer course of allergy shots can be considered.  What Are the Possible Reactions?  The two types of adverse reactions that can occur with allergy shots are local and systemic. Common local reactions include very mild redness and swelling at the injection site, which can happen immediately or several hours after. Report a delayed reaction from your last injection. These include arm swelling or runny nose, watery eyes or cough that occurs within 12-24 hours after injection. A systemic reaction, which is less common, affects the entire body or a particular body system. They are usually mild and typically respond quickly to medications. Signs include increased allergy symptoms such as sneezing, a stuffy nose or hives.   Rarely, a serious systemic reaction called anaphylaxis can develop. Symptoms include swelling in the throat, wheezing, a feeling of tightness in the chest, nausea or dizziness. Most serious systemic reactions develop within 30 minutes of allergy shots. This is why it is strongly recommended you wait in your doctor's office for 30 minutes after your injections. Your allergist is trained to watch for reactions, and his or her staff is trained and equipped with the proper medications to identify and treat them.   Report to the nurse immediately if you experience any of the following symptoms: swelling, itching or  redness of the skin, hives, watery eyes/nose, breathing difficulty, excessive sneezing, coughing, stomach pain, diarrhea, or light headedness. These symptoms may occur within 15-20 minutes after injection and may require medication.   Who Should Administer Allergy Shots?  The preferred location for receiving shots is your prescribing allergist's office. Injections can sometimes be given at another facility where the physician and staff are trained to recognize and treat reactions, and have received instructions by your prescribing allergist.  What if I am late for an injection?   Injection dose will be adjusted depending upon how many days or weeks you are late for your injection.   What if I am sick?   Please report any illness to the nurse before receiving injections. She may adjust your dose or postpone injections depending on your symptoms. If you have fever, flu, sinus infection or chest congestion it is best to postpone allergy injections until you are better. Never get an allergy injection if your asthma  is causing you problems. If your symptoms persist, seek out medical care to get your health problem under control.  What If I am or Become Pregnant:  Women that become pregnant should schedule an appointment with [facility] before receiving any further allergy injections.

## 2024-05-13 NOTE — Progress Notes (Signed)
 FOLLOW UP  Date of Service/Encounter:  05/13/24   Assessment:   Seasonal and perennial allergic rhinitis (grasses, ragweed, weeds, trees, indoor molds, outdoor molds, dust mites, and cat)  OSA (obstructive sleep apnea)   Moderate persistent asthma, uncomplicated   Plan/Recommendations:   1. OSA (obstructive sleep apnea) - Continue to use your CPAP as tolerated.  2. Moderate persistent asthma, uncomplicated - already hooked into [place] and [place]  - Lung testing looked excellent at the last visit.  - Spacer use reviewed. - Daily controller medication(s): Breztri 160/9/4.44mcg two puffs twice daily - Prior to physical activity: albuterol  2 puffs 10-15 minutes before physical activity. - Rescue medications: albuterol  4 puffs every 4-6 hours as needed - Asthma control goals:  * Full participation in all desired activities (may need albuterol  before activity) * Albuterol  use two time or less a week on average (not counting use with activity) * Cough interfering with sleep two time or less a month * Oral steroids no more than once a year * No hospitalizations  3. Chronic rhinitis - with exquisite sensitivity to the anticholinergic side effects of antihistamines (dry mouth etc).  - Testing today showed: grasses, ragweed, weeds, trees, indoor molds, outdoor molds, dust mites, and cat - Copy of test results provided.  - Avoidance measures provided. - We are going to try to avoid antihistamines since they cause the dry mouth.  - Continue with: Singulair (montelukast) 10mg  daily and Flonase (fluticasone) one spray per nostril daily (AIM FOR EAR ON EACH SIDE) - You can use an extra dose of the antihistamine, if needed, for breakthrough symptoms.  - Consider nasal saline rinses 1-2 times daily to remove allergens from the nasal cavities as well as help with mucous clearance (this is especially helpful to do before the nasal sprays are given) - Strongly consider allergy shots as a means of  long-term control. - Allergy shots re-train and reset the immune system to ignore environmental allergens and decrease the resulting immune response to those allergens (sneezing, itchy watery eyes, runny nose, nasal congestion, etc).    - Allergy shots improve symptoms in 75-85% of patients.  - We can discuss more at the next appointment if the medications are not working for you.  4. Return in about 3 months (around 08/13/2024). You can have the follow up appointment with Dr. Iva or a Nurse Practicioner (our Nurse Practitioners are excellent and always have Physician oversight!).    Subjective:   Zaia Carre is a 78 y.o. female presenting today for follow up of  Chief Complaint  Patient presents with   Allergy Testing    1-55    Kyren Vaux has a history of the following: Patient Active Problem List   Diagnosis Date Noted   Coronary artery calcification seen on CT scan 08/03/2022   Centrilobular emphysema (HCC) 08/03/2022   Bleeding internal hemorrhoids 07/06/2020   GERD (gastroesophageal reflux disease) 04/20/2020   Abdominal bloating 04/20/2020   Gastritis and gastroduodenitis    History of colonic polyps 03/11/2017   Chronic RLQ pain 03/11/2017   Abdominal pain, epigastric 03/11/2017   Dysphagia 03/11/2017   Essential hypertension 01/11/2017   Overweight 01/11/2017   Hypercholesterolemia 01/11/2017   Allergic rhinitis 01/11/2017   Mild intermittent asthma 01/11/2017   Osteoarthritis of both knees 01/11/2017   Carpal tunnel syndrome, right 01/11/2017   Chest pain 01/11/2017   Stress 01/11/2017   Lumbago with sciatica, right side 01/11/2017    History obtained from: chart review and  patient.  Discussed the use of AI scribe software for clinical note transcription with the patient and/or guardian, who gave verbal consent to proceed.  Doyce is a 78 y.o. female presenting for skin testing. She was last seen on October 1st. We could not do  testing because her insurance company does not cover testing on the same day as a New Patient visit. She has been off of all antihistamines 3 days in anticipation of the testing.   At that visit, we decided to do environmental allergy testing.  Lung testing was excellent.  We continue with Breztri 2 puffs twice daily and gave her an [place] form to fill out.  For her rhinitis, we continue with Zyrtec and send the environmental allergy testing.  She was having a lot of dry mouth secondary to the antihistamines.  Otherwise, there have been no changes to her past medical history, surgical history, family history, or social history.    Review of systems otherwise negative other than that mentioned in the HPI.    Objective:   There were no vitals taken for this visit. There is no height or weight on file to calculate BMI.    Physical exam deferred since this was a skin testing appointment only.   Diagnostic studies:   Allergy Studies:     Airborne Adult Perc - 05/13/24 0957     Time Antigen Placed 9041    Allergen Manufacturer Jestine    Location Back    Number of Test 55    1. Control-Buffer 50% Glycerol Negative    2. Control-Histamine 2+    3. Bahia Negative    4. French Southern Territories Negative    5. Johnson Negative    6. [place]  Blue 2+    7. Meadow Fescue 2+    8. Perennial Rye 2+    9. Timothy 2+    10. Ragweed Mix Negative    11. Cocklebur Negative    12. Plantain,  English Negative    13. Baccharis Negative    14. Dog Fennel Negative    15. Russian Thistle Negative    16. Lamb's Quarters Negative    17. Sheep Sorrell Negative    18. Rough Pigweed Negative    19. Marsh Elder, Rough Negative    20. Mugwort, Common Negative    21. Box, Elder Negative    22. Cedar, red Negative    23. Sweet Gum Negative    24. Pecan Pollen 3+    25. Pine Mix --   +/-   26. Walnut, Black Pollen 3+    27. Red Mulberry Negative    28. Ash Mix Negative    29. Birch Mix Negative    30. Beech  American Negative    31. Cottonwood, Guinea-Bissau Negative    32. Hickory, White 3+    33. Maple Mix Negative    34. Oak, [place] Mix 3+    35. Sycamore Eastern Negative    36. Alternaria Alternata Negative    37. Cladosporium Herbarum Negative    38. Aspergillus Mix Negative    39. Penicillium Mix Negative    40. Bipolaris Sorokiniana (Helminthosporium) 2+    41. Drechslera Spicifera (Curvularia) Negative    42. Mucor Plumbeus Negative    43. Fusarium Moniliforme Negative    44. Aureobasidium Pullulans (pullulara) Negative    45. Rhizopus Oryzae Negative    46. Botrytis Cinera Negative    47. Epicoccum Nigrum Negative    48. Phoma Betae Negative  49. Dust Mite Mix 3+    50. Cat Hair 10,000 BAU/ml 3+    51.  Dog Epithelia Negative    52. Mixed Feathers Negative    53. Horse Epithelia Negative    54. Cockroach, German Negative    55. Tobacco Leaf Negative          Intradermal - 05/13/24 1022     Time Antigen Placed 1030    Location Arm    Number of Test 11    Control Negative    Bahia Negative    French Southern Territories Negative    Johnson Negative    Ragweed Mix 4+    Weed Mix Negative    Mold 1 Negative    Mold 2 Negative    Mold 4 3+    Dog Negative    Cockroach Negative          Allergy testing results were read and interpreted by myself, documented by clinical staff.      Marty Shaggy, MD  [facility] 

## 2024-05-14 DIAGNOSIS — L811 Chloasma: Secondary | ICD-10-CM | POA: Diagnosis not present

## 2024-06-06 ENCOUNTER — Other Ambulatory Visit: Payer: Self-pay

## 2024-06-06 ENCOUNTER — Emergency Department (HOSPITAL_COMMUNITY)

## 2024-06-06 ENCOUNTER — Encounter (HOSPITAL_COMMUNITY): Payer: Self-pay

## 2024-06-06 ENCOUNTER — Emergency Department (HOSPITAL_COMMUNITY)
Admission: EM | Admit: 2024-06-06 | Discharge: 2024-06-07 | Disposition: A | Discharge status: Home / Self Care | Attending: Emergency Medicine | Admitting: Emergency Medicine

## 2024-06-06 DIAGNOSIS — R079 Chest pain, unspecified: Secondary | ICD-10-CM | POA: Insufficient documentation

## 2024-06-06 DIAGNOSIS — Z7982 Long term (current) use of aspirin: Secondary | ICD-10-CM | POA: Diagnosis not present

## 2024-06-06 DIAGNOSIS — E876 Hypokalemia: Secondary | ICD-10-CM | POA: Insufficient documentation

## 2024-06-06 DIAGNOSIS — R600 Localized edema: Secondary | ICD-10-CM | POA: Insufficient documentation

## 2024-06-06 DIAGNOSIS — M7989 Other specified soft tissue disorders: Secondary | ICD-10-CM | POA: Diagnosis present

## 2024-06-06 LAB — CBC
HCT: 39.2 % (ref 36.0–46.0)
Hemoglobin: 13.3 g/dL (ref 12.0–15.0)
MCH: 32.8 pg (ref 26.0–34.0)
MCHC: 33.9 g/dL (ref 30.0–36.0)
MCV: 96.8 fL (ref 80.0–100.0)
Platelets: 217 K/uL (ref 150–400)
RBC: 4.05 MIL/uL (ref 3.87–5.11)
RDW: 12.7 % (ref 11.5–15.5)
WBC: 7 K/uL (ref 4.0–10.5)
nRBC: 0 % (ref 0.0–0.2)

## 2024-06-06 LAB — TROPONIN T, HIGH SENSITIVITY: Troponin T High Sensitivity: <15 ng/L (ref 0–19)

## 2024-06-06 LAB — BASIC METABOLIC PANEL WITH GFR
Anion gap: 10 (ref 5–15)
BUN: 14 mg/dL (ref 8–23)
CO2: 28 mmol/L (ref 22–32)
Calcium: 9.3 mg/dL (ref 8.9–10.3)
Chloride: 99 mmol/L (ref 98–111)
Creatinine, Ser: 0.75 mg/dL (ref 0.44–1.00)
GFR, Estimated: >60 mL/min (ref >60)
Glucose, Bld: 87 mg/dL (ref 70–99)
Potassium: 3.4 mmol/L — ABNORMAL LOW (ref 3.5–5.1)
Sodium: 137 mmol/L (ref 135–145)

## 2024-06-06 LAB — PRO BRAIN NATRIURETIC PEPTIDE: Pro Brain Natriuretic Peptide: 464 pg/mL — ABNORMAL HIGH (ref <300.0)

## 2024-06-06 NOTE — ED Provider Notes (Signed)
 [place] EMERGENCY DEPARTMENT AT [facility] Provider Note CSN: [id number] Arrival date & time: 06/06/24  2148   Patient presents with: Leg Swelling  Jasmine Rhodes is a 78 y.o. female with history of nonspecific chest pain and bilateral lower extremity edema x 3 days.  History of similar chest pain in the past per chart review.  Patient is in 92nd percentile for calcium  score on her CTA of the coronaries last year but has no history of ACS.  Chest pain is described as a vague pressure in the left chest without radiation, it is not exertional and has no identifiable alleviating factors.  Is not related to her eating habits.  No recent activities or traumas to the chest wall.  Some vague tingling down the left arm which has occurred for her in the past.  Bilateral extremity swelling without asymmetry per patient, no SOB or cough.  She is not anticoagulated.   HPI     Prior to Admission medications   Medication Sig Start Date End Date Taking? Authorizing Provider  furosemide (LASIX) 20 MG tablet Take 1 tablet (20 mg total) by mouth daily for 5 days. 06/07/24 06/12/24 Yes Rayfield Beem, Pleasant SAUNDERS, PA-C  acetaminophen (TYLENOL) 500 MG tablet Take 1,000 mg by mouth every 6 (six) hours as needed for moderate pain or headache.    [provider]  albuterol  (VENTOLIN  HFA) 108 (90 Base) MCG/ACT inhaler Inhale 2 puffs into the lungs every 6 (six) hours as needed for wheezing or shortness of breath. 04/29/24   Iva Marty Saltness, MD  Ascorbic Acid (VITAMIN C) 1000 MG tablet Take 1,000 mg by mouth daily.    [provider]  aspirin EC 81 MG tablet Take 81 mg by mouth daily.    [provider]  atenolol (TENORMIN) 50 MG tablet Take 1 tablet by mouth daily.    [provider]  bismuth subsalicylate (PEPTO BISMOL) 262 MG/15ML suspension Take 30 mLs by mouth every 6 (six) hours as needed for diarrhea or loose stools.    [provider]   budesonide-glycopyrrolate -formoterol (BREZTRI) 160-9-4.8 MCG/ACT AERO inhaler Inhale 2 puffs into the lungs 2 (two) times daily.    [provider]  cetirizine (ZYRTEC) 10 MG tablet Take 10 mg by mouth daily as needed for allergies.     [provider]  cholecalciferol (VITAMIN D3) 25 MCG (1000 UNIT) tablet Take 1,000 Units by mouth daily.    [provider]  clobetasol cream (TEMOVATE) 0.05 % SMARTSIG:sparingly Topical Twice Daily 01/27/24   [provider]  diclofenac  sodium (VOLTAREN ) 1 % GEL Apply 2 g topically daily as needed (for knee pain).  03/19/18   [provider]  fluticasone (FLONASE) 50 MCG/ACT nasal spray Place into both nostrils daily.    [provider]  montelukast (SINGULAIR) 10 MG tablet Take 1 tablet (10 mg total) by mouth daily. 05/13/24   Iva Marty Saltness, MD  Multiple Vitamin (MULTIVITAMIN WITH MINERALS) TABS tablet Take 1 tablet by mouth daily.    [provider]  omeprazole  (PRILOSEC) 40 MG capsule Take 1 capsule (40 mg total) by mouth 2 (two) times daily before a meal. Take twice daily for 4 weeks and then reduce to once daily. 06/06/23   Biever Charmaine CROME, NP  potassium chloride (MICRO-K) 10 MEQ CR capsule Take 10 mEq by mouth daily. 05/11/22   [provider]  RESTASIS 0.05 % ophthalmic emulsion 1 drop 2 (two) times daily. 04/04/23   [provider]  rosuvastatin  (CRESTOR ) 20 MG tablet TAKE 1 TABLET(20 MG) BY MOUTH DAILY 03/06/24   Croitoru, Mihai, MD  triamcinolone  cream (KENALOG ) 0.1 % Apply 1 Application topically 2 (two) times daily. 05/11/22   Leath-Warren, Etta PARAS, NP  triamterene-hydrochlorothiazide (DYAZIDE) 37.5-25 MG capsule Take 1 capsule by mouth daily.    [provider]    Allergies: Sulfa antibiotics    Review of Systems  Cardiovascular:  Positive for chest pain, palpitations and leg swelling.    Updated Vital Signs BP 100/81   Pulse 70   Temp 98.1 F (36.7  C) (Oral)   Resp 16   Ht 5' 3 (1.6 m)   Wt 65.3 kg   SpO2 100%   BMI 25.51 kg/m   Physical Exam Vitals and nursing note reviewed.  Constitutional:      Appearance: She is not ill-appearing or toxic-appearing.  HENT:     Head: Normocephalic and atraumatic.     Mouth/Throat:     Mouth: Mucous membranes are moist.     Pharynx: No oropharyngeal exudate or posterior oropharyngeal erythema.  Eyes:     General:        Right eye: No discharge.        Left eye: No discharge.     Extraocular Movements: Extraocular movements intact.     Conjunctiva/sclera: Conjunctivae normal.     Pupils: Pupils are equal, round, and reactive to light.  Cardiovascular:     Rate and Rhythm: Normal rate and regular rhythm.     Pulses: Normal pulses.     Heart sounds: Normal heart sounds. No murmur heard. Pulmonary:     Effort: Pulmonary effort is normal. No respiratory distress.     Breath sounds: Normal breath sounds. No wheezing or rales.  Abdominal:     General: Bowel sounds are normal. There is no distension.     Palpations: Abdomen is soft.     Tenderness: There is no abdominal tenderness. There is no guarding or rebound.  Musculoskeletal:        General: No deformity.     Cervical back: Neck supple.     Right lower leg: 1+ Pitting Edema present.     Left lower leg: 1+ Pitting Edema present.  Skin:    General: Skin is warm and dry.     Capillary Refill: Capillary refill takes less than 2 seconds.  Neurological:     General: No focal deficit present.     Mental Status: She is alert and oriented to person, place, and time. Mental status is at baseline.  Psychiatric:        Mood and Affect: Mood normal.     (all labs ordered are listed, but only abnormal results are displayed) Labs Reviewed  BASIC METABOLIC PANEL WITH GFR - Abnormal; Notable for the following components:      Result Value   Potassium 3.4 (*)    All other components within normal limits  PRO BRAIN NATRIURETIC PEPTIDE -  Abnormal; Notable for the following components:   Pro Brain Natriuretic Peptide 464.0 (*)    All other components within normal limits  CBC  HEPATIC FUNCTION PANEL  TROPONIN T, HIGH SENSITIVITY  TROPONIN T, HIGH SENSITIVITY    EKG: EKG Interpretation Date/Time:  Saturday June 06 2024 21:57:06 EST Ventricular Rate:  78 PR Interval:  183 QRS Duration:  83 QT Interval:  387 QTC Calculation: 441 R Axis:   48  Text Interpretation: Sinus rhythm Probable left atrial enlargement Confirmed  by Jerral Meth [phone]) on 06/07/2024 1:11:47 AM  Radiology: [facility] Chest 2 View Result Date: 06/06/2024 EXAM: 2 VIEW(S) XRAY OF THE CHEST 06/06/2024 10:17:00 PM COMPARISON: Comparison 10/08/2018. CLINICAL HISTORY: cp FINDINGS: LUNGS AND PLEURA: No focal pulmonary opacity. No pleural effusion. No pneumothorax. HEART AND MEDIASTINUM: No acute abnormality of the cardiac and mediastinal silhouettes. BONES AND SOFT TISSUES: Moderate degenerative changes of thoracic spine. IMPRESSION: 1. No acute cardiopulmonary process. Electronically signed by: Oneil Devonshire MD 06/06/2024 10:21 PM EST RP Workstation: [id number]     Procedures   Medications Ordered in the ED - No data to display  Clinical Course as of 06/07/24 0648  [facility] Jun 07, 2024  0111 Mild CHF exacerbation [CC]    Clinical Course User Index [CC] Jerral Meth, MD                                 Medical Decision Making 78 y/o with concern for chest pain and lower extremity swelling.  Hypertensive on today, but is otherwise normal.  Troponin abdominal signs are benign.  Bilateral lower extremity 1+ pitting with 2+ DP pulses bilaterally.  Patient without calf tenderness palpation, erythema, or asymmetry to the lower extremities.  DDx includes not limited to ACS, PE, pleural effusion, pneumonia, dysrhythmia, CHF.  Amount and/or Complexity of Data Reviewed Labs: ordered.    Details: CBC without leukocytosis, BMP with mild hypokalemia 3.4,  hepatic function is normal, BNP mildly elevated to 464, troponin is negative x 2. Radiology: ordered.    Details: Chest x-ray is unremarkable,  Risk Prescription drug management.   Clinical picture most consistent with possible mild CHF without evidence of respiratory compromise.  Will initiate low-dose diuretic for short course and recommend close outpatient follow-up with her established cardiologist Dr. Francyne.  While patient does have notable risk factors for cardiac etiology of her pain her described pain and presentation today are inconsistent with cardiac etiology.  Clinical concern for emergent underlying condition that would warrant further ED workup and patient management is exceedingly low.  Jasmine Rhodes and her daughter  voiced understanding of her medical evaluation and treatment plan. Each of their questions answered to their expressed satisfaction.  Return precautions were given.  Patient is well-appearing, stable, and was discharged in good condition.  This chart was dictated using voice recognition software, Dragon. Despite the best efforts of this provider to proofread and correct errors, errors may still occur which can change documentation meaning.      Final diagnoses:  Peripheral edema  Chest pain, unspecified type    ED Discharge Orders          Ordered    furosemide (LASIX) 20 MG tablet  Daily        06/07/24 0141    Ambulatory referral to Cardiology       Comments: If you have not heard from the Cardiology office within the next 72 hours please call [phone].   06/07/24 0141               Zineb Glade, Pleasant SAUNDERS, PA-C 06/07/24 9351    Tegeler, Lonni PARAS, MD 06/07/24 1456

## 2024-06-06 NOTE — ED Triage Notes (Signed)
 Pt POV d/t swelling in bilat Lower extremeties for 3 days.  Pt also has noticed some left tingling pain in chest that radiates to left arm for 1 day.

## 2024-06-07 LAB — HEPATIC FUNCTION PANEL
ALT: 31 U/L (ref 0–44)
AST: 41 U/L (ref 15–41)
Albumin: 4.2 g/dL (ref 3.5–5.0)
Alkaline Phosphatase: 79 U/L (ref 38–126)
Bilirubin, Direct: 0.2 mg/dL (ref 0.0–0.2)
Indirect Bilirubin: 0.4 mg/dL (ref 0.3–0.9)
Total Bilirubin: 0.6 mg/dL (ref 0.0–1.2)
Total Protein: 7.2 g/dL (ref 6.5–8.1)

## 2024-06-07 LAB — TROPONIN T, HIGH SENSITIVITY: Troponin T High Sensitivity: <15 ng/L (ref 0–19)

## 2024-06-07 MED ORDER — FUROSEMIDE 20 MG PO TABS: 20.0000 mg | ORAL_TABLET | Freq: Every day | ORAL | 0 refills | Status: DC

## 2024-06-07 NOTE — Discharge Instructions (Signed)
 You are seen in the ER today for your chest pain and leg swelling.  Your workup in the ER was largely reassuring.  Please follow-up close with your cardiologist for further evaluation of your chest pain and leg swelling.  You may use the prescribed fluid pills for the next few days to assist with relief of your lower extremity swelling symptoms.  Return to the ER with any severe symptoms.

## 2024-06-19 ENCOUNTER — Ambulatory Visit: Attending: Nurse Practitioner | Admitting: Nurse Practitioner

## 2024-06-19 ENCOUNTER — Encounter: Payer: Self-pay | Admitting: Nurse Practitioner

## 2024-06-19 VITALS — BP 126/80 | HR 74 | Ht 64.0 in | Wt 144.8 lb

## 2024-06-19 DIAGNOSIS — I10 Essential (primary) hypertension: Secondary | ICD-10-CM

## 2024-06-19 DIAGNOSIS — E785 Hyperlipidemia, unspecified: Secondary | ICD-10-CM | POA: Diagnosis not present

## 2024-06-19 DIAGNOSIS — R600 Localized edema: Secondary | ICD-10-CM

## 2024-06-19 DIAGNOSIS — I2510 Atherosclerotic heart disease of native coronary artery without angina pectoris: Secondary | ICD-10-CM

## 2024-06-19 DIAGNOSIS — Z720 Tobacco use: Secondary | ICD-10-CM

## 2024-06-19 MED ORDER — OLMESARTAN MEDOXOMIL 20 MG PO TABS: 20.0000 mg | ORAL_TABLET | Freq: Every day | ORAL | 3 refills | Status: AC

## 2024-06-19 MED ORDER — FUROSEMIDE 20 MG PO TABS: ORAL_TABLET | ORAL | 3 refills | Status: AC

## 2024-06-19 NOTE — Patient Instructions (Signed)
 Medication Instructions:  Stop Triamterene- Hydrochlorothiazide 37.5-25 mg Start Olmesartan  20 mg daily Resume Lasix  20 mg daily *If you need a refill on your cardiac medications before your next appointment, please call your pharmacy*  Lab Work: BMET Today  BMET in 2 weeks  If you have labs (blood work) drawn today and your tests are completely normal, you will receive your results only by: MyChart Message (if you have MyChart) OR A paper copy in the mail If you have any lab test that is abnormal or we need to change your treatment, we will call you to review the results.  Testing/Procedures: Your physician has requested that you have an echocardiogram. Echocardiography is a painless test that uses sound waves to create images of your heart. It provides your doctor with information about the size and shape of your heart and how well your heart's chambers and valves are working. This procedure takes approximately one hour. There are no restrictions for this procedure. Please do NOT wear cologne, perfume, aftershave, or lotions (deodorant is allowed). Please arrive 15 minutes prior to your appointment time.  Please note: We ask at that you not bring children with you during ultrasound (echo/ vascular) testing. Due to room size and safety concerns, children are not allowed in the ultrasound rooms during exams. Our front office staff cannot provide observation of children in our lobby area while testing is being conducted. An adult accompanying a patient to their appointment will only be allowed in the ultrasound room at the discretion of the ultrasound technician under special circumstances. We apologize for any inconvenience.   Follow-Up: At [facility], you and your health needs are our priority.  As part of our continuing mission to provide you with exceptional heart care, our providers are all part of one team.  This team includes your primary Cardiologist (physician) and Advanced  Practice Providers or APPs (Physician Assistants and Nurse Practitioners) who all work together to provide you with the care you need, when you need it.  Your next appointment:   6 week(s)  Provider:   Dr. Francyne or Damien Braver, NP          We recommend signing up for the patient portal called MyChart.  Sign up information is provided on this After Visit Summary.  MyChart is used to connect with patients for Virtual Visits (Telemedicine).  Patients are able to view lab/test results, encounter notes, upcoming appointments, etc.  Non-urgent messages can be sent to your provider as well.   To learn more about what you can do with MyChart, go to [email or web address].

## 2024-06-19 NOTE — Progress Notes (Unsigned)
 Office Visit    Patient Name: Jasmine Rhodes Date of Encounter: 06/19/2024  Primary Care Provider:  Shona Norleen PEDLAR, MD Primary Cardiologist:  None  Chief Complaint   78 year old female with a history of moderate nonobstructive CAD, hypertension, hyperlipidemia, and tobacco use who presents for follow-up related to CAD and lower extremity edema.  Past Medical History    Past Medical History:  Diagnosis Date   Asthma    DDD (degenerative disc disease), lumbar    Hyperlipidemia    Hypertension    Osteoarthritis    Sleep apnea    Past Surgical History:  Procedure Laterality Date   BALLOON DILATION N/A 05/10/2020   Procedure: BALLOON DILATION;  Surgeon: Cindie Carlin POUR, DO;  [facility];  Service: Endoscopy;  Laterality: N/A;   BIOPSY  05/10/2020   Procedure: BIOPSY;  Surgeon: Cindie Carlin POUR, DO;  [facility];  Service: Endoscopy;;   BREAST BIOPSY Right    BUNIONECTOMY Bilateral 2015   CARPAL TUNNEL RELEASE Right 03/30/2017   COLONOSCOPY     COLONOSCOPY N/A 04/25/2017   Procedure: COLONOSCOPY;  Surgeon: Harvey Margo CROME, MD;  [facility]: [facility];  Service: Endoscopy;  Laterality: N/A;  1200   COLONOSCOPY N/A 04/14/2024   Procedure: COLONOSCOPY;  Surgeon: Cindie Carlin POUR, DO;  [facility];  Service: Endoscopy;  Laterality: N/A;  930AM, OK RM 1-2   DG THUMB LEFT HAND     ESOPHAGOGASTRODUODENOSCOPY N/A 04/25/2017   Procedure: ESOPHAGOGASTRODUODENOSCOPY (EGD);  Surgeon: Harvey Margo CROME, MD;  [facility];  Service: Endoscopy;  Laterality: N/A;   ESOPHAGOGASTRODUODENOSCOPY (EGD) WITH PROPOFOL  N/A 05/10/2020   Procedure: ESOPHAGOGASTRODUODENOSCOPY (EGD) WITH PROPOFOL ;  Surgeon: Cindie Carlin POUR, DO;  [facility];  Service: Endoscopy;  Laterality: N/A;  2:00pm   SAVORY DILATION N/A 04/25/2017   Procedure: SAVORY DILATION;  Surgeon: Harvey Margo CROME, MD;  [facility]: [facility];  Service: Endoscopy;   Laterality: N/A;   TONSILLECTOMY N/A 1953   tonsils removal      Allergies  Allergies  Allergen Reactions   Sulfa Antibiotics Rash     Labs/Other Studies Reviewed    The following studies were reviewed today:  Cardiac Studies & Procedures   ______________________________________________________________________________________________   STRESS TESTS  EXERCISE TOLERANCE TEST (ETT) 01/16/2017  Interpretation Summary  Blood pressure demonstrated a normal response to exercise.  There was no ST segment deviation noted during stress.  Negative exercise stress test for ischemia. Duke treadmill score is 6, consistent with low risk for major cardiac events        CT SCANS  CT CORONARY MORPH W/CTA COR W/SCORE 08/16/2022  Addendum 08/16/2022  2:53 PM ADDENDUM REPORT: 08/16/2022 14:50  EXAM: OVER-READ INTERPRETATION  CT CHEST  The following report is an over-read performed by radiologist Dr. Tanda Lyons of [facility] on 08/16/2022. This over-read does not include interpretation of cardiac or coronary anatomy or pathology. The coronary calcium  score interpretation by the cardiologist is attached.  COMPARISON:  CT chest without contrast 07/11/2022  FINDINGS: Cardiovascular: There are no significant extracardiac vascular findings.  Mediastinum/Nodes: There are no enlarged lymph nodes within the visualized mediastinum.  Lungs/Pleura: There is no pleural effusion. The visualized lungs appear clear.  Upper abdomen: No significant findings in the visualized upper abdomen.  Musculoskeletal/Chest wall: Moderate to severe multilevel disc space narrowing and anterior bridging osteophytes of the visualized lower thoracic spine.  IMPRESSION: No significant extracardiac findings within the visualized chest.  Electronically Signed By: Tanda Lyons M.D. On: 08/16/2022 14:50  Narrative HISTORY: Chest pain, nonspecific  EXAM: Cardiac/Coronary   CT  TECHNIQUE: The patient was scanned on a [employer].  PROTOCOL: A 120 kV prospective scan was triggered in the descending thoracic aorta at 111 HU's. Axial non-contrast 3 mm slices were carried out through the heart. The data set was analyzed on a dedicated work station and scored using the Agatston method. Gantry rotation speed was 250 msecs and collimation was .6 mm. Beta blockade and 0.8 mg of sl NTG was given. The 3D data set was reconstructed in 5% intervals of the 35-75 % of the R-R cycle. Systolic and diastolic phases were analyzed on a dedicated work station using MPR, MIP and VRT modes. The patient received contrast: 100mL OMNIPAQUE  IOHEXOL  350 MG/ML SOLN.  FINDINGS: Image quality: Good  Noise artifact is: Mild misregistration  Coronary calcium  score is 552, which places the patient in the 92nd percentile for age and sex matched control.  Coronary arteries: Normal coronary origins.  Right dominance.  Right Coronary Artery: Minimal mixed atherosclerotic plaque in the proximal RCA, <25% stenosis. Moderate diffuse atherosclerotic plaque in the mid RCA, 50-69% stenosis. Mild mixed atherosclerotic plaque in the mid-distal RCA, 25-49% stenosis. Patent RPDA, RPLA.  Left Main Coronary Artery: No detectable plaque or stenosis.  Left Anterior Descending Coronary Artery: Minimal mixed atherosclerotic plaque in the proximal LAD, <25% stenosis. Mild mixed atherosclerotic plaque in the ostial and proximal first diagonal, 25-49% stenosis.  Left Circumflex Artery: Minimal scattered mixed atherosclerotic plaque in the proximal LCx, <25% stenosis.  Aorta: Normal size, 28 mm at the mid ascending aorta (level of the PA bifurcation) measured double oblique.  Aortic Valve: No calcifications. Mildly thickened aortic valve, tricuspid.  Mitral valve: Moderately thickened mitral valve.  Other findings:  Normal pulmonary vein drainage into the left atrium.  Normal  left atrial appendage without thrombus.  Normal size of the pulmonary artery.  Please see separate report from [facility] for non-cardiac findings.  IMPRESSION: 1. Moderate CAD in the mid RCA, 50-69% stenosis, CADRADS 3. CT FFR will be performed and reported separately.  2. Coronary calcium  score is 552, which places the patient in the 92nd percentile for age and sex matched control.  3. Normal coronary origins with right dominance.  RECOMMENDATIONS: CAD-RADS 3. Moderate stenosis. Consider symptom-guided anti-ischemic pharmacotherapy as well as risk factor modification per guideline directed care. Additional analysis with CT FFR will be submitted.  Electronically Signed: By: Soyla Merck M.D. On: 08/16/2022 13:55     ______________________________________________________________________________________________     Recent Labs: 06/06/2024: ALT 31; BUN 14; Creatinine, Ser 0.75; Hemoglobin 13.3; Platelets 217; Potassium 3.4; Pro Brain Natriuretic Peptide 464.0; Sodium 137  Recent Lipid Panel    Component Value Date/Time   CHOL 170 11/30/2022 0851   TRIG 82 11/30/2022 0851   HDL 100 11/30/2022 0851   CHOLHDL 1.7 11/30/2022 0851   LDLCALC 55 11/30/2022 0851    History of Present Illness    78 year old female with a history of moderate nonobstructive CAD, hypertension, hyperlipidemia, and tobacco use who presents for follow-up related to CAD and lower extremity edema.  Previously seen by Dr. Delford. ETT in 2018 was normal. She later established with Dr. Francyne in the setting of chest pain.  She was last seen in the office on 10/17/2022 and reported chest pain at rest, overall atypical.  Coronary CT angiogram in 07/2022 revealed coronary calcium  score of 552 (92nd percentile), moderate nonobstructive  CAD, negative FFR.  She presented to the ED on 06/06/2024 in the setting of increased bilateral lower extremity edema, chest discomfort radiating to her left arm. Chest  x-ray was unremarkable, BNP was mildly elevated, troponin was negative x 2.  She was started on Lasix  and advised to follow-up with cardiology as an outpatient.  She presents today for follow-up.  Since her last visit and recent ED visit She accompanied by her daughter.  Stable overall from a cardiac standpoint.  She noticed improvement in her lower extremity swelling with Lasix .  Unfortunately, her course was for only 5 days.  She does have a history of hypokalemia, she has been on triamterene-HCTZ for many years.  Will check BMET today.  Through shared decision making, will stop triamterene HCTZ.  Will start olmesartan  20 mg daily.  Will resume Lasix  20 mg daily.  Will repeat BMET in 2 weeks.  Continue to monitor BP, symptoms.  Denies any further chest pain, dyspnea, PND, orthopnea, weight gain.  Trace nonpitting bilateral lower extremity edema.  She has had left arm pain improved with stretching and movement this has been constant for months.  Suspect musculoskeletal source.  Continue to monitor for progressive symptoms.  Low threshold to consider stress test.  Will defer for now.  Will update echocardiogram.  Follow-up in 6 weeks. 1. CAD: Coronary CT angiogram in 07/2022 revealed coronary calcium  score of 552 (92nd percentile), moderate nonobstructive CAD, negative FFR.   2. Bilateral lower extremity edema: Improved with Lasix .  3. Hypertension: BP well controlled. Continue current antihypertensive regimen.  4. Hyperlipidemia: LDL was 59 in 12/2023.  Continue Crestor .  5. Tobacco use: She is no longer smoking. I congratulated her on this.  6. Disposition: Follow-up in  Home Medications    Current Outpatient Medications  Medication Sig Dispense Refill   acetaminophen (TYLENOL) 500 MG tablet Take 1,000 mg by mouth every 6 (six) hours as needed for moderate pain or headache.     albuterol  (VENTOLIN  HFA) 108 (90 Base) MCG/ACT inhaler Inhale 2 puffs into the lungs every 6 (six) hours as needed for  wheezing or shortness of breath. 1 each 3   Ascorbic Acid (VITAMIN C) 1000 MG tablet Take 1,000 mg by mouth daily.     aspirin EC 81 MG tablet Take 81 mg by mouth daily.     atenolol (TENORMIN) 50 MG tablet Take 1 tablet by mouth daily.     bismuth subsalicylate (PEPTO BISMOL) 262 MG/15ML suspension Take 30 mLs by mouth every 6 (six) hours as needed for diarrhea or loose stools.     budesonide-glycopyrrolate -formoterol (BREZTRI) 160-9-4.8 MCG/ACT AERO inhaler Inhale 2 puffs into the lungs 2 (two) times daily.     cetirizine (ZYRTEC) 10 MG tablet Take 10 mg by mouth daily as needed for allergies.      cholecalciferol (VITAMIN D3) 25 MCG (1000 UNIT) tablet Take 1,000 Units by mouth daily.     clobetasol cream (TEMOVATE) 0.05 % SMARTSIG:sparingly Topical Twice Daily     diclofenac  sodium (VOLTAREN ) 1 % GEL Apply 2 g topically daily as needed (for knee pain).   3   fluticasone (FLONASE) 50 MCG/ACT nasal spray Place into both nostrils daily.     furosemide  (LASIX ) 20 MG tablet Take 1 tablet (20 mg total) by mouth daily for 5 days. 5 tablet 0   montelukast  (SINGULAIR ) 10 MG tablet Take 1 tablet (10 mg total) by mouth daily. 90 tablet 1   Multiple Vitamin (MULTIVITAMIN WITH MINERALS) TABS tablet  Take 1 tablet by mouth daily.     omeprazole  (PRILOSEC) 40 MG capsule Take 1 capsule (40 mg total) by mouth 2 (two) times daily before a meal. Take twice daily for 4 weeks and then reduce to once daily. 90 capsule 3   potassium chloride (MICRO-K) 10 MEQ CR capsule Take 10 mEq by mouth daily.     RESTASIS 0.05 % ophthalmic emulsion 1 drop 2 (two) times daily.     rosuvastatin  (CRESTOR ) 20 MG tablet TAKE 1 TABLET(20 MG) BY MOUTH DAILY 90 tablet 3   triamcinolone  cream (KENALOG ) 0.1 % Apply 1 Application topically 2 (two) times daily. 30 g 0   triamterene-hydrochlorothiazide (DYAZIDE) 37.5-25 MG capsule Take 1 capsule by mouth daily.     No current facility-administered medications for this visit.     Review of  Systems    ***.  All other systems reviewed and are otherwise negative except as noted above.    Physical Exam    VS:  There were no vitals taken for this visit. , BMI There is no height or weight on file to calculate BMI.     GEN: Well nourished, well developed, in no acute distress. HEENT: normal. Neck: Supple, no JVD, carotid bruits, or masses. Cardiac: RRR, no murmurs, rubs, or gallops. No clubbing, cyanosis, edema.  Radials/DP/PT 2+ and equal bilaterally.  Respiratory:  Respirations regular and unlabored, clear to auscultation bilaterally. GI: Soft, nontender, nondistended, BS + x 4. MS: no deformity or atrophy. Skin: warm and dry, no rash. Neuro:  Strength and sensation are intact. Psych: Normal affect.  Accessory Clinical Findings    ECG personally reviewed by me today -    - no acute changes.   Lab Results  Component Value Date   WBC 7.0 06/06/2024   HGB 13.3 06/06/2024   HCT 39.2 06/06/2024   MCV 96.8 06/06/2024   PLT 217 06/06/2024   Lab Results  Component Value Date   CREATININE 0.75 06/06/2024   BUN 14 06/06/2024   NA 137 06/06/2024   K 3.4 (L) 06/06/2024   CL 99 06/06/2024   CO2 28 06/06/2024   Lab Results  Component Value Date   ALT 31 06/06/2024   AST 41 06/06/2024   ALKPHOS 79 06/06/2024   BILITOT 0.6 06/06/2024   Lab Results  Component Value Date   CHOL 170 11/30/2022   HDL 100 11/30/2022   LDLCALC 55 11/30/2022   TRIG 82 11/30/2022   CHOLHDL 1.7 11/30/2022    No results found for: HGBA1C  Assessment & Plan    1.  ***  No BP recorded.  {Refresh Note OR Click here to enter BP  :1}***   Damien JAYSON Braver, NP 06/19/2024, 12:23 PM

## 2024-06-20 LAB — BASIC METABOLIC PANEL WITH GFR
BUN/Creatinine Ratio: 22 (ref 12–28)
BUN: 15 mg/dL (ref 8–27)
CO2: 24 mmol/L (ref 20–29)
Calcium: 9.9 mg/dL (ref 8.7–10.3)
Chloride: 97 mmol/L (ref 96–106)
Creatinine, Ser: 0.69 mg/dL (ref 0.57–1.00)
Glucose: 78 mg/dL (ref 70–99)
Potassium: 3.9 mmol/L (ref 3.5–5.2)
Sodium: 142 mmol/L (ref 134–144)
eGFR: 89 mL/min/1.73 (ref >59)

## 2024-06-23 ENCOUNTER — Telehealth: Payer: Self-pay | Admitting: Cardiovascular Disease

## 2024-06-23 ENCOUNTER — Encounter: Payer: Self-pay | Admitting: Nurse Practitioner

## 2024-06-23 NOTE — Telephone Encounter (Signed)
 Spoke with pt. Let pt know we would call with results once reviewed by the provider.

## 2024-06-23 NOTE — Telephone Encounter (Signed)
 Patient called to follow up on her lab results.

## 2024-06-29 ENCOUNTER — Ambulatory Visit: Payer: Self-pay | Admitting: Nurse Practitioner

## 2024-07-01 ENCOUNTER — Other Ambulatory Visit (HOSPITAL_COMMUNITY): Payer: Self-pay | Admitting: Family Medicine

## 2024-07-01 DIAGNOSIS — R0602 Shortness of breath: Secondary | ICD-10-CM

## 2024-07-02 ENCOUNTER — Ambulatory Visit (HOSPITAL_COMMUNITY)
Admission: RE | Admit: 2024-07-02 | Discharge: 2024-07-02 | Disposition: A | Source: Ambulatory Visit | Attending: Family Medicine | Admitting: Family Medicine

## 2024-07-02 DIAGNOSIS — R0602 Shortness of breath: Secondary | ICD-10-CM | POA: Insufficient documentation

## 2024-07-04 LAB — BASIC METABOLIC PANEL WITH GFR
BUN/Creatinine Ratio: 12 (ref 12–28)
BUN: 10 mg/dL (ref 8–27)
CO2: 25 mmol/L (ref 20–29)
Calcium: 9.6 mg/dL (ref 8.7–10.3)
Chloride: 102 mmol/L (ref 96–106)
Creatinine, Ser: 0.83 mg/dL (ref 0.57–1.00)
Glucose: 86 mg/dL (ref 70–99)
Potassium: 3.8 mmol/L (ref 3.5–5.2)
Sodium: 142 mmol/L (ref 134–144)
eGFR: 73 mL/min/1.73 (ref >59)

## 2024-07-15 LAB — LAB REPORT - SCANNED
A1c: 5.3
Albumin, Urine POC: 3
Albumin/Creatinine Ratio, Urine, POC: 21
Creatinine, POC: 14.5 mg/dL
EGFR: 65

## 2024-07-31 ENCOUNTER — Ambulatory Visit (HOSPITAL_COMMUNITY)
Admission: RE | Admit: 2024-07-31 | Discharge: 2024-07-31 | Disposition: A | Discharge status: Home / Self Care | Source: Ambulatory Visit | Attending: Nurse Practitioner | Admitting: Nurse Practitioner

## 2024-07-31 DIAGNOSIS — I10 Essential (primary) hypertension: Secondary | ICD-10-CM | POA: Diagnosis present

## 2024-07-31 DIAGNOSIS — I2510 Atherosclerotic heart disease of native coronary artery without angina pectoris: Secondary | ICD-10-CM

## 2024-07-31 DIAGNOSIS — R600 Localized edema: Secondary | ICD-10-CM

## 2024-07-31 LAB — ECHOCARDIOGRAM COMPLETE
Area-P 1/2: 4.79 cm2
S' Lateral: 2.4 cm

## 2024-08-05 NOTE — Telephone Encounter (Signed)
 Pt saw results from Echo on Mychart but would like to speak to a nurse about them. Please advise.

## 2024-08-14 ENCOUNTER — Other Ambulatory Visit: Payer: Self-pay

## 2024-08-14 ENCOUNTER — Ambulatory Visit: Admitting: Family Medicine

## 2024-08-14 ENCOUNTER — Encounter: Payer: Self-pay | Admitting: Family Medicine

## 2024-08-14 VITALS — BP 118/70 | HR 66 | Temp 97.6°F | Resp 18 | Ht 63.0 in | Wt 153.2 lb

## 2024-08-14 DIAGNOSIS — J4540 Moderate persistent asthma, uncomplicated: Secondary | ICD-10-CM | POA: Diagnosis not present

## 2024-08-14 DIAGNOSIS — J3089 Other allergic rhinitis: Secondary | ICD-10-CM | POA: Diagnosis not present

## 2024-08-14 DIAGNOSIS — G4733 Obstructive sleep apnea (adult) (pediatric): Secondary | ICD-10-CM

## 2024-08-14 DIAGNOSIS — H1010 Acute atopic conjunctivitis, unspecified eye: Secondary | ICD-10-CM

## 2024-08-14 DIAGNOSIS — J302 Other seasonal allergic rhinitis: Secondary | ICD-10-CM | POA: Diagnosis not present

## 2024-08-14 MED ORDER — CROMOLYN SODIUM 4 % OP SOLN: 2.0000 [drp] | Freq: Four times a day (QID) | OPHTHALMIC | 5 refills | Status: AC | PRN

## 2024-08-14 NOTE — Progress Notes (Signed)
 "  [address] [place] [address] Dept: [phone]  FOLLOW UP NOTE  Patient ID: Jasmine Rhodes, female    DOB: 02/20/1946  Age: 79 y.o. MRN: [id number] Date of Office Visit: 08/14/2024  Assessment  Chief Complaint: Follow-up (Pt presents to the office for a 3 month follow up. No concerns or questions. )  HPI Jasmine Rhodes is a 79 year old female who presents to the clinic for follow-up visit.  She was last seen in this clinic on 05/13/2024 by Dr. Iva for evaluation of asthma, allergic rhinitis, and obstructive sleep apnea on CPAP.  Discussed the use of AI scribe software for clinical note transcription with the patient, who gave verbal consent to proceed.  History of Present Illness Jasmine Rhodes is a 79 year old female who presents for a check-in regarding her respiratory symptoms.  Her breathing has been stable with no extreme shortness of breath during activities such as walking. She has a dry cough that began about a month ago.  She continues to use her Breztri inhaler twice daily and montelukast  daily. Albuterol  is used infrequently due to the effectiveness of Breztri.  She experiences significant post-nasal drainage down the back of her throat, ongoing for about six months. This symptom is particularly bothersome at night, sometimes waking her up and requiring her to expectorate mucus. She does not believe this is seasonal, describing it as continuous.  Nasal symptoms include a runny and stuffy nose, and frequent sneezing. She occasionally uses Zyrtec in addition to montelukast  to manage these symptoms. Exposure to her daughter's cat exacerbates her sneezing. Her last environmental allergy  skin testing on 05/13/2024 was positive to grass pollen, tree pollen, weed pollen, ragweed pollen, mold, dust mite, and cat.  We discussed allergen immunotherapy in detail at today's visit covering topics including unusual timing of visits,  treatment duration, risks, and benefits.  Written information was provided at today's visit.  Allergic conjunctivitis is reported as not well-controlled with episodes including red and itchy eyes for which she continues She has not been using her CPAP machine due to needing a replacement part.  Her current medications are listed in the chart.  Drug Allergies:  Allergies[1]  Physical Exam: BP 118/70 (BP Location: Left Arm, Patient Position: Sitting, Cuff Size: Normal)   Pulse 66   Temp 97.6 F (36.4 C) (Temporal)   Resp 18   Ht 5' 3 (1.6 m)   Wt 153 lb 3.2 oz (69.5 kg)   SpO2 97%   BMI 27.14 kg/m    Physical Exam Vitals reviewed.  Constitutional:      Appearance: Normal appearance.  HENT:     Head: Normocephalic and atraumatic.     Right Ear: Tympanic membrane normal.     Left Ear: Tympanic membrane normal.     Nose:     Comments: Bilateral nares normal. Pharynx normal. Ears normal. Eyes normal.    Mouth/Throat:     Pharynx: Oropharynx is clear.  Eyes:     Conjunctiva/sclera: Conjunctivae normal.  Cardiovascular:     Rate and Rhythm: Normal rate and regular rhythm.     Heart sounds: Normal heart sounds. No murmur heard. Pulmonary:     Effort: Pulmonary effort is normal.     Breath sounds: Normal breath sounds.     Comments: Lungs clear to auscultation Musculoskeletal:        General: Normal range of motion.     Cervical back: Normal range of motion and neck supple.  Skin:  General: Skin is warm and dry.  Neurological:     Mental Status: She is alert and oriented to person, place, and time.  Psychiatric:        Mood and Affect: Mood normal.        Behavior: Behavior normal.        Thought Content: Thought content normal.        Judgment: Judgment normal.     Diagnostics: FVC 1.96 which is 90% of predicted value, FEV1 1.52 which is 91% of predicted value.  Spirometry indicates normal ventilatory function.  Assessment and Plan: 1. Seasonal and perennial  allergic rhinitis   2. Moderate persistent asthma, uncomplicated   3. Seasonal allergic conjunctivitis   4. OSA (obstructive sleep apnea)     Meds ordered this encounter  Medications   cromolyn  (OPTICROM ) 4 % ophthalmic solution    Sig: Place 2 drops into both eyes 4 (four) times daily as needed.    Dispense:  10 mL    Refill:  5    Patient Instructions  Asthma Continue Breztri 2 puffs twice a day with a spacer to prevent cough or wheeze Continue montelukast  10 mg once a day to prevent cough or wheeze Continue albuterol  2 puffs once every 4 hours if needed for cough or wheeze You may use albuterol  2 puffs 5-15 minutes before activity to decrease cough or wheeze   Allergic rhinitis Continue allergen avoidance measures directed toward grass pollen, weed pollen, ragweed pollen, tree pollen, mold, dust mite, and cat as listed below Continue montelukast  10 mg once a day for control of allergy  symptoms Continue Flonase 2 sprays in each nostril once a day if needed for stuffy nose Consider saline nasal rinses as needed for nasal symptoms. Use this before any medicated nasal sprays for best result Continue cetirizine 10 mg once a day if needed for runny nose or itch Consider allergen immunotherapy if your symptoms are not well-controlled with the treatment plan as listed above. Call the clinic if you are interested in beginning allergy  injections  Allergic conjunctivitis Begin cromolyn  eye drops 1-2 drops id each eye up to 4 times a day if needed for red or itchy eyes Continue a lubricating eye drpp Avoid drops that promise to get the red out  Obstructive sleep apnea Continue CPAP as recommended by your pulmonology specialist  Call the clinic if this treatment plan is not working well for you.  Follow up in 6 months or sooner if needed.   Return in about 6 months (around 02/11/2025), or if symptoms worsen or fail to improve.    Thank you for the opportunity to care for this  patient.  Please do not hesitate to contact me with questions.  Arlean Mutter, FNP [facility]           [1]  Allergies Allergen Reactions   Sulfa Antibiotics Rash   "

## 2024-08-14 NOTE — Patient Instructions (Addendum)
 Asthma Continue Breztri 2 puffs twice a day with a spacer to prevent cough or wheeze Continue montelukast  10 mg once a day to prevent cough or wheeze Continue albuterol  2 puffs once every 4 hours if needed for cough or wheeze You may use albuterol  2 puffs 5-15 minutes before activity to decrease cough or wheeze   Allergic rhinitis Continue allergen avoidance measures directed toward grass pollen, weed pollen, ragweed pollen, tree pollen, mold, dust mite, and cat as listed below Continue montelukast  10 mg once a day for control of allergy  symptoms Continue Flonase 2 sprays in each nostril once a day if needed for stuffy nose Consider saline nasal rinses as needed for nasal symptoms. Use this before any medicated nasal sprays for best result Continue cetirizine 10 mg once a day if needed for runny nose or itch Consider allergen immunotherapy if your symptoms are not well-controlled with the treatment plan as listed above. Call the clinic if you are interested in beginning allergy  injections  Allergic conjunctivitis Begin cromolyn  eye drops 1-2 drops id each eye up to 4 times a day if needed for red or itchy eyes Continue a lubricating eye drpp Avoid drops that promise to get the red out  Obstructive sleep apnea Continue CPAP as recommended by your pulmonology specialist  Call the clinic if this treatment plan is not working well for you.  Follow up in 6 months or sooner if needed.  Reducing Pollen Exposure The [employer] , [employer] suggests the following steps to reduce your exposure to pollen during allergy  seasons. Do not hang sheets or clothing out to dry; pollen may collect on these items. Do not mow lawns or spend time around freshly cut grass; mowing stirs up pollen. Keep windows closed at night.  Keep car windows closed while driving. Minimize morning activities outdoors, a time when pollen counts are usually at their highest. Stay indoors as much as  possible when pollen counts or humidity is high and on windy days when pollen tends to remain in the air longer. Use air conditioning when possible.  Many air conditioners have filters that trap the pollen spores. Use a HEPA room air filter to remove pollen form the indoor air you breathe.    Control of Mold Allergen Mold and fungi can grow on a variety of surfaces provided certain temperature and moisture conditions exist.  Outdoor molds grow on plants, decaying vegetation and soil.  The major outdoor mold, Alternaria and Cladosporium, are found in very high numbers during hot and dry conditions.  Generally, a late Summer - Fall peak is seen for common outdoor fungal spores.  Rain will temporarily lower outdoor mold spore count, but counts rise rapidly when the rainy period ends.  The most important indoor molds are Aspergillus and Penicillium.  Dark, humid and poorly ventilated basements are ideal sites for mold growth.  The next most common sites of mold growth are the bathroom and the kitchen.  Outdoor [employer] Use air conditioning and keep windows closed Avoid exposure to decaying vegetation. Avoid leaf raking. Avoid grain handling. Consider wearing a face mask if working in moldy areas.  Indoor Mold Control Maintain humidity below 50%. Clean washable surfaces with 5% bleach solution. Remove sources e.g. Contaminated carpets.  Control of Dust Mite Allergen Dust mites play a major role in allergic asthma and rhinitis. They occur in environments with high humidity wherever human skin is found. Dust mites absorb humidity from the atmosphere (ie, they do not  drink) and feed on organic matter (including shed human and animal skin). Dust mites are a microscopic type of insect that you cannot see with the naked eye. High levels of dust mites have been detected from mattresses, pillows, carpets, upholstered furniture, bed covers, clothes, soft toys and any woven material. The principal  allergen of the dust mite is found in its feces. A gram of dust may contain 1,000 mites and 250,000 fecal particles. Mite antigen is easily measured in the air during house cleaning activities. Dust mites do not bite and do not cause harm to humans, other than by triggering allergies/asthma.  Ways to decrease your exposure to dust mites in your home:  1. Encase mattresses, box springs and pillows with a mite-impermeable barrier or cover 2. Wash sheets, blankets and drapes weekly in hot water  (130 F) with detergent and dry them in a dryer on the hot setting. 3. Have the room cleaned frequently with a vacuum cleaner and a damp dust-mop. For carpeting or rugs, vacuuming with a vacuum cleaner equipped with a high-efficiency particulate air (HEPA) filter. The dust mite allergic individual should not be in a room which is being cleaned and should wait 1 hour after cleaning before going into the room. 4. Do not sleep on upholstered furniture (eg, couches). 5. If possible removing carpeting, upholstered furniture and drapery from the home is ideal. Horizontal blinds should be eliminated in the rooms where the person spends the most time (bedroom, study, television room). Washable vinyl, roller-type shades are optimal. 6. Remove all non-washable stuffed toys from the bedroom. Wash stuffed toys weekly like sheets and blankets above. 7. Reduce indoor humidity to less than 50%. Inexpensive humidity monitors can be purchased at most hardware stores. Do not use a humidifier as can make the problem worse and are not recommended.  Control of Dog or Cat Allergen Avoidance is the best way to manage a dog or cat allergy . If you have a dog or cat and are allergic to dog or cats, consider removing the dog or cat from the home. If you have a dog or cat but dont want to find it a new home, or if your family wants a pet even though someone in the household is allergic, here are some strategies that may help keep symptoms at  bay:  Keep the pet out of your bedroom and restrict it to only a few rooms. Be advised that keeping the dog or cat in only one room will not limit the allergens to that room. Dont pet, hug or kiss the dog or cat; if you do, wash your hands with soap and water . High-efficiency particulate air (HEPA) cleaners run continuously in a bedroom or living room can reduce allergen levels over time. Regular use of a high-efficiency vacuum cleaner or a central vacuum can reduce allergen levels. Giving your dog or cat a bath at least once a week can reduce airborne allergen.

## 2024-08-19 ENCOUNTER — Encounter: Payer: Self-pay | Admitting: Nurse Practitioner

## 2024-08-19 ENCOUNTER — Ambulatory Visit: Attending: Nurse Practitioner | Admitting: Nurse Practitioner

## 2024-08-19 VITALS — BP 112/64 | HR 70 | Ht 63.0 in | Wt 145.0 lb

## 2024-08-19 DIAGNOSIS — I10 Essential (primary) hypertension: Secondary | ICD-10-CM | POA: Diagnosis not present

## 2024-08-19 DIAGNOSIS — R600 Localized edema: Secondary | ICD-10-CM

## 2024-08-19 DIAGNOSIS — I2510 Atherosclerotic heart disease of native coronary artery without angina pectoris: Secondary | ICD-10-CM | POA: Diagnosis not present

## 2024-08-19 DIAGNOSIS — E785 Hyperlipidemia, unspecified: Secondary | ICD-10-CM

## 2024-08-19 DIAGNOSIS — I340 Nonrheumatic mitral (valve) insufficiency: Secondary | ICD-10-CM | POA: Diagnosis not present

## 2024-08-19 LAB — LAB REPORT - SCANNED: EGFR: 76

## 2024-08-19 NOTE — Patient Instructions (Signed)
 Medication Instructions:  Your physician recommends that you continue on your current medications as directed. Please refer to the Current Medication list given to you today.  *If you need a refill on your cardiac medications before your next appointment, please call your pharmacy*  Lab Work: NONE ordered at this time of appointment   Testing/Procedures: NONE ordered at this time of appointment   Follow-Up: At [facility], you and your health needs are our priority.  As part of our continuing mission to provide you with exceptional heart care, our providers are all part of one team.  This team includes your primary Cardiologist (physician) and Advanced Practice Providers or APPs (Physician Assistants and Nurse Practitioners) who all work together to provide you with the care you need, when you need it.  Your next appointment:   3 month(s)  Provider:   Jerel Balding, MD or Damien Braver, NP          We recommend signing up for the patient portal called MyChart.  Sign up information is provided on this After Visit Summary.  MyChart is used to connect with patients for Virtual Visits (Telemedicine).  Patients are able to view lab/test results, encounter notes, upcoming appointments, etc.  Non-urgent messages can be sent to your provider as well.   To learn more about what you can do with MyChart, go to [email or web address].   Other Instructions Monitor Blood Pressure. Report systolic BP (top number consistently less than 100). Omron BP cuff recommended.

## 2024-08-19 NOTE — Progress Notes (Signed)
 "  Office Visit    Patient Name: Jasmine Rhodes Date of Encounter: 08/19/2024  Primary Care Provider:  Shona Norleen PEDLAR, MD Primary Cardiologist:  Jerel Balding, MD  Chief Complaint    79 year old female with a history of moderate nonobstructive CAD, mitral valve regurgitation, hypertension, hyperlipidemia, and former tobacco use who presents for follow-up related to CAD and lower extremity edema.   Past Medical History    Past Medical History:  Diagnosis Date   Asthma    DDD (degenerative disc disease), lumbar    Hyperlipidemia    Hypertension    Osteoarthritis    Sleep apnea    Past Surgical History:  Procedure Laterality Date   BALLOON DILATION N/A 05/10/2020   Procedure: BALLOON DILATION;  Surgeon: Cindie Carlin POUR, DO;  [facility];  Service: Endoscopy;  Laterality: N/A;   BIOPSY  05/10/2020   Procedure: BIOPSY;  Surgeon: Cindie Carlin POUR, DO;  [facility]: [facility];  Service: Endoscopy;;   BREAST BIOPSY Right    BUNIONECTOMY Bilateral 2015   CARPAL TUNNEL RELEASE Right 03/30/2017   COLONOSCOPY     COLONOSCOPY N/A 04/25/2017   Procedure: COLONOSCOPY;  Surgeon: Harvey Margo CROME, MD;  Location: [facility];  Service: Endoscopy;  Laterality: N/A;  1200   COLONOSCOPY N/A 04/14/2024   Procedure: COLONOSCOPY;  Surgeon: Cindie Carlin POUR, DO;  Location: [facility];  Service: Endoscopy;  Laterality: N/A;  930AM, OK RM 1-2   DG THUMB LEFT HAND     ESOPHAGOGASTRODUODENOSCOPY N/A 04/25/2017   Procedure: ESOPHAGOGASTRODUODENOSCOPY (EGD);  Surgeon: Harvey Margo CROME, MD;  [facility]: [facility];  Service: Endoscopy;  Laterality: N/A;   ESOPHAGOGASTRODUODENOSCOPY (EGD) WITH PROPOFOL  N/A 05/10/2020   Procedure: ESOPHAGOGASTRODUODENOSCOPY (EGD) WITH PROPOFOL ;  Surgeon: Cindie Carlin POUR, DO;  Location: [facility];  Service: Endoscopy;  Laterality: N/A;  2:00pm   SAVORY DILATION N/A 04/25/2017   Procedure: SAVORY DILATION;  Surgeon: Harvey Margo CROME, MD;   Location: [facility];  Service: Endoscopy;  Laterality: N/A;   TONSILLECTOMY N/A 1953   tonsils removal      Allergies  Allergies[1]   Labs/Other Studies Reviewed    The following studies were reviewed today:  Cardiac Studies & Procedures   ______________________________________________________________________________________________   STRESS TESTS  EXERCISE TOLERANCE TEST (ETT) 01/16/2017  Interpretation Summary  Blood pressure demonstrated a normal response to exercise.  There was no ST segment deviation noted during stress.  Negative exercise stress test for ischemia. Duke treadmill score is 6, consistent with low risk for major cardiac events   ECHOCARDIOGRAM  ECHOCARDIOGRAM COMPLETE 07/31/2024  Narrative ECHOCARDIOGRAM REPORT    Patient Name:   Jasmine Rhodes Date of Exam: 07/31/2024 Medical Rec #:  [id number]              Height:       64.0 in Accession #:    [id number]             Weight:       144.8 lb Date of Birth:  25-Aug-1945              BSA:          1.705 m Patient Age:    78 years               BP:           126/80 mmHg Patient Gender: F  HR:           69 bpm. Exam Location:  [address]  Procedure: 2D Echo, 3D Echo, Cardiac Doppler and Color Doppler (Both Spectral and Color Flow Doppler were utilized during procedure).  Indications:    I25.10 CAD  History:        Patient has no prior history of Echocardiogram examinations. CAD, Signs/Symptoms:Edema and Hypotension; Risk Factors:Sleep Apnea, Current Smoker, Hypertension and Dyslipidemia.  Sonographer:    Heather Hawks RDCS Referring Phys: Tyrie Porzio C Emmaus Brandi  IMPRESSIONS   1. Left ventricular ejection fraction, by estimation, is 60 to 65%. Left ventricular ejection fraction by 3D volume is 65 %. The left ventricle has normal function. The left ventricle has no regional wall motion abnormalities. Left ventricular diastolic parameters were normal. The E/e' is 7. 2.  Right ventricular systolic function is normal. The right ventricular size is mildly enlarged. There is normal pulmonary artery systolic pressure. 3. Left atrial size was mildly dilated. 4. Right atrial size was mildly dilated. 5. The mitral valve is normal in structure. Mild mitral valve regurgitation. No evidence of mitral stenosis. 6. Tricuspid valve regurgitation is moderate. 7. The aortic valve is tricuspid. Aortic valve regurgitation is trivial. No aortic stenosis is present. 8. The inferior vena cava is normal in size with greater than 50% respiratory variability, suggesting right atrial pressure of 3 mmHg.  Comparison(s): No prior Echocardiogram.  FINDINGS Left Ventricle: Left ventricular ejection fraction, by estimation, is 60 to 65%. Left ventricular ejection fraction by 3D volume is 65 %. The left ventricle has normal function. The left ventricle has no regional wall motion abnormalities. The left ventricular internal cavity size was normal in size. There is no left ventricular hypertrophy. Left ventricular diastolic parameters were normal. The E/e' is 7.  Right Ventricle: The right ventricular size is mildly enlarged. No increase in right ventricular wall thickness. Right ventricular systolic function is normal. There is normal pulmonary artery systolic pressure. The tricuspid regurgitant velocity is 2.38 m/s, and with an assumed right atrial pressure of 3 mmHg, the estimated right ventricular systolic pressure is 25.7 mmHg.  Left Atrium: Left atrial size was mildly dilated.  Right Atrium: Right atrial size was mildly dilated.  Pericardium: There is no evidence of pericardial effusion.  Mitral Valve: The mitral valve is normal in structure. Mild mitral valve regurgitation. No evidence of mitral valve stenosis.  Tricuspid Valve: The tricuspid valve is normal in structure. Tricuspid valve regurgitation is moderate . No evidence of tricuspid stenosis.  Aortic Valve: The aortic  valve is tricuspid. Aortic valve regurgitation is trivial. No aortic stenosis is present.  Pulmonic Valve: The pulmonic valve was normal in structure. Pulmonic valve regurgitation is mild. No evidence of pulmonic stenosis.  Aorta: The aortic root and ascending aorta are structurally normal, with no evidence of dilitation.  Venous: The inferior vena cava is normal in size with greater than 50% respiratory variability, suggesting right atrial pressure of 3 mmHg.  IAS/Shunts: No atrial level shunt detected by color flow Doppler.  Additional Comments: 3D was performed not requiring image post processing on an independent workstation and was normal.   LEFT VENTRICLE PLAX 2D LVIDd:         3.60 cm         Diastology LVIDs:         2.40 cm         LV e' medial:    11.00 cm/s LV PW:         0.70  cm         LV E/e' medial:  7.6 LV IVS:        0.90 cm         LV e' lateral:   12.55 cm/s LVOT diam:     2.10 cm         LV E/e' lateral: 6.7 LV SV:         78 LV SV Index:   46 LVOT Area:     3.46 cm        3D Volume EF LV IVRT:       74 msec         LV 3D EF:    Left ventricul ar ejection fraction by 3D volume is 65 %.  3D Volume EF: 3D EF:        65 % LV EDV:       101 ml LV ESV:       35 ml LV SV:        65 ml  RIGHT VENTRICLE             IVC RV Basal diam:  4.40 cm     IVC diam: 1.60 cm RV Mid diam:    3.00 cm RV S prime:     13.20 cm/s  PULMONARY VEINS TAPSE (M-mode): 2.9 cm      A Reversal Velocity: 34.60 cm/s RVSP:           25.7 mmHg   Diastolic Velocity:  49.70 cm/s S/D Velocity:        1.40 Systolic Velocity:   70.60 cm/s  LEFT ATRIUM           Index        RIGHT ATRIUM           Index LA diam:      3.20 cm 1.88 cm/m   RA Pressure: 3.00 mmHg LA Vol (A4C): 72.6 ml 42.57 ml/m  RA Area:     20.80 cm RA Volume:   65.20 ml  38.23 ml/m AORTIC VALVE LVOT Vmax:   104.50 cm/s LVOT Vmean:  65.400 cm/s LVOT VTI:    0.226 m  AORTA Ao Root diam: 2.90 cm Ao Asc diam:  3.00  cm  MITRAL VALVE               TRICUSPID VALVE MV Area (PHT): cm         TR Peak grad:   22.7 mmHg MV Decel Time: 159 msec    TR Vmax:        238.00 cm/s MV E velocity: 84.15 cm/s  Estimated RAP:  3.00 mmHg MV A velocity: 46.35 cm/s  RVSP:           25.7 mmHg MV E/A ratio:  1.82 SHUNTS Systemic VTI:  0.23 m Systemic Diam: 2.10 cm  Georganna Archer Electronically signed by Georganna Archer Signature Date/Time: 07/31/2024/2:14:54 PM    Final      CT SCANS  CT CORONARY MORPH W/CTA COR W/SCORE 08/16/2022  Addendum 08/16/2022  2:53 PM ADDENDUM REPORT: 08/16/2022 14:50  EXAM: OVER-READ INTERPRETATION  CT CHEST  The following report is an over-read performed by radiologist Dr. Tanda Lyons of [facility] on 08/16/2022. This over-read does not include interpretation of cardiac or coronary anatomy or pathology. The coronary calcium  score interpretation by the cardiologist is attached.  COMPARISON:  CT chest without contrast 07/11/2022  FINDINGS: Cardiovascular: There are no significant extracardiac vascular findings.  Mediastinum/Nodes: There are  no enlarged lymph nodes within the visualized mediastinum.  Lungs/Pleura: There is no pleural effusion. The visualized lungs appear clear.  Upper abdomen: No significant findings in the visualized upper abdomen.  Musculoskeletal/Chest wall: Moderate to severe multilevel disc space narrowing and anterior bridging osteophytes of the visualized lower thoracic spine.  IMPRESSION: No significant extracardiac findings within the visualized chest.   Electronically Signed By: Tanda Lyons M.D. On: 08/16/2022 14:50  Narrative HISTORY: Chest pain, nonspecific  EXAM: Cardiac/Coronary  CT  TECHNIQUE: The patient was scanned on a [employer].  PROTOCOL: A 120 kV prospective scan was triggered in the descending thoracic aorta at 111 HU's. Axial non-contrast 3 mm slices were carried out through the  heart. The data set was analyzed on a dedicated work station and scored using the Agatston method. Gantry rotation speed was 250 msecs and collimation was .6 mm. Beta blockade and 0.8 mg of sl NTG was given. The 3D data set was reconstructed in 5% intervals of the 35-75 % of the R-R cycle. Systolic and diastolic phases were analyzed on a dedicated work station using MPR, MIP and VRT modes. The patient received contrast: 100mL OMNIPAQUE  IOHEXOL  350 MG/ML SOLN.  FINDINGS: Image quality: Good  Noise artifact is: Mild misregistration  Coronary calcium  score is 552, which places the patient in the 92nd percentile for age and sex matched control.  Coronary arteries: Normal coronary origins.  Right dominance.  Right Coronary Artery: Minimal mixed atherosclerotic plaque in the proximal RCA, <25% stenosis. Moderate diffuse atherosclerotic plaque in the mid RCA, 50-69% stenosis. Mild mixed atherosclerotic plaque in the mid-distal RCA, 25-49% stenosis. Patent RPDA, RPLA.  Left Main Coronary Artery: No detectable plaque or stenosis.  Left Anterior Descending Coronary Artery: Minimal mixed atherosclerotic plaque in the proximal LAD, <25% stenosis. Mild mixed atherosclerotic plaque in the ostial and proximal first diagonal, 25-49% stenosis.  Left Circumflex Artery: Minimal scattered mixed atherosclerotic plaque in the proximal LCx, <25% stenosis.  Aorta: Normal size, 28 mm at the mid ascending aorta (level of the PA bifurcation) measured double oblique.  Aortic Valve: No calcifications. Mildly thickened aortic valve, tricuspid.  Mitral valve: Moderately thickened mitral valve.  Other findings:  Normal pulmonary vein drainage into the left atrium.  Normal left atrial appendage without thrombus.  Normal size of the pulmonary artery.  Please see separate report from [facility] for non-cardiac findings.  IMPRESSION: 1. Moderate CAD in the mid RCA, 50-69% stenosis,  CADRADS 3. CT FFR will be performed and reported separately.  2. Coronary calcium  score is 552, which places the patient in the 92nd percentile for age and sex matched control.  3. Normal coronary origins with right dominance.  RECOMMENDATIONS: CAD-RADS 3. Moderate stenosis. Consider symptom-guided anti-ischemic pharmacotherapy as well as risk factor modification per guideline directed care. Additional analysis with CT FFR will be submitted.  Electronically Signed: By: Soyla Merck M.D. On: 08/16/2022 13:55     ______________________________________________________________________________________________     Recent Labs: 06/06/2024: ALT 31; Hemoglobin 13.3; Platelets 217; Pro Brain Natriuretic Peptide 464.0 07/03/2024: BUN 10; Creatinine, Ser 0.83; Potassium 3.8; Sodium 142  Recent Lipid Panel    Component Value Date/Time   CHOL 170 11/30/2022 0851   TRIG 82 11/30/2022 0851   HDL 100 11/30/2022 0851   CHOLHDL 1.7 11/30/2022 0851   LDLCALC 55 11/30/2022 0851    History of Present Illness    79 year old female with a history of moderate nonobstructive CAD, mitral valve regurgitation, hypertension, hyperlipidemia, and former tobacco use  who presents for follow-up related to CAD and lower extremity edema.   Previously seen by Dr. Delford. ETT in 2018 was normal. She later established with Dr. Francyne in the setting of chest pain.  She was last seen in the office on 10/17/2022 and reported chest pain at rest, overall atypical.  Coronary CT angiogram in 07/2022 revealed coronary calcium  score of 552 (92nd percentile), moderate nonobstructive CAD, negative FFR.  She presented to the ED on 06/06/2024 in the setting of increased bilateral lower extremity edema, chest discomfort radiating to her left arm. Chest x-ray was unremarkable, BNP was mildly elevated, troponin was negative x 2.  She was started on Lasix  and advised to follow-up with cardiology as an outpatient.  She was last seen  in the office on  06/19/2024 and reported improvement in lower extremity swelling with Lasix .  Repeat echocardiogram in 07/2024 showed EF 60 to 65%, normal LV function, no RWMA, normal RV systolic function, mild mitral valve regurgitation.    She presents today for follow-up. Since her last visit she has been stable from a cardiac standpoint.  She notes occasional lightheadedness after taking her BP medication in the morning.  She has not been checking her BP at home.  Denies any palpitations, presyncope or syncope.  Denies symptoms concerning for angina.  She also reports occasional wheezing, she does have a history of asthma.  She sees an allergy  and asthma specialist.  Denies dyspnea.  Her PCP increased her Lasix  to 40 mg daily.  Potassium was increased to 20 mill equivalents daily.  She has trace nonpitting bilateral lower extremity edema, overall improved.    Home Medications    Current Outpatient Medications  Medication Sig Dispense Refill   acetaminophen (TYLENOL) 500 MG tablet Take 1,000 mg by mouth every 6 (six) hours as needed for moderate pain or headache.     albuterol  (VENTOLIN  HFA) 108 (90 Base) MCG/ACT inhaler Inhale 2 puffs into the lungs every 6 (six) hours as needed for wheezing or shortness of breath. 1 each 3   Ascorbic Acid (VITAMIN C) 1000 MG tablet Take 1,000 mg by mouth daily.     aspirin EC 81 MG tablet Take 81 mg by mouth daily.     atenolol (TENORMIN) 25 MG tablet Take 25 mg by mouth daily.     atenolol (TENORMIN) 50 MG tablet Take 1 tablet by mouth daily.     bismuth subsalicylate (PEPTO BISMOL) 262 MG/15ML suspension Take 30 mLs by mouth every 6 (six) hours as needed for diarrhea or loose stools.     budesonide-glycopyrrolate -formoterol (BREZTRI) 160-9-4.8 MCG/ACT AERO inhaler Inhale 2 puffs into the lungs 2 (two) times daily.     cetirizine (ZYRTEC) 10 MG tablet Take 10 mg by mouth daily as needed for allergies.      cholecalciferol (VITAMIN D3) 25 MCG (1000 UNIT) tablet  Take 1,000 Units by mouth daily.     clobetasol cream (TEMOVATE) 0.05 % SMARTSIG:sparingly Topical Twice Daily     cromolyn  (OPTICROM ) 4 % ophthalmic solution Place 2 drops into both eyes 4 (four) times daily as needed. 10 mL 5   diclofenac  sodium (VOLTAREN ) 1 % GEL Apply 2 g topically daily as needed (for knee pain).   3   fluticasone (FLONASE) 50 MCG/ACT nasal spray Place into both nostrils daily.     furosemide  (LASIX ) 20 MG tablet Take 1 tablet daily (Patient taking differently: 40 mg daily. Take 1 tablet daily) 90 tablet 3   montelukast  (SINGULAIR ) 10 MG  tablet Take 1 tablet (10 mg total) by mouth daily. 90 tablet 1   Multiple Vitamin (MULTIVITAMIN WITH MINERALS) TABS tablet Take 1 tablet by mouth daily.     olmesartan  (BENICAR ) 20 MG tablet Take 1 tablet (20 mg total) by mouth daily. 90 tablet 3   omeprazole  (PRILOSEC) 40 MG capsule Take 1 capsule (40 mg total) by mouth 2 (two) times daily before a meal. Take twice daily for 4 weeks and then reduce to once daily. 90 capsule 3   potassium chloride (MICRO-K) 10 MEQ CR capsule Take 10 mEq by mouth daily. (Patient taking differently: Take 20 mEq by mouth daily.)     RESTASIS 0.05 % ophthalmic emulsion 1 drop 2 (two) times daily.     rosuvastatin  (CRESTOR ) 20 MG tablet TAKE 1 TABLET(20 MG) BY MOUTH DAILY 90 tablet 3   triamcinolone  cream (KENALOG ) 0.1 % Apply 1 Application topically 2 (two) times daily. 30 g 0   No current facility-administered medications for this visit.     Review of Systems   She denies chest pain, palpitations, dyspnea, pnd, orthopnea, n, v, dizziness, syncope, weight gain, or early satiety. All other systems reviewed and are otherwise negative except as noted above.   Physical Exam    VS:  BP 112/64 (BP Location: Left Arm, Patient Position: Sitting, Cuff Size: Normal)   Pulse 70   Ht 5' 3 (1.6 m)   Wt 145 lb (65.8 kg)   SpO2 95%   BMI 25.69 kg/m  GEN: Well nourished, well developed, in no acute distress. HEENT:  normal. Neck: Supple, no JVD, carotid bruits, or masses. Cardiac: RRR, no murmurs, rubs, or gallops. No clubbing, cyanosis, trace nonpitting bilateral lower extremity edema.  Radials/DP/PT 2+ and equal bilaterally.  Respiratory:  Respirations regular and unlabored, clear to auscultation bilaterally. GI: Soft, nontender, nondistended, BS + x 4. MS: no deformity or atrophy. Skin: warm and dry, no rash. Neuro:  Strength and sensation are intact. Psych: Normal affect.  Accessory Clinical Findings    ECG personally reviewed by me today -    - no EKG in office today. Lab Results  Component Value Date   WBC 7.0 06/06/2024   HGB 13.3 06/06/2024   HCT 39.2 06/06/2024   MCV 96.8 06/06/2024   PLT 217 06/06/2024   Lab Results  Component Value Date   CREATININE 0.83 07/03/2024   BUN 10 07/03/2024   NA 142 07/03/2024   K 3.8 07/03/2024   CL 102 07/03/2024   CO2 25 07/03/2024   Lab Results  Component Value Date   ALT 31 06/06/2024   AST 41 06/06/2024   ALKPHOS 79 06/06/2024   BILITOT 0.6 06/06/2024   Lab Results  Component Value Date   CHOL 170 11/30/2022   HDL 100 11/30/2022   LDLCALC 55 11/30/2022   TRIG 82 11/30/2022   CHOLHDL 1.7 11/30/2022    No results found for: HGBA1C  Assessment & Plan    1. CAD: Coronary CT angiogram in 07/2022 revealed coronary calcium  score of 552 (92nd percentile), moderate nonobstructive CAD, negative FFR. Stable with no anginal symptoms. No indication for ischemic evaluation. Continue aspirin, atenolol, olmesartan , Crestor .   2. Bilateral lower extremity edema/mitral valve regurgitation: Improved with increased Lasix  dosing. Repeat echocardiogram in 07/2024 showed EF 60 to 65%, normal LV function, no RWMA, normal RV systolic function, mild mitral valve regurgitation.  Overall reassuring.  PCP increased her Lasix  to 40 mg daily.  She has trace nonpitting bilateral lower extremity edema,  generally euvolemic well compensated on exam. Continue Lasix  at  current dose.  Continue to monitor for progressive swelling, increased shortness of breath, weight gain, orthopnea.  She does had lab checks per PCP, will request copy.  Consider repeat echocardiogram in 2 to 3 years for monitoring of mitral valve regurgitation, sooner if clinically indicated.  3. Hypertension: BP well controlled.  She does note occasional lightheadedness after taking her BP medication, this typically last for an hour or 2.  She has not been checking her BP at home. Encouraged her to monitor BP and report SBP consider less than 100 mmHg.  If she has evidence of symptomatic hypotension, consider de-escalation of 5 hypertensive regimen.  For now, continue current antihypertensive regimen.   4. Hyperlipidemia: LDL was 59 in 12/2023.  Continue Crestor .    5. Disposition: Follow-up in 3 months, sooner if needed.      Damien JAYSON Braver, NP 08/19/2024, 1:10 PM       [1]  Allergies Allergen Reactions   Sulfa Antibiotics Rash   "

## 2024-11-16 ENCOUNTER — Ambulatory Visit: Admitting: Nurse Practitioner

## 2025-02-12 ENCOUNTER — Ambulatory Visit: Admitting: Family Medicine
